# Patient Record
Sex: Female | Born: 1965 | Hispanic: Yes | Marital: Married | Smoking: Never smoker
Health system: Southern US, Community
[De-identification: ages and names within clinical notes are randomized; demographics above are authoritative.]

## PROBLEM LIST (undated history)

## (undated) DIAGNOSIS — E785 Hyperlipidemia, unspecified: Secondary | ICD-10-CM

## (undated) DIAGNOSIS — E119 Type 2 diabetes mellitus without complications: Secondary | ICD-10-CM

## (undated) DIAGNOSIS — M1990 Unspecified osteoarthritis, unspecified site: Secondary | ICD-10-CM

## (undated) DIAGNOSIS — Z789 Other specified health status: Secondary | ICD-10-CM

## (undated) DIAGNOSIS — K219 Gastro-esophageal reflux disease without esophagitis: Secondary | ICD-10-CM

## (undated) HISTORY — DX: Gastro-esophageal reflux disease without esophagitis: K21.9

## (undated) HISTORY — DX: Unspecified osteoarthritis, unspecified site: M19.90

## (undated) HISTORY — DX: Hyperlipidemia, unspecified: E78.5

## (undated) HISTORY — DX: Other specified health status: Z78.9

## (undated) HISTORY — PX: NO PAST SURGERIES: SHX2092

---

## 2003-08-20 ENCOUNTER — Ambulatory Visit (HOSPITAL_COMMUNITY): Admission: RE | Admit: 2003-08-20 | Discharge: 2003-08-20 | Payer: Self-pay | Admitting: *Deleted

## 2004-01-16 ENCOUNTER — Inpatient Hospital Stay (HOSPITAL_COMMUNITY): Admission: AD | Admit: 2004-01-16 | Discharge: 2004-01-18 | Payer: Self-pay | Admitting: Obstetrics & Gynecology

## 2005-02-02 ENCOUNTER — Emergency Department (HOSPITAL_COMMUNITY): Admission: EM | Admit: 2005-02-02 | Discharge: 2005-02-02 | Payer: Self-pay | Admitting: Emergency Medicine

## 2006-02-21 ENCOUNTER — Ambulatory Visit (HOSPITAL_COMMUNITY): Admission: RE | Admit: 2006-02-21 | Discharge: 2006-02-21 | Payer: Self-pay | Admitting: *Deleted

## 2006-03-18 ENCOUNTER — Ambulatory Visit (HOSPITAL_COMMUNITY): Admission: RE | Admit: 2006-03-18 | Discharge: 2006-03-18 | Payer: Self-pay | Admitting: Obstetrics & Gynecology

## 2006-03-20 ENCOUNTER — Emergency Department (HOSPITAL_COMMUNITY): Admission: EM | Admit: 2006-03-20 | Discharge: 2006-03-20 | Payer: Self-pay | Admitting: Emergency Medicine

## 2006-07-28 ENCOUNTER — Ambulatory Visit: Payer: Self-pay | Admitting: Obstetrics and Gynecology

## 2006-07-28 ENCOUNTER — Inpatient Hospital Stay (HOSPITAL_COMMUNITY): Admission: AD | Admit: 2006-07-28 | Discharge: 2006-07-28 | Payer: Self-pay | Admitting: Obstetrics and Gynecology

## 2006-08-04 ENCOUNTER — Inpatient Hospital Stay (HOSPITAL_COMMUNITY): Admission: AD | Admit: 2006-08-04 | Discharge: 2006-08-04 | Payer: Self-pay | Admitting: Gynecology

## 2006-08-09 ENCOUNTER — Inpatient Hospital Stay (HOSPITAL_COMMUNITY): Admission: AD | Admit: 2006-08-09 | Discharge: 2006-08-11 | Payer: Self-pay | Admitting: Obstetrics and Gynecology

## 2006-08-09 ENCOUNTER — Ambulatory Visit: Payer: Self-pay | Admitting: Obstetrics and Gynecology

## 2008-02-15 ENCOUNTER — Ambulatory Visit (HOSPITAL_COMMUNITY): Admission: RE | Admit: 2008-02-15 | Discharge: 2008-02-15 | Payer: Self-pay | Admitting: Family Medicine

## 2009-02-28 ENCOUNTER — Ambulatory Visit (HOSPITAL_COMMUNITY): Admission: RE | Admit: 2009-02-28 | Discharge: 2009-02-28 | Payer: Self-pay | Admitting: Obstetrics & Gynecology

## 2010-04-01 ENCOUNTER — Ambulatory Visit (HOSPITAL_COMMUNITY): Admission: RE | Admit: 2010-04-01 | Discharge: 2010-04-01 | Payer: Self-pay | Admitting: Obstetrics & Gynecology

## 2011-04-12 ENCOUNTER — Other Ambulatory Visit: Payer: Self-pay | Admitting: Family Medicine

## 2011-04-12 ENCOUNTER — Other Ambulatory Visit: Payer: Self-pay | Admitting: Obstetrics & Gynecology

## 2011-04-12 DIAGNOSIS — Z1231 Encounter for screening mammogram for malignant neoplasm of breast: Secondary | ICD-10-CM

## 2011-04-23 ENCOUNTER — Ambulatory Visit (HOSPITAL_COMMUNITY): Payer: Self-pay

## 2011-04-28 ENCOUNTER — Ambulatory Visit (HOSPITAL_COMMUNITY)
Admission: RE | Admit: 2011-04-28 | Discharge: 2011-04-28 | Disposition: A | Discharge status: Home / Self Care | Payer: Self-pay | Source: Ambulatory Visit | Attending: Family Medicine | Admitting: Family Medicine

## 2011-04-28 DIAGNOSIS — Z1231 Encounter for screening mammogram for malignant neoplasm of breast: Secondary | ICD-10-CM

## 2012-06-14 ENCOUNTER — Other Ambulatory Visit (HOSPITAL_COMMUNITY): Payer: Self-pay | Admitting: Physician Assistant

## 2012-06-14 DIAGNOSIS — Z1231 Encounter for screening mammogram for malignant neoplasm of breast: Secondary | ICD-10-CM

## 2012-06-27 ENCOUNTER — Ambulatory Visit (HOSPITAL_COMMUNITY)
Admission: RE | Admit: 2012-06-27 | Discharge: 2012-06-27 | Disposition: A | Discharge status: Home / Self Care | Payer: Self-pay | Source: Ambulatory Visit | Attending: Physician Assistant | Admitting: Physician Assistant

## 2012-06-27 DIAGNOSIS — Z1231 Encounter for screening mammogram for malignant neoplasm of breast: Secondary | ICD-10-CM

## 2013-06-20 ENCOUNTER — Other Ambulatory Visit (HOSPITAL_COMMUNITY): Payer: Self-pay | Admitting: Nurse Practitioner

## 2013-06-20 DIAGNOSIS — Z1231 Encounter for screening mammogram for malignant neoplasm of breast: Secondary | ICD-10-CM

## 2013-07-05 ENCOUNTER — Ambulatory Visit (HOSPITAL_COMMUNITY)
Admission: RE | Admit: 2013-07-05 | Discharge: 2013-07-05 | Disposition: A | Discharge status: Home / Self Care | Payer: Self-pay | Source: Ambulatory Visit | Attending: Nurse Practitioner | Admitting: Nurse Practitioner

## 2013-07-05 DIAGNOSIS — Z1231 Encounter for screening mammogram for malignant neoplasm of breast: Secondary | ICD-10-CM

## 2013-11-24 ENCOUNTER — Encounter: Payer: Self-pay | Admitting: Internal Medicine

## 2013-11-24 ENCOUNTER — Ambulatory Visit: Payer: No Typology Code available for payment source | Attending: Internal Medicine | Admitting: Internal Medicine

## 2013-11-24 VITALS — BP 102/65 | HR 67 | Temp 98.1°F | Ht 61.0 in | Wt 181.0 lb

## 2013-11-24 DIAGNOSIS — Z0000 Encounter for general adult medical examination without abnormal findings: Secondary | ICD-10-CM

## 2013-11-24 DIAGNOSIS — M7918 Myalgia, other site: Secondary | ICD-10-CM

## 2013-11-24 DIAGNOSIS — K029 Dental caries, unspecified: Secondary | ICD-10-CM | POA: Insufficient documentation

## 2013-11-24 DIAGNOSIS — K047 Periapical abscess without sinus: Principal | ICD-10-CM

## 2013-11-24 DIAGNOSIS — M62830 Muscle spasm of back: Secondary | ICD-10-CM | POA: Insufficient documentation

## 2013-11-24 DIAGNOSIS — H539 Unspecified visual disturbance: Secondary | ICD-10-CM | POA: Insufficient documentation

## 2013-11-24 NOTE — Progress Notes (Unsigned)
Patient ID: Elaine Burch, female   DOB: 05-10-1966, 48 y.o.   MRN: [id number]   HPI: Elaine Burch is a 48 y.o. female presenting on 11/24/2013 presenting to establish care. Please see ROS.     No past medical history on file.  No past surgical history on file.  No current outpatient prescriptions on file.   No current facility-administered medications for this visit.    No Known Allergies  No family history on file.  History   Social History  . Marital Status: Married    Spouse Name: N/A    Number of Children: N/A  . Years of Education: N/A   Occupational History  . Not on file.   Social History Main Topics  . Smoking status: Never Smoker   . Smokeless tobacco: Not on file  . Alcohol Use: Not on file  . Drug Use: Not on file  . Sexual Activity: Not on file   Other Topics Concern  . Works as a Chartered certified accountant   Social History Narrative  . No narrative on file    Review of Systems  Review of Systems  Constitutional: Negative for fever, chills, diaphoresis, activity change, appetite change and fatigue.  HENT: Negative for ear pain, nosebleeds, congestion, facial swelling, rhinorrhea, neck pain, neck stiffness and ear discharge.  Eyes: Negative for pain, discharge, redness, itching and visual disturbance.  Respiratory: Negative for cough, choking, chest tightness, shortness of breath, wheezing and stridor.  Cardiovascular: Negative for chest pain, palpitations and leg swelling.  Gastrointestinal: Negative for abdominal distention, vomiting, diarrhea  + occasional consitpation Genitourinary: Negative for dysuria, urgency, frequency, hematuria, flank pain, decreased urine volume, difficulty urinating and dyspareunia.  Musculoskeletal:+ for back and shoulder pain (b/l) and right buttock pain, joint swelling, arthralgias or gait problem.  Neurological: Negative for dizziness, tremors, seizures, syncope, facial asymmetry, speech difficulty, weakness,  light-headedness, numbness and headaches.  Hematological: Negative for adenopathy. Does not bruise/bleed easily.  Psychiatric/Behavioral: Negative for hallucinations, behavioral problems, confusion, dysphoric mood   Objective:  BP 102/65  Pulse 67  Temp(Src) 98.1 F (36.7 C) (Oral)  Ht 5\' 1"  (1.549 m)  Wt 181 lb (82.101 kg)  BMI 34.22 kg/m2  LMP 11/05/2013 Filed Weights   11/24/13 1008  Weight: 181 lb (82.101 kg)     Physical Exam  Constitutional: obese- No distress. HENT: Normocephalic. External right and left ear normal. Oropharynx is clear and moist.  Eyes: Conjunctivae and EOM are normal. PERRLA, no scleral icterus.  Neck: Normal ROM. Neck supple. No JVD. No tracheal deviation. No thyromegaly.  CVS: RRR, S1/S2 +, no murmurs, no gallops, no carotid bruit.  Pulmonary: Effort and breath sounds normal, no stridor, rhonchi, wheezes, rales.  Abdominal: Soft. BS +,  no distension, tenderness, rebound or guarding.  Musculoskeletal: Normal range of motion. No edema and no tenderness.  Neuro: Alert. Normal reflexes, muscle tone coordination. No cranial nerve deficit. Skin: Skin is warm and dry. No rash noted. Not diaphoretic. No erythema. No pallor.  Psychiatric: Normal mood and affect. Behavior, judgment, thought content normal.   No results found for this basename: WBC,  HGB,  HCT,  MCV,  PLT   No results found for this basename: CREATININE,  BUN,  NA,  K,  CL,  CO2    No results found for this basename: HGBA1C   Lipid Panel  No results found for this basename: chol,  trig,  hdl,  cholhdl,  vldl,  ldlcalc        Patient Active Problem  List   Diagnosis Date Noted  . Muscle spasm of back 11/24/2013  . Dental caries 11/24/2013  . Visual changes 11/24/2013     Preventative Medicine:  No health maintenance topics applied.  No health maintenance topics applied. Mammogram: completed in past yr Colonoscopy : Pap Smear : completed in past yr Flu vaccine: will  administer  LAB WORK: ordered Metabolic panel: CBC:  Vitamin D : Lipid Panel: TSH: PSA:    Assessment and plan:  dental carries - dental referral  Muscle spasm of back/ right gluteal/ and pain in shoulders - PT, Alleve, Ortho referral for possible injection for right gluteal region  Visual changes - opthal referral  Routine history and physical examination of adult    Return in about 3 months (around 02/21/2014).   Elaine Odea, MD

## 2013-11-24 NOTE — Patient Instructions (Signed)
Alleve- for muscle pains  Calambres y espasmos musculares  (Muscle Cramps and Spasms)  Se denominan calambres y espasmos musculares cuando los msculos se comprimen. Generalmente mejoran en unos minutos. Los calambres musculares son dolorosos. Son ms fuertes y duran ms que los espasmos musculares. Los espasmos pueden o no ser dolorosos. Pueden durar algunos segundos o mucho ms. CUIDADOS EN EL HOGAR   Beba gran cantidad de lquido para mantener el pis (orina) de tono claro o amarillo plido.  [place], estire y relaje el msculo.  Aplique una toalla hmeda, una almohadilla trmica o moje con agua tibia los msculos acalambrados.  Coloque una bolsa de hielo sobre la zona si le duele o est inflamada.  Ponga el hielo en una bolsa plstica.  Colquese una toalla entre la piel y la bolsa de hielo.  Deje el hielo en el lugar durante 15 a 20 minutos, 3 a 4 veces por da.  Slo tome los medicamentos que le indique el mdico. SOLICITE AYUDA DE INMEDIATO SI:  Los calambres o espasmos empeoran, ocurren con ms frecuencia o no mejoran con [employer].  ASEGRESE DE QUE:   Comprende estas instrucciones.  Controlar su enfermedad.  Solicitar ayuda de inmediato si no mejora o si empeora. Document Released: 12/17/2008 Document Revised: 01/15/2013 [facility] Patient Information 2014 [place], [place].

## 2013-11-26 ENCOUNTER — Ambulatory Visit: Payer: No Typology Code available for payment source | Attending: Internal Medicine

## 2013-11-26 DIAGNOSIS — H539 Unspecified visual disturbance: Secondary | ICD-10-CM

## 2013-11-26 DIAGNOSIS — M62830 Muscle spasm of back: Secondary | ICD-10-CM

## 2013-11-26 DIAGNOSIS — K047 Periapical abscess without sinus: Principal | ICD-10-CM

## 2013-11-26 DIAGNOSIS — Z0000 Encounter for general adult medical examination without abnormal findings: Secondary | ICD-10-CM

## 2013-11-26 DIAGNOSIS — M7918 Myalgia, other site: Secondary | ICD-10-CM

## 2013-11-26 DIAGNOSIS — K029 Dental caries, unspecified: Secondary | ICD-10-CM

## 2013-11-26 LAB — TSH: TSH: 3.552 u[IU]/mL (ref 0.350–4.500)

## 2013-11-26 LAB — COMPLETE METABOLIC PANEL WITH GFR
ALT: 12 U/L (ref 0–35)
AST: 17 U/L (ref 0–37)
Albumin: 4.2 g/dL (ref 3.5–5.2)
Alkaline Phosphatase: 62 U/L (ref 39–117)
BUN: 7 mg/dL (ref 6–23)
CO2: 24 mEq/L (ref 19–32)
Calcium: 8.7 mg/dL (ref 8.4–10.5)
Chloride: 107 mEq/L (ref 96–112)
Creat: 0.43 mg/dL — ABNORMAL LOW (ref 0.50–1.10)
GFR, Est African American: >89 mL/min
GFR, Est Non African American: >89 mL/min
Glucose, Bld: 95 mg/dL (ref 70–99)
Potassium: 4.4 mEq/L (ref 3.5–5.3)
Sodium: 139 mEq/L (ref 135–145)
Total Bilirubin: 0.3 mg/dL (ref 0.2–1.2)
Total Protein: 7.3 g/dL (ref 6.0–8.3)

## 2013-11-26 LAB — LIPID PANEL
Cholesterol: 159 mg/dL (ref 0–200)
HDL: 52 mg/dL (ref >39)
LDL Cholesterol: 75 mg/dL (ref 0–99)
Total CHOL/HDL Ratio: 3.1 Ratio
Triglycerides: 159 mg/dL — ABNORMAL HIGH (ref <150)
VLDL: 32 mg/dL (ref 0–40)

## 2013-11-27 LAB — VITAMIN D 25 HYDROXY (VIT D DEFICIENCY, FRACTURES): Vit D, 25-Hydroxy: 10 ng/mL — ABNORMAL LOW (ref 30–89)

## 2013-12-06 ENCOUNTER — Ambulatory Visit: Payer: No Typology Code available for payment source | Attending: Internal Medicine

## 2013-12-06 DIAGNOSIS — M5380 Other specified dorsopathies, site unspecified: Secondary | ICD-10-CM | POA: Insufficient documentation

## 2013-12-06 DIAGNOSIS — IMO0001 Reserved for inherently not codable concepts without codable children: Secondary | ICD-10-CM | POA: Insufficient documentation

## 2013-12-06 DIAGNOSIS — M2550 Pain in unspecified joint: Secondary | ICD-10-CM | POA: Insufficient documentation

## 2013-12-07 ENCOUNTER — Telehealth: Payer: Self-pay | Admitting: Emergency Medicine

## 2013-12-07 MED ORDER — VITAMIN D (ERGOCALCIFEROL) 1.25 MG (50000 UNIT) PO CAPS: 50000.0000 [IU] | ORAL_CAPSULE | ORAL | Status: DC

## 2013-12-07 NOTE — Progress Notes (Signed)
Quick Note:  Please let patient know that they are deficient in Vit D and prescribe 50,000 U Vit D weekly for 4 wks x 4 refills.  If they are unable to afford it, they can take Vit D2 OTC 2000 U daily. ______

## 2013-12-07 NOTE — Telephone Encounter (Signed)
Lab results with medication instructions given to pt via [place] interpretor Pt verbalized understanding. Pt to pick script at our pharmacy

## 2013-12-12 ENCOUNTER — Ambulatory Visit: Payer: No Typology Code available for payment source | Admitting: Rehabilitation

## 2013-12-13 ENCOUNTER — Ambulatory Visit: Payer: No Typology Code available for payment source | Admitting: Physical Therapy

## 2013-12-17 ENCOUNTER — Ambulatory Visit: Payer: No Typology Code available for payment source | Admitting: Physical Therapy

## 2013-12-19 ENCOUNTER — Ambulatory Visit: Payer: No Typology Code available for payment source | Admitting: Physical Therapy

## 2013-12-26 ENCOUNTER — Ambulatory Visit: Payer: No Typology Code available for payment source | Admitting: Physical Therapy

## 2013-12-27 ENCOUNTER — Encounter: Payer: No Typology Code available for payment source | Admitting: Physical Therapy

## 2013-12-27 ENCOUNTER — Ambulatory Visit: Payer: No Typology Code available for payment source | Admitting: Physical Therapy

## 2014-01-01 ENCOUNTER — Ambulatory Visit: Payer: No Typology Code available for payment source | Admitting: Physical Therapy

## 2014-01-03 ENCOUNTER — Ambulatory Visit: Payer: No Typology Code available for payment source | Attending: Internal Medicine | Admitting: Physical Therapy

## 2014-01-03 DIAGNOSIS — M2550 Pain in unspecified joint: Secondary | ICD-10-CM | POA: Insufficient documentation

## 2014-01-03 DIAGNOSIS — M5380 Other specified dorsopathies, site unspecified: Secondary | ICD-10-CM | POA: Insufficient documentation

## 2014-01-03 DIAGNOSIS — IMO0001 Reserved for inherently not codable concepts without codable children: Secondary | ICD-10-CM | POA: Insufficient documentation

## 2014-01-07 ENCOUNTER — Encounter: Payer: No Typology Code available for payment source | Admitting: Physical Therapy

## 2014-01-08 ENCOUNTER — Ambulatory Visit: Payer: No Typology Code available for payment source | Admitting: Rehabilitation

## 2014-01-10 ENCOUNTER — Encounter: Payer: No Typology Code available for payment source | Admitting: Rehabilitation

## 2014-01-15 ENCOUNTER — Ambulatory Visit: Payer: No Typology Code available for payment source | Admitting: Physical Therapy

## 2014-01-15 ENCOUNTER — Encounter: Payer: No Typology Code available for payment source | Admitting: Rehabilitation

## 2014-01-17 ENCOUNTER — Encounter: Payer: No Typology Code available for payment source | Admitting: Rehabilitation

## 2014-01-17 ENCOUNTER — Encounter: Payer: No Typology Code available for payment source | Admitting: Physical Therapy

## 2014-01-17 ENCOUNTER — Ambulatory Visit: Payer: No Typology Code available for payment source | Admitting: Rehabilitation

## 2014-05-09 ENCOUNTER — Ambulatory Visit: Payer: Self-pay | Attending: Internal Medicine

## 2014-06-24 ENCOUNTER — Other Ambulatory Visit (HOSPITAL_COMMUNITY): Payer: Self-pay | Admitting: Nurse Practitioner

## 2014-06-24 DIAGNOSIS — Z1231 Encounter for screening mammogram for malignant neoplasm of breast: Secondary | ICD-10-CM

## 2014-07-11 ENCOUNTER — Ambulatory Visit (HOSPITAL_COMMUNITY)
Admission: RE | Admit: 2014-07-11 | Discharge: 2014-07-11 | Disposition: A | Discharge status: Home / Self Care | Payer: Self-pay | Source: Ambulatory Visit | Attending: Nurse Practitioner | Admitting: Nurse Practitioner

## 2014-07-11 DIAGNOSIS — Z1231 Encounter for screening mammogram for malignant neoplasm of breast: Secondary | ICD-10-CM

## 2014-11-13 ENCOUNTER — Ambulatory Visit: Payer: Self-pay | Attending: Internal Medicine | Admitting: Internal Medicine

## 2014-11-13 ENCOUNTER — Encounter: Payer: Self-pay | Admitting: Internal Medicine

## 2014-11-13 VITALS — BP 99/65 | HR 63 | Temp 98.1°F | Resp 16 | Ht 61.0 in | Wt 182.0 lb

## 2014-11-13 DIAGNOSIS — Z0000 Encounter for general adult medical examination without abnormal findings: Secondary | ICD-10-CM

## 2014-11-13 DIAGNOSIS — Z01419 Encounter for gynecological examination (general) (routine) without abnormal findings: Secondary | ICD-10-CM | POA: Insufficient documentation

## 2014-11-13 DIAGNOSIS — M25552 Pain in left hip: Secondary | ICD-10-CM | POA: Insufficient documentation

## 2014-11-13 DIAGNOSIS — M549 Dorsalgia, unspecified: Secondary | ICD-10-CM | POA: Insufficient documentation

## 2014-11-13 NOTE — Progress Notes (Signed)
Patient ID: Elaine Burch, female   DOB: 13-Sep-1966, 49 y.o.   MRN: [id number]  CC: annual physical  HPI: Elaine Burch is a 49 y.o. female here today for a follow up visit.  Patient has no past medical history.  Last mammogram was 07/2014.  No concerns with breast. No vaginal discharge, itch, odor, lesions. Has a bump on inside of vagina that is non painful, pruritic, or draining.   Pain on left side of hip, more painful with laying on side at night. Has had physical therapy for one year. Has tried ibuprofen for the pain. Upper back pain (in b/w) shoulders. Pain present for several months. No injury noted.   Patient has No headache, No chest pain, No abdominal pain - No Nausea, No new weakness tingling or numbness, No Cough - SOB.  No Known Allergies History reviewed. No pertinent past medical history. Current Outpatient Prescriptions on File Prior to Visit  Medication Sig Dispense Refill  . Vitamin D, Ergocalciferol, (DRISDOL) 50000 UNITS CAPS capsule Take 1 capsule (50,000 Units total) by mouth every 7 (seven) days. (Patient not taking: Reported on 11/13/2014) 4 capsule 2   No current facility-administered medications on file prior to visit.   Family History  Problem Relation Age of Onset  . Diabetes Mother   . Cancer Father    History   Social History  . Marital Status: Married    Spouse Name: N/A  . Number of Children: N/A  . Years of Education: N/A   Occupational History  . Not on file.   Social History Main Topics  . Smoking status: Never Smoker   . Smokeless tobacco: Not on file  . Alcohol Use: Not on file  . Drug Use: Not on file  . Sexual Activity: Not on file   Other Topics Concern  . Not on file   Social History Narrative    Review of Systems: All systems negative except for notes in HPI   Objective:   Filed Vitals:   11/13/14 1534  BP: 99/65  Pulse: 63  Temp: 98.1 F (36.7 C)  Resp: 16    Physical Exam: Constitutional:  Patient appears well-developed and well-nourished. No distress. HENT: Normocephalic, atraumatic, External right and left ear normal. Oropharynx is clear and moist.  Eyes: Conjunctivae and EOM are normal. PERRLA, no scleral icterus. Neck: Normal ROM. Neck supple. No JVD. No tracheal deviation. No thyromegaly. CVS: RRR, S1/S2 +, no murmurs, no gallops, no carotid bruit.  Pulmonary: Effort and breath sounds normal, no stridor, rhonchi, wheezes, rales.  Abdominal: Soft. BS +,  no distension, tenderness, rebound or guarding.  Musculoskeletal: Normal range of motion. No edema and no tenderness.  Lymphadenopathy: No lymphadenopathy noted, cervical, inguinal or axillary Neuro: Alert. Normal reflexes, muscle tone coordination. No cranial nerve deficit. Skin: Skin is warm and dry. No rash noted. Not diaphoretic. No erythema. No pallor. Psychiatric: Normal mood and affect. Behavior, judgment, thought content normal. Physical Exam  Pulmonary/Chest: Right breast exhibits no mass. Left breast exhibits no mass.  Genitourinary: Vagina normal and uterus normal. No breast tenderness or discharge. Cervix exhibits no motion tenderness, no discharge and no friability. Right adnexum displays no tenderness. Left adnexum displays no tenderness.  Lymphadenopathy:       Right: No inguinal adenopathy present.       Left: No inguinal adenopathy present.     No results found for: WBC, HGB, HCT, MCV, PLT Lab Results  Component Value Date   CREATININE 0.43* 11/26/2013   BUN  7 11/26/2013   NA 139 11/26/2013   K 4.4 11/26/2013   CL 107 11/26/2013   CO2 24 11/26/2013    No results found for: HGBA1C Lipid Panel     Component Value Date/Time   CHOL 159 11/26/2013 0903   TRIG 159* 11/26/2013 0903   HDL 52 11/26/2013 0903   CHOLHDL 3.1 11/26/2013 0903   VLDL 32 11/26/2013 0903   LDLCALC 75 11/26/2013 0903       Assessment and plan:   Jaydan was seen today for establish care.  Diagnoses and all orders for  this visit:  Annual physical exam Orders: -     Cytology - PAP Heard -     Cervicovaginal ancillary only -     Lipid panel; Future -     COMPLETE METABOLIC PANEL WITH GFR; Future -     CBC; Future -     Hemoglobin A1C; Future -     HIV antibody (with reflex); Future -     RPR; Future   Due to language barrier, an interpreter was present during the history-taking and subsequent discussion (and for part of the physical exam) with this patient.  Return if symptoms worsen or fail to improve.        Chari Manning, NP-C [facility] [phone] 11/13/2014, 4:04 PM

## 2014-11-13 NOTE — Patient Instructions (Signed)
Clarinda (Health Maintenance) Adoptar un estilo de vida saludable y recibir atencin preventiva pueden ser de suma utilidad para promover la salud y Musician. Hable con el mdico para saber cul es el esquema de exmenes peridicos adecuado para usted. Esta es una buena oportunidad para Teacher, adult education peridicamente al mdico sobre cmo prevenir enfermedades y [place] sano. Entre cada control mdico, hay muchas cosas que puede hacer por s solo. Los expertos han investigado mucho acerca de los cambios en el estilo de vida y las medidas preventivas que muy probablemente preserven su salud. Consulte al mdico para obtener ms informacin. EL PESO Y LA DIETA  Consuma una dieta saludable.  Incluya abundante cantidad de verduras, frutas, productos lcteos descremados y protenas magras.  No coma muchos alimentos con alto contenido de grasas slidas, azcares agregados o sal.  Realice actividad fsica con regularidad. Esta es una de las cosas ms importantes que puede hacer por su salud.  La [employer] de las personas adultas deben hacer actividad fsica durante por lo menos 140mnutos semanales. El ejercicio debe aumentar la frecuencia cardaca y hNature conservation officersudar (ejercicio de intensidad moderada).  Adems, casi todos los adultos deben hacer ejercicios de fortalecimiento al mToysRusveces por semana como complemento del ejercicio de iDante Mantenga un peso saludable.  El ndice de masa corporal ([facility] es una medida que puede usarse para identificar posibles problemas relacionados con el peso. Este ofrece un clculo estimativo de la gAir traffic controlleren funcin del peso y lAgricultural consultant El mdico puede determinar su IEncino Outpatient Surgery Center LLCy ayudarlo a aScience writery mTheatre managerun peso saludable.  Para las mujeres mayores de 20aos:  Un ISurgicare Surgical Associates Of Mahwah LLCmenor de 18,5 se considera bajo peso.  Un IBaptist Health Medical Center - North Little Rockentre 18,5 y 24,9 es normal.  Un ITerrell State Hospitalentre 25 y 29,9 es sobrepeso.  Un IMC de 30 o ms se considera  obesidad. Controlar los niveles de colesterol y lpidos en la sangre  Debe comenzar a hacerse anlisis de sangre para controlar los nAscutneyde lpidos y colesterol a partir de lSedalia y repetir estos estudios cada 5aos.  Tal vez deba someterse a controles de los niveles de colesterol con ms frecuencia si:  Tiene los niveles de lpidos o colesterol elevados.  Es mayor de 50aos.  Tiene un riesgo alto de tener enfermedades cardacas. DETECCIN DE CNCER  Cncer de pulmn  Se recomienda realizar exmenes de deteccin de cncer de pulmn a las personas adultas que tienen entre 581y 80aos, y corren riesgo de tBest boycncer de pulmn debido a sus antecedentes de tabaquismo.  Se recomienda realizar una tomografa computarizada anual de baja dosis de los pulmones a las personas que:  Siguen fumando.  Hayan dejado de fumar en los ltimos 15aos.  Hayan fumado un paquete diario durante 30aos. El ndice ao-paquete equivale a fumar, en promedio, un paquete de cigarrillos diario durante 1ao.  Debe seguir realizndose estudios de deteccin anual hasta que hayan pasado 15aos desde que dej de fumar.  Estos estudios deben suspenderse si tiene un problema de salud que le impedira recibir tratamiento para eScience writerde pulmn. Cncer de mama  Ponga en prctica la "autoconciencia de las mamas". Esto significa reconocer la apariencia normal de las mamas y cSt. [place]airs[place]  Adems implica realizarse autoexmenes peridicos de lJohnson & Johnson Informe al mdico si hay algn cambio, sin importar cun pequeo sea.  Si tiene entre 20 y 30aos, un mdico debe hacerle un examen clnico de las mamas cada 1 a 316aoscomo parte del  examen habitual de salud.  Si es mayor de 40aos, debe Electrical engineer un examen clnico de las Microsoft. Tambin debe considerar la posibilidad de realizarse una radiografa de las mamas ([place]) todos los [place].  Si tiene antecedentes familiares de cncer de  mama, hable con el mdico para saber si debe someterse a un estudio gentico.  Si tiene un riesgo alto de Animal nutritionist de mama, hable con el mdico para saber si debe hacerse una resonancia magntica y [employer].  Se recomienda una evaluacin del gen del cncer de mama (BRCA) a las mujeres que tengan familiares con tumores malignos relacionados con el BRCA. Los tumores malignos relacionados con elBRCA incluyen:  [employer].  Los [employer].  Los tumores malignos del peritoneo.  Los resultados de la evaluacin determinarn la necesidad de asesoramiento gentico y de [place] de BRCA1 y BRCA2. Cncer de cuello uterino Ya no se recomiendan los exmenes plvicos de rutina para la deteccin del cncer de cuello uterino en las mujeres que no estn embarazadas que son consideradas sujetos de bajo riesgo de Best boy cncer de los rganos de la pelvis (ovarios, tero y vagina) y que no tienen sntomas. Tal vez sea necesario realizar un examen plvico si tiene sntomas, incluidos aquellos que estn asociados con infecciones en la pelvis. Pregntele al mdico si un examen plvico de deteccin es adecuado para usted.   El Papanicolau es la prueba de deteccin del cncer de cuello uterino para las mujeres que podran Engineer, production.  Si le han realizado una histerectoma por un problema que no era cncer u otra enfermedad que podra causar cncer, ya no necesitar realizarse pruebas de Papanicolaou.  Si es mayor de 39aos y los resultados de las pruebas de Papanicolaou han sido normales durante los ltimos 10aos, ya no es necesario que se realice estos estudios.  Si ha recibido un tratamiento para el cncer de cuello uterino o para una enfermedad que podra causar cncer, necesitar realizarse una prueba de Papanicolaou y controles durante al menos 26 aos de concluido el [place].  Si ya no se realiza pruebas de Papanicolaou, debe evaluar sus factores de riesgo si estos  se modifican (por ejemplo, tiene un nuevo compaero sexual). Esta situacin puede influir en la necesidad de que se someta nuevamente a estudios de deteccin.  Algunas mujeres sufren problemas mdicos que aumentan la probabilidad de tener cncer de cuello uterino. En este caso, el mdico podr indicarle que se someta a exmenes de deteccin y pruebas de Papanicolaou con ms frecuencia.  La prueba del virus del Engineer, technical sales (VPH) es un estudio adicional que puede usarse para la deteccin del cncer de cuello uterino. Esta prueba busca la presencia del virus que puede causar cambios celulares en el cuello del tero. Las clulas que se recolectan durante la prueba de Papanicolaou pueden usarse para el VPH.  La prueba del VPH puede usarse para examinar a las Cendant Corporation de 46TKP. Someterse a International aid/development worker del VPH puede prolongar el [employer] las pruebas de Papanicolaou normales de tres a Product manager.  Adems, se debe realizar la prueba del VPH para evaluar a las mujeres de cualquier edad cuyos resultados del Papanicolau no sean claros.  Despus de los 30aos, las mujeres deben realizarse pruebas del VPH con la misma frecuencia que las pruebas de Papanicolau. Cncer colorrectal  Es posible detectar este tipo de cncer y, a menudo, es posible prevenirlo.  Generalmente, los estudios de deteccin de  rutina del cncer colorrectal empiezan a hacerse a partir de los 50aos y continan hasta los 75aos.  El mdico puede recomendar que se los haga antes, si tiene factores de riesgo de cncer de colon.  Adems, el mdico puede recomendar que use un kit de prueba casera para hallar sangre oculta en la materia fecal.  Es posible que se use una pequea cmara en el extremo de un tubo para examinar directamente el colon (sigmoidoscopa o colonoscopa), con el fin de detectar las formas ms incipientes de cncer colorrectal.  Generalmente, los estudios de deteccin de rutina se realizan  a partir de los 50aos.  El examen directo del colon debe repetirse cada 5 a 10aos hasta cumplir 75aos. Sin embargo, tal vez deba someterse a la prueba de deteccin con ms frecuencia si se encuentran formas incipientes de plipos precancerosos o pequeos tumores. Cncer de piel  Revsese la piel peridicamente desde los dedos de los pies hasta la cabeza.  Informe al mdico si aparecen nuevos lunares o si nota cambios en los que ya tiene, especialmente en la forma o el color.  Tambin notifquele si tiene un lunar cuyo tamao es ms grande que el de la goma de un lpiz.  Use siempre pantalla solar. Aplique pantalla solar tantas veces como pueda a lo largo del da.  Protjase usando mangas y pantalones largos, un sombrero de ala ancha y gafas para el sol todo el ao, siempre que est al aire libre. ENFERMEDADES CARDACAS, DIABETES E HIPERTENSIN ARTERIAL   Debe controlarse la presin arterial al menos cada 1 o 2aos. La hipertensin arterial causa enfermedades cardacas y aumenta el riesgo de ictus.  Si tiene entre 55 y 79aos, consulte al mdico si debe tomar aspirina para prevenir ictus.  Hgase anlisis peridicos para la diabetes, que incluyen la toma de una muestra de sangre para controlar el nivel de azcar en la sangre mientras est en ayunas.  Si su peso es normal y tiene un riesgo bajo de tener diabetes, hgase este anlisis una vez cada tres aos, despus de los 45aos.  Si tiene sobrepeso y un riesgo alto de sufrir diabetes, considere la posibilidad de hacerse los anlisis a una edad ms temprana o con ms frecuencia. PREVENCIN DE INFECCIONES  Hepatitis B  Si tiene un riesgo ms alto de tener hepatitisB, debe hacerse anlisis de deteccin de este virus. Se considera que tiene un alto riesgo de hepatitis B si:  Naci en un pas donde la hepatitisB es frecuente. Pregntele al mdico qu pases son considerados de alto riesgo.  Sus padres nacieron en un pas de alto  riesgo, y usted no recibi la vacuna contra la hepatitis B.  Tiene VIH o sida.  Usa agujas para inyectarse drogas.  Convive con una persona que tiene hepatitisB.  Tuvo relaciones sexuales con una persona que tiene hepatitisB.  Recibe tratamiento de hemodilisis.  Toma ciertos medicamentos para cuadros clnicos tales como cncer, trasplante de  

## 2014-11-13 NOTE — Progress Notes (Signed)
Pt is here today for a pap smear and a physical. Pt reports having chronic upper back pain.

## 2014-11-15 LAB — CERVICOVAGINAL ANCILLARY ONLY
Chlamydia: NEGATIVE
Neisseria Gonorrhea: NEGATIVE
WET PREP (BD AFFIRM): NEGATIVE
Wet Prep (BD Affirm): NEGATIVE
Wet Prep (BD Affirm): POSITIVE — AB

## 2014-11-15 LAB — CYTOLOGY - PAP

## 2014-11-19 ENCOUNTER — Telehealth: Payer: Self-pay | Admitting: *Deleted

## 2014-11-19 ENCOUNTER — Ambulatory Visit: Payer: Self-pay | Attending: Internal Medicine

## 2014-11-19 DIAGNOSIS — Z0000 Encounter for general adult medical examination without abnormal findings: Secondary | ICD-10-CM

## 2014-11-19 DIAGNOSIS — Z23 Encounter for immunization: Secondary | ICD-10-CM

## 2014-11-19 LAB — CBC
HCT: 31.3 % — ABNORMAL LOW (ref 36.0–46.0)
Hemoglobin: 9.2 g/dL — ABNORMAL LOW (ref 12.0–15.0)
MCH: 20 pg — AB (ref 26.0–34.0)
MCHC: 29.4 g/dL — ABNORMAL LOW (ref 30.0–36.0)
MCV: 67.9 fL — AB (ref 78.0–100.0)
Platelets: 299 10*3/uL (ref 150–400)
RBC: 4.61 MIL/uL (ref 3.87–5.11)
RDW: 19.1 % — ABNORMAL HIGH (ref 11.5–15.5)
WBC: 4.7 10*3/uL (ref 4.0–10.5)

## 2014-11-19 MED ORDER — FLUCONAZOLE 150 MG PO TABS: 150.0000 mg | ORAL_TABLET | Freq: Every day | ORAL | Status: DC

## 2014-11-19 NOTE — Telephone Encounter (Signed)
-----   Message from Lance Bosch, NP sent at 11/15/2014 10:56 PM EST ----- Positive for yeast. Please send diflucan 150 mg to take PO once. Send 1 tablet with 1 refill. May repeat in one week if no improvement. Negative for STD's

## 2014-11-19 NOTE — Telephone Encounter (Signed)
-----   Message from Lance Bosch, NP sent at 11/18/2014  6:35 PM EST ----- Patient pap is negative for malignancies. Will repeat in 3 years.

## 2014-11-19 NOTE — Telephone Encounter (Signed)
Rx send to [facility] Left voice message to return call. (message in [place])

## 2014-11-28 ENCOUNTER — Telehealth: Payer: Self-pay | Admitting: *Deleted

## 2014-11-28 MED ORDER — FERROUS SULFATE 325 (65 FE) MG PO TABS: 325.0000 mg | ORAL_TABLET | Freq: Every day | ORAL | Status: DC

## 2014-11-28 NOTE — Telephone Encounter (Signed)
Rx send to [place] Left voice message to return call

## 2014-11-28 NOTE — Telephone Encounter (Signed)
-----   Message from Lance Bosch, NP sent at 11/25/2014 10:46 AM EST ----- Iron deficiency anemia. Please advise patient to take ferrous sulfate 325 mg PO once daily to help with anemia. May send prescription with 2 refills.

## 2014-11-29 ENCOUNTER — Ambulatory Visit: Payer: Self-pay | Attending: Internal Medicine

## 2014-11-29 ENCOUNTER — Telehealth: Payer: Self-pay | Admitting: General Practice

## 2014-11-29 NOTE — Telephone Encounter (Signed)
Patient request lab results from last visit. Please contact patient to assist.

## 2014-12-02 ENCOUNTER — Telehealth: Payer: Self-pay | Admitting: Internal Medicine

## 2014-12-02 ENCOUNTER — Other Ambulatory Visit: Payer: Self-pay | Admitting: *Deleted

## 2014-12-02 MED ORDER — FERROUS SULFATE 325 (65 FE) MG PO TABS: 325.0000 mg | ORAL_TABLET | Freq: Every day | ORAL | Status: DC

## 2014-12-02 NOTE — Telephone Encounter (Signed)
Rx reorder pharmacy change to [facility]

## 2014-12-02 NOTE — Telephone Encounter (Signed)
Pt aware of lab results        Notes Recorded by Lance Bosch, NP on 11/25/2014 at 10:46 AM Iron deficiency anemia. Please advise patient to take ferrous sulfate 325 mg PO once daily to help with anemia. May send prescription with 2 refills.

## 2014-12-02 NOTE — Telephone Encounter (Signed)
Patient called stating that her Iron medication was not sent to the pharmacy patient states that she called and the pharmacy stated that they did not have any medications ready for her.

## 2015-06-25 ENCOUNTER — Ambulatory Visit: Payer: Self-pay | Attending: Internal Medicine

## 2015-08-06 ENCOUNTER — Other Ambulatory Visit: Payer: Self-pay

## 2015-08-06 DIAGNOSIS — Z1231 Encounter for screening mammogram for malignant neoplasm of breast: Secondary | ICD-10-CM

## 2015-08-22 ENCOUNTER — Ambulatory Visit
Admission: RE | Admit: 2015-08-22 | Discharge: 2015-08-22 | Disposition: A | Discharge status: Home / Self Care | Payer: No Typology Code available for payment source | Source: Ambulatory Visit

## 2015-08-22 DIAGNOSIS — Z1231 Encounter for screening mammogram for malignant neoplasm of breast: Secondary | ICD-10-CM

## 2016-08-03 ENCOUNTER — Other Ambulatory Visit: Payer: Self-pay | Admitting: Internal Medicine

## 2016-08-17 ENCOUNTER — Other Ambulatory Visit (HOSPITAL_COMMUNITY): Payer: Self-pay | Admitting: *Deleted

## 2016-08-17 DIAGNOSIS — N644 Mastodynia: Secondary | ICD-10-CM

## 2016-09-09 ENCOUNTER — Ambulatory Visit
Admission: RE | Admit: 2016-09-09 | Discharge: 2016-09-09 | Disposition: A | Discharge status: Home / Self Care | Payer: No Typology Code available for payment source | Source: Ambulatory Visit | Attending: Obstetrics and Gynecology | Admitting: Obstetrics and Gynecology

## 2016-09-09 ENCOUNTER — Ambulatory Visit (HOSPITAL_COMMUNITY)
Admission: RE | Admit: 2016-09-09 | Discharge: 2016-09-09 | Disposition: A | Discharge status: Home / Self Care | Payer: Self-pay | Source: Ambulatory Visit | Attending: Obstetrics and Gynecology | Admitting: Obstetrics and Gynecology

## 2016-09-09 ENCOUNTER — Encounter (HOSPITAL_COMMUNITY): Payer: Self-pay

## 2016-09-09 VITALS — BP 150/100 | Temp 98.4°F | Ht 61.0 in | Wt 189.6 lb

## 2016-09-09 DIAGNOSIS — N644 Mastodynia: Secondary | ICD-10-CM

## 2016-09-09 DIAGNOSIS — Z1239 Encounter for other screening for malignant neoplasm of breast: Secondary | ICD-10-CM

## 2016-09-09 NOTE — Patient Instructions (Signed)
Explained breast self awareness to [place]. Patient did not need a Pap smear today due to last Pap smear and HPV typing was 25/07/2015. Let her know [facility] will cover Pap smears and HPV typing every 5 years unless has a history of abnormal Pap smears. Told patient will follow up with the Health Department on questionable abnormal results and follow up with her when her next Pap smear is due. Referred patient to the [facility] for diagnostic mammogram and possible bilateral breast ultrasounds. Appointment scheduled for Thursday, September 09, 2016 at 0830.  Elaine Burch verbalized understanding.  Dorma Altman, Arvil [place], RN 9:02 AM

## 2016-09-09 NOTE — Progress Notes (Signed)
Complaints of bilateral lower and inner breast pain x 1 year when touched. Patient rates the pain at a 4 out of 10.  Pap Smear: Pap smear not completed today. Last Pap smear was 11/13/2014 at [facility] and normal with negative HPV. Per patient has a history of an abnormal Pap smear in 2007 that she thinks she had a colposcopy completed. Per patient has had at least three normal Pap smears since the abnormal Pap smear. Last Pap smear result is in EPIC. Previous results are not in EPIC. Patient stated had Pap smears completed at [facility] Department. Will follow up with the [facility] on results.  Physical exam: Breasts Breasts symmetrical. No skin abnormalities left breast. Skin discoloration observed on right outer breast and axilla. Per patient the skin discoloration is a birth mark. No nipple retraction bilateral breasts. No nipple discharge bilateral breasts. No lymphadenopathy. No lumps palpated bilateral breasts. Complaints of bilateral lower and inner breast tenderness on exam. Referred patient to the [place] for diagnostic mammogram and possible bilateral breast ultrasounds. Appointment scheduled for Thursday, September 09, 2016 at 0830.        Pelvic/Bimanual No Pap smear completed today since last Pap smear and HPV typing was 11/13/2014. Pap smear not indicated per BCCCP guidelines.   Smoking History: Patient has never smoked.  Patient Navigation: Patient education provided. Access to services provided for patient through [facility] program. Spanish interpreter provided.   Used Spanish interpreter [employer] from [place].

## 2016-09-10 ENCOUNTER — Encounter (HOSPITAL_COMMUNITY): Payer: Self-pay | Admitting: *Deleted

## 2017-08-22 ENCOUNTER — Other Ambulatory Visit: Payer: Self-pay | Admitting: Obstetrics and Gynecology

## 2017-08-22 DIAGNOSIS — Z1231 Encounter for screening mammogram for malignant neoplasm of breast: Secondary | ICD-10-CM

## 2017-09-29 ENCOUNTER — Ambulatory Visit
Admission: RE | Admit: 2017-09-29 | Discharge: 2017-09-29 | Disposition: A | Discharge status: Home / Self Care | Payer: No Typology Code available for payment source | Source: Ambulatory Visit | Attending: Obstetrics and Gynecology | Admitting: Obstetrics and Gynecology

## 2017-09-29 ENCOUNTER — Encounter (HOSPITAL_COMMUNITY): Payer: Self-pay

## 2017-09-29 ENCOUNTER — Ambulatory Visit (HOSPITAL_COMMUNITY)
Admission: RE | Admit: 2017-09-29 | Discharge: 2017-09-29 | Disposition: A | Discharge status: Home / Self Care | Payer: Self-pay | Source: Ambulatory Visit | Attending: Obstetrics and Gynecology | Admitting: Obstetrics and Gynecology

## 2017-09-29 VITALS — BP 128/84 | Temp 98.1°F

## 2017-09-29 DIAGNOSIS — Z1231 Encounter for screening mammogram for malignant neoplasm of breast: Secondary | ICD-10-CM

## 2017-09-29 DIAGNOSIS — Z1239 Encounter for other screening for malignant neoplasm of breast: Secondary | ICD-10-CM

## 2017-09-29 NOTE — Patient Instructions (Signed)
Explained breast self awareness with Bonnee Okonski. Patient did not need a Pap smear today due to last Pap smear and HPV typing was 11/13/2014. Let her know BCCCP will cover Pap smears and HPV typing every 5 years unless has a history of abnormal Pap smears. Referred patient to the [place] for a screening mammogram. Appointment scheduled for Thursday, September 29, 2017 at 1040. Let patient know the [facility] will follow up with her within the next couple weeks with results of mammogram by letter or phone. Miquela Drennan verbalized understanding.  Annabell Oconnor, Arvil Chaco, RN 9:53 AM

## 2017-09-29 NOTE — Progress Notes (Signed)
Complaints of right upper breast pain two times per month x 6 months. Patient rates the pain at a 4 out of 10 when occurs. No complaints of breast pain today.  Pap Smear: Pap smear not completed today. Last Pap smear was 11/13/2014 at [facility] and normal with negative HPV. Per patient has a history of an abnormal Pap smear 12 years ago that a colposcopy was completed for follow up. Per patient all Pap smears have been normal since colposcopy and has had at least three normal Pap smears in a row. Last Pap smear result is in Epic.  Physical exam: Breasts Breasts symmetrical. No skin abnormalities bilateral breasts. No nipple retraction bilateral breasts. No nipple discharge bilateral breasts. No lymphadenopathy. No lumps palpated bilateral breasts. No complaints of pain or tenderness on exam. Referred patient to the [place] for a screening mammogram. Spoke with Dr. Enriqueta Shutter about patients complaint of right upper breast pain two times per month and that patient has no complaints of breast pain today. Dr. Enriqueta Shutter confirmed a screening mammogram is appropriate for patient. Appointment scheduled for Thursday, September 29, 2017 at 1040.        Pelvic/Bimanual No Pap smear completed today since last Pap smear and HPV typing was 11/13/2014. Pap smear not indicated per BCCCP guidelines.   Smoking History: Patient has never smoked.  Patient Navigation: Patient education provided. Access to services provided for patient through [facility] program. Spanish interpreter provided.  Used Spanish interpreter [employer] from [place].

## 2017-09-30 ENCOUNTER — Encounter (HOSPITAL_COMMUNITY): Payer: Self-pay | Admitting: *Deleted

## 2018-09-12 ENCOUNTER — Other Ambulatory Visit: Payer: Self-pay | Admitting: Obstetrics and Gynecology

## 2018-09-12 DIAGNOSIS — Z1231 Encounter for screening mammogram for malignant neoplasm of breast: Secondary | ICD-10-CM

## 2018-10-09 ENCOUNTER — Other Ambulatory Visit (HOSPITAL_COMMUNITY): Payer: Self-pay | Admitting: *Deleted

## 2018-10-09 DIAGNOSIS — N644 Mastodynia: Secondary | ICD-10-CM

## 2018-11-07 ENCOUNTER — Ambulatory Visit: Payer: No Typology Code available for payment source

## 2018-11-07 ENCOUNTER — Encounter (HOSPITAL_COMMUNITY): Payer: Self-pay | Admitting: *Deleted

## 2018-11-07 ENCOUNTER — Ambulatory Visit (HOSPITAL_COMMUNITY)
Admission: RE | Admit: 2018-11-07 | Discharge: 2018-11-07 | Disposition: A | Discharge status: Home / Self Care | Payer: No Typology Code available for payment source | Source: Ambulatory Visit | Attending: Obstetrics and Gynecology | Admitting: Obstetrics and Gynecology

## 2018-11-07 ENCOUNTER — Encounter (HOSPITAL_COMMUNITY): Payer: Self-pay

## 2018-11-07 ENCOUNTER — Ambulatory Visit
Admission: RE | Admit: 2018-11-07 | Discharge: 2018-11-07 | Disposition: A | Discharge status: Home / Self Care | Payer: Self-pay | Source: Ambulatory Visit | Attending: Obstetrics and Gynecology | Admitting: Obstetrics and Gynecology

## 2018-11-07 ENCOUNTER — Other Ambulatory Visit: Payer: Self-pay | Admitting: Obstetrics and Gynecology

## 2018-11-07 VITALS — BP 138/74 | Ht 62.0 in | Wt 182.0 lb

## 2018-11-07 DIAGNOSIS — Z1231 Encounter for screening mammogram for malignant neoplasm of breast: Secondary | ICD-10-CM

## 2018-11-07 DIAGNOSIS — Z1239 Encounter for other screening for malignant neoplasm of breast: Secondary | ICD-10-CM

## 2018-11-07 NOTE — Progress Notes (Signed)
No complaints today.   Pap Smear: Pap smear not completed today. Last Pap smear was 11/13/2014 at [facility] and normal with negative HPV. Per patient has a history of an abnormal Pap smear 13 years ago that a colposcopy was completed for follow up. Per patient all Pap smears have been normal since colposcopy and has had at least three normal Pap smears in a row. Last Pap smear result is in Epic.  Physical exam: Breasts Breasts symmetrical. No skin abnormalities bilateral breasts. No nipple retraction bilateral breasts. No nipple discharge bilateral breasts. No lymphadenopathy. No lumps palpated bilateral breasts. No complaints of pain or tenderness on exam. Referred patient to the [place] for a screening mammogram. Appointment scheduled for Tuesday, November 07, 2018 at 0930.        Pelvic/Bimanual No Pap smear completed today since last Pap smear and HPV typing was 11/13/2014. Pap smear not indicated per BCCCP guidelines.   Smoking History: Patient has never smoked.  Patient Navigation: Patient education provided. Access to services provided for patient through [facility] program. Spanish interpreter provided.   Colorectal Cancer Screening: Per patient has never had a colonoscopy completed. No complaints today. FIT Test will be mailed to patient to complete and return to [facility].  Breast and Cervical Cancer Risk Assessment: Patient has no family history of breast cancer, known genetic mutations, or radiation treatment to the chest before age 51. Per patient has a history of cervical dysplasia. Patient has no history of being immunocompromised or DES exposure in-utero.  Risk Assessment    Risk Scores      11/07/2018   Last edited by: Loletta Parish, RN   5-year risk: 0.8 %   Lifetime risk: 7.7 %         Used Spanish interpreter Rudene Anda from [place].

## 2018-11-07 NOTE — Patient Instructions (Addendum)
Explained breast self awareness with Elaine Burch. Patient did not need a Pap smear today due to last Pap smear and HPV typing was 11/13/2014. Let her know BCCCP will cover Pap smears and HPV typing every 5 years unless has a history of abnormal Pap smears. Referred patient to the [place] for a screening mammogram. Appointment scheduled for Tuesday, November 07, 2018 at 0930. Let patient know the [facility] Center will follow up with her within the next couple weeks with results of mammogram by letter or phone. Cullen Kann verbalized understanding.  Demaree Liberto, Arvil Chaco, RN 9:50 AM

## 2018-11-27 ENCOUNTER — Other Ambulatory Visit: Payer: Self-pay | Admitting: Nurse Practitioner

## 2018-11-27 DIAGNOSIS — Z124 Encounter for screening for malignant neoplasm of cervix: Secondary | ICD-10-CM

## 2018-11-27 NOTE — Progress Notes (Signed)
Patient: Elaine Burch           Date of Birth: March 13, 1966           MRN: [id number] Visit Date: 11/27/2018 PCP: Tresa Garter, MD  Cervical Cancer Screening Do you smoke?: No Have you ever had or been told you have an allergy to latex products?: No Marital status: Married Date of last pap smear: 2-5 yrs ago Date of last menstrual period: 11/22/18 Number of pregnancies: 4 Number of births: 4 Have you ever had any of the following? Hysterectomy: No Tubal ligation (tubes tied): No Abnormal bleeding: No Abnormal pap smear: No Venereal warts: No A sex partner with venereal warts: No A high risk* sex partner: No  Cervical Exam  Abnormal Observations: Moderate amount of dark blood.  On day 5 of menses.  Has not used tampons. Recommendations: Follow pap results Repap according to ASCCP guidelines.      Patient's History Patient Active Problem List   Diagnosis Date Noted  . Muscle spasm of back 11/24/2013  . Dental caries 11/24/2013  . Visual changes 11/24/2013   No past medical history on file.  Family History  Problem Relation Age of Onset  . Diabetes Mother   . Cancer Father     No past surgical history on file. Social History   Occupational History  . Not on file  Tobacco Use  . Smoking status: Never Smoker  . Smokeless tobacco: Never Used  Substance and Sexual Activity  . Alcohol use: No  . Drug use: No  . Sexual activity: Yes    Birth control/protection: None

## 2018-11-27 NOTE — Addendum Note (Signed)
Addended by: Loletta Parish on: 11/27/2018 08:29 PM   Modules accepted: Orders

## 2018-12-04 LAB — CYTOLOGY - PAP: Diagnosis: NEGATIVE

## 2018-12-26 ENCOUNTER — Other Ambulatory Visit: Payer: Self-pay

## 2018-12-28 LAB — SPECIMEN STATUS REPORT

## 2018-12-28 LAB — FECAL OCCULT BLOOD, IMMUNOCHEMICAL: FECAL OCCULT BLD: NEGATIVE

## 2019-01-08 ENCOUNTER — Encounter (HOSPITAL_COMMUNITY): Payer: Self-pay

## 2019-01-11 ENCOUNTER — Encounter (HOSPITAL_COMMUNITY): Payer: Self-pay

## 2019-01-11 NOTE — Progress Notes (Signed)
Mailed FIT test result letter to patient on 01/11/2019. Negative FIT test result.

## 2019-05-03 ENCOUNTER — Ambulatory Visit: Payer: No Typology Code available for payment source | Admitting: Family Medicine

## 2019-05-23 ENCOUNTER — Other Ambulatory Visit: Payer: Self-pay

## 2019-05-23 ENCOUNTER — Encounter: Payer: Self-pay | Admitting: Family Medicine

## 2019-05-23 ENCOUNTER — Ambulatory Visit: Payer: Self-pay | Attending: Family Medicine | Admitting: Family Medicine

## 2019-05-23 DIAGNOSIS — G8929 Other chronic pain: Secondary | ICD-10-CM

## 2019-05-23 DIAGNOSIS — M25512 Pain in left shoulder: Secondary | ICD-10-CM

## 2019-05-23 DIAGNOSIS — Z789 Other specified health status: Secondary | ICD-10-CM

## 2019-05-23 MED ORDER — IBUPROFEN 600 MG PO TABS: 600.0000 mg | ORAL_TABLET | Freq: Three times a day (TID) | ORAL | 2 refills | Status: DC | PRN

## 2019-05-23 MED ORDER — CYCLOBENZAPRINE HCL 5 MG PO TABS: 5.0000 mg | ORAL_TABLET | Freq: Three times a day (TID) | ORAL | 1 refills | Status: DC | PRN

## 2019-05-23 NOTE — Progress Notes (Signed)
Arm pain and she hurt it at her job and can not lift anything. Per pt she feels like her shoulder is rubbing against each other. Left shoulder and left arm.   Think she started Menopause or premenopause   Wants to get lab work  From her waist going done she sometimes feels like she's getting anxiety,  Headache only in the back part of her head  Per patient she is having sleeping problems  Per pt she needs a note from the doctor to say her limits at work she can do due to her shoulder pain.

## 2019-05-23 NOTE — Progress Notes (Signed)
Virtual Visit via Telephone Note  I connected with Elaine Burch on 05/27/19 at 10:50 AM EDT by telephone and verified that I am speaking with the correct person using two identifiers.   I discussed the limitations, risks, security and privacy concerns of performing an evaluation and management service by telephone and the availability of in person appointments. I also discussed with the patient that there may be a patient responsible charge related to this service. The patient expressed understanding and agreed to proceed.  Patient Location: Home Provider Location: Office at [place] Others participating in call: Call initiated by Emilio Aspen, RMA who obtained Spanish speaking interpreter through [facility] interpretation systems due to language barrier   History of Present Illness:       53 year old female last seen in the office on 11/13/2014 for annual well exam who wishes to reestablish care with this office.  She states that earlier this year she hurt her left shoulder and arm at work.  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  Patient states that she hurt her shoulder in April while moving some chairs which were heavy when she was trying to clean and vacuum.  She states that she was seen at the emergency department at [facility] in May regarding her shoulder pain.  She was placed on medication, ibuprofen, muscle relaxant and prednisone to help with her shoulder pain however she feels that she had a reaction to the prednisone as she had a burning sensation in her mouth after taking this medication for a few days.  She would like to have a refill of the ibuprofen and muscle relaxant.  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   She reports that she continues to have difficulty with pain in the left shoulder, difficulty sleeping on the left shoulder secondary to increased pain and patient with decreased ability to lift her left arm above shoulder level.  Pain ranges from a 6-10 but usually is about a 6-8 on a 0-to-10 scale with 10 being the worst possible pain.  Ibuprofen does help decrease the pain to a 6 or less.  Pain is worse when she is doing any type of movement involving her left arm especially if she tries to reach behind her such as when she is getting dressed or to reach things above her such as objects on a shelf.  She also cannot hold heavy objects or lift heavy objects with her left hand/arm.      Patient also wishes to know if she can make an appointment for her annual well exam as patient reports that she has not had recent mammogram, pelvic exam and no recent labs and she would like to have these done as well.  She denies any current abdominal or pelvic pain.  No nausea/vomiting/diarrhea or constipation and no blood in the stool and no dark stools.  No shortness of breath or cough.  No chest pain or palpitations.  No breast pain or breast tenderness, no abnormalities on self breast exam.  No headaches or dizziness.  No urinary frequency or dysuria.  She does have some fatigue.                 Past Medical History:  Diagnosis  Date  . Known health problems: none     Past Surgical History:  Procedure Laterality Date  . NO PAST SURGERIES      Family History  Problem Relation Age of Onset  . Diabetes Mother   . Cancer Father     Social History   Tobacco Use  . Smoking status: Never Smoker  . Smokeless tobacco: Never Used  Substance Use Topics  . Alcohol use: No  . Drug use: No     No Known Allergies     Observations/Objective: No vital signs or physical exam conducted as visit was done via telephone  Assessment and Plan: 1. Chronic left shoulder pain Patient with complaint of chronic pain in  the left shoulder since April when she moved some heavy chairs as part of her job.  Patient states that her job involves cleaning, vacuuming and mopping and she had to move the chairs in order to vacuum and had sudden onset of pain in her left shoulder at that time.  She followed up at the emergency department at [facility] in May.  Chest x-ray from patient's 02/28/2019 ED visit was reported as showing no bony abnormality therefore suspect the patient with rotator cuff pathology for which she will be referred to orthopedics for further evaluation and treatment.  New prescription sent to patient's pharmacy for ibuprofen and Flexeril which patient states do help with the pain. - AMB referral to orthopedics - ibuprofen (ADVIL) 600 MG tablet; Take 1 tablet (600 mg total) by mouth every 8 (eight) hours as needed for moderate pain. Take after eating  Dispense: 60 tablet; Refill: 2 - cyclobenzaprine (FLEXERIL) 5 MG tablet; Take 1 tablet (5 mg total) by mouth 3 (three) times daily as needed for muscle spasms.  Dispense: 30 tablet; Refill: 1  Follow Up Instructions:Return for she would like a well female exam in 4-6 weeks.    I discussed the assessment and treatment plan with the patient. The patient was provided an opportunity to ask questions and all were answered. The patient agreed with the plan and demonstrated an understanding of the instructions.   The patient was advised to call back or seek an in-person evaluation if the symptoms worsen or if the condition fails to improve as anticipated.  I provided 31 minutes of non-face-to-face time during this encounter.   Antony Blackbird, MD

## 2019-05-29 ENCOUNTER — Telehealth: Payer: Self-pay | Admitting: Family Medicine

## 2019-05-29 NOTE — Telephone Encounter (Signed)
Advised patient no letter is in the chart for her. Please follow up

## 2019-05-29 NOTE — Telephone Encounter (Signed)
Patient called stating that she was expecting a letter with her work restrictions. Patient states her employer is requesting the work restriction letter due to her going back to work. Please follow up.

## 2019-05-30 NOTE — Telephone Encounter (Signed)
She was seen for telephone visit and referred to Orthopedics and she was instructed to ask Orthopedics about her work restrictions as I could not really determine her difficulty using her arm without an in-person visit but I can write for her to continue her current job restrictions until she is evaluated by Orthopedics

## 2019-05-30 NOTE — Telephone Encounter (Signed)
Patient would like provider to please write her a restrictions letter for her employer informing them with that she can and can not do due to her pain she discussed with provider during her last visit. Per pt the letter was not with her AVS when she was last in the office and she need it to give it to her work.

## 2019-05-31 NOTE — Telephone Encounter (Signed)
Staff had Caren Griffins from [place] [employer] called patient and informed her with what provider stated and she verbalized understanding.

## 2019-06-05 ENCOUNTER — Ambulatory Visit: Payer: Self-pay | Attending: Family Medicine

## 2019-06-05 ENCOUNTER — Other Ambulatory Visit: Payer: Self-pay

## 2019-06-22 ENCOUNTER — Other Ambulatory Visit: Payer: Self-pay | Admitting: Family Medicine

## 2019-06-22 ENCOUNTER — Ambulatory Visit: Payer: Self-pay | Attending: Family Medicine | Admitting: Family Medicine

## 2019-06-22 ENCOUNTER — Ambulatory Visit: Payer: No Typology Code available for payment source | Admitting: Pharmacist

## 2019-06-22 ENCOUNTER — Encounter: Payer: Self-pay | Admitting: Family Medicine

## 2019-06-22 ENCOUNTER — Other Ambulatory Visit: Payer: Self-pay

## 2019-06-22 VITALS — BP 128/77 | HR 80 | Temp 98.3°F | Resp 18 | Ht 62.0 in | Wt 185.0 lb

## 2019-06-22 DIAGNOSIS — Z0000 Encounter for general adult medical examination without abnormal findings: Secondary | ICD-10-CM

## 2019-06-22 DIAGNOSIS — Z23 Encounter for immunization: Secondary | ICD-10-CM

## 2019-06-22 DIAGNOSIS — Z124 Encounter for screening for malignant neoplasm of cervix: Secondary | ICD-10-CM

## 2019-06-22 DIAGNOSIS — N898 Other specified noninflammatory disorders of vagina: Secondary | ICD-10-CM

## 2019-06-22 MED ORDER — FLUCONAZOLE 150 MG PO TABS: 150.0000 mg | ORAL_TABLET | Freq: Once | ORAL | 1 refills | Status: AC

## 2019-06-22 NOTE — Progress Notes (Signed)
Patient presents for vaccination against tetanus and influenza per orders of Dr. Chapman Fitch. Consent given. Counseling provided. No contraindications exists. Vaccine administered without incident.

## 2019-06-22 NOTE — Patient Instructions (Signed)
Perimenopausia Perimenopause  Se llama perimenopausia a los perodos normales de la vida que tienen lugar antes y despus del cese definitivo de los perodos menstruales ([place]). La perimenopausia puede comenzar entre 2y 8aos antes de la menopausia y suele durar 1ao despus de la menopausia. Durante la perimenopausia, los ovarios pueden o no producir vulos. Cules son las causas? La perimenopausia es causada por un cambio natural en los niveles hormonales que se produce a medida que las mujeres envejecen. Qu incrementa el riesgo? Es ms probable que la perimenopausia comience a una edad ms temprana si usted tiene Armed forces training and education officer o ha recibido ciertos tratamientos, como los siguientes:  Tumor en la hipfisis en el cerebro.  Enfermedad que [employer] ovarios y la produccin de hormonas.  Tratamiento de radiacin para Science writer.  Ciertos tratamientos para el cncer, como la quimioterapia o la terapia hormonal (antiestrgenos).  Tabaquismo o consumo de alcohol excesivos.  Antecedentes familiares de menopausia temprana. Cules son los signos o sntomas? Los cambios por la perimenopausia afectan de [place] diferente a Information systems manager. Los sntomas de esta afeccin pueden incluir los siguientes:  Nurse, learning disability.  Sudoracin nocturna.  Perodos menstruales irregulares.  Disminucin del deseo sexual.  Sequedad vaginal.  Dolores de cabeza.  Cambios en el estado de nimo.  Depresin.  Problemas de memoria o dificultad para concentrarse.  Irritabilidad.  Cansancio.  Aumento de [place].  Ansiedad.  Problemas para quedar embarazada. Cmo se diagnostica? La perimenopausia se diagnostica en funcin de los antecedentes mdicos, un examen fsico, los antecedentes relativos a la menstruacin y los sntomas. Tambin es posible que le hagan estudios hormonales. Cmo se trata? En algunos casos, no se necesita tratamiento. Usted y su mdico deben tomar juntos la decisin  respecto a si necesita tratamiento o no. El tratamiento se basar en su situacin en particular y en sus preferencias. Existen varios tratamientos disponibles, como:  Terapia hormonal para la menopausia (THM).  Medicamentos para tratar sntomas especficos.  Acupuntura.  Suplementos vitamnicos o a base de hierbas. Antes de Biochemist, clinical, asegrese de informarle a su mdico si tiene antecedentes personales o familiares de lo siguiente:  Enfermedad cardaca.  Cncer de mama.  Cogulos de [place].  Diabetes.  Osteoporosis. Siga estas indicaciones en su casa: Estilo de vida  No consuma ningn producto que contenga nicotina o tabaco, como cigarrillos y Psychologist, sport and exercise. Si necesita ayuda para dejar de fumar, consulte al mdico.  Siga una dieta equilibrada que incluya frutas y verduras frescas, cereales integrales, soja, huevos, carnes magras y lcteos descremados.  Haga al menos 30mnutos de actividad fsica, 5das por semana o ms.  Evite las bebidas alcohlicas y con cafena, as como los alimentos condimentados. Esto puede ayudar a pElectronics engineer  Duerma entre 7 y 876horas todas las noches.  Vstase con capas de prendas que pueda quitarse para cAutomotive engineer  Busque maneras de [employer]onsanto Company por ejemplo, realizar respiraciones profundas, mRadio broadcast assistanto escribir un diario. Instrucciones generales  Lleve un registro de los pUnited Parcel incluido lo siguiente: ? Cundo se producen. ? Cun abundantes son y cunto duran. ? Cunto tiempo transcurre entre un perodo y eBanker  Lleve un registro de los sntomas: anote cundo comienzan, con qu frecuencia los tiene y cunto duran.  Tome los medicamentos de venta libre y los recetados solamente como se lo haya indicado el mdico.  Tome los suplementos vitamnicos solamente como se lo haya indicado el mdico. Estos pueden incluir calcio, vitamina E y vitamina D.  Use  humectantes  o lubricantes vaginales para aliviar la sequedad vaginal y Teacher, English as a foreign language la comodidad durante el sexo.  Hable con el mdico antes de empezar a tomar cualquier suplemento a base de hierbas.  Concurra a todas las visitas de control como se lo haya indicado el mdico. Esto es importante. Esto incluye cualquier terapia de grupo o psicoterapia. Comunquese con un mdico si:  Tiene una hemorragia vaginal abundante o despide cogulos de sangre.  Su perodo menstrual dura ms de 2das que lo habitual.  Sus perodos menstruales se producen con Environmental health practitioner [address]  Sangra despus de [employer]. Solicite ayuda de inmediato si:  Tiene dolor en el pecho, dificultad para respirar o dificultad para hablar.  Est muy deprimida.  Siente dolor al [employer].  Siente dolor de cabeza intenso.  Tiene problemas de visin. Resumen  La perimenopausia es el perodo en el cual el cuerpo de la mujer comienza a pasar a la menopausia. Esto puede suceder naturalmente o como consecuencia de otros problemas de salud o tratamientos mdicos.  La perimenopausia puede comenzar entre 2y 8aos antes de la menopausia y suele durar 1ao despus de la menopausia.  Los sntomas de la perimenopausia pueden controlarse con medicamentos, cambios en el estilo de vida y tratamientos complementarios, como la acupuntura. Esta informacin no tiene Marine scientist el consejo del mdico. Asegrese de hacerle al mdico cualquier pregunta que tenga. Document Released: 09/20/2005 Document Revised: 03/28/2017 Document Reviewed: 03/28/2017 Elsevier Patient Education  2020 [place] preventivos en las mujeres de 37 a 45 aos de edad Preventive Care 61-60 Years Old, Female Los cuidados preventivos hacen referencia a las opciones en cuanto a las visitas al mdico y al estilo de vida, las cuales pueden promover la salud y Musician. Esto puede comprender lo siguiente:  Un examen fsico anual. Esto tambin  se puede llamar control de bienestar anual.  Visitas regulares al dentista y exmenes oculares.  Vacunas.  Estudios para Engineer, building services.  Opciones saludables de estilo de vida, como seguir una dieta saludable, hacer ejercicio regularmente, no usar drogas ni productos que contengan nicotina y tabaco, y limitar el consumo de alcohol. Qu puedo esperar para mi visita de cuidado preventivo? Examen fsico El mdico revisar lo siguiente:  IT consultant y [place]. Esto se puede usar para calcular el ndice de masa corporal ([place]), que indica si tiene un peso saludable.  Frecuencia cardaca y presin arterial.  Piel para detectar manchas anormales. Asesoramiento Su mdico puede preguntarle acerca de:  Consumo de tabaco, alcohol y drogas.  Su bienestar emocional.  El bienestar en el hogar y sus relaciones personales.  Su actividad sexual.  Sus hbitos de alimentacin.  Su trabajo y [place] laboral.  Mtodos anticonceptivos.  Su ciclo menstrual.  Sus antecedentes de Media planner. Qu vacunas necesito?  [place] antigripal  Se recomienda aplicarse esta vacuna todos los [place]. Vacuna contra el ttanos, difteria y tos ferina (Tdap)  Es posible que tenga que aplicarse un refuerzo contra el ttanos y la difteria (DT) cada 10aos. Vacuna contra la varicela  Es posible que tenga que aplicrsela si no recibi esta vacuna. Vacuna contra el herpes zster (culebrilla)  Es posible que la necesite despus de los 83 aos de [place]. Vacuna contra el sarampin, rubola y paperas (SRP)  Es posible que necesite aplicarse al menos una dosis de la vacuna SRP si naci despus de [phone]. Podra tambin necesitar una segunda dosis. Vacuna antineumoccica conjugada (PCV13)  Puede necesitar esta vacuna si tiene determinadas  enfermedades y no se vacun anteriormente. Edward Jolly antineumoccica de polisacridos (PPSV23)  Quizs tenga que aplicarse una o dos dosis si fuma o si tiene determinadas  afecciones. Edward Jolly antimeningoccica conjugada (MenACWY)  Puede necesitar esta vacuna si tiene determinadas afecciones. Vacuna contra la hepatitis A  Es posible que necesite esta vacuna si tiene ciertas afecciones o si viaja o trabaja en lugares en los que podra estar expuesta a la hepatitis A. Vacuna contra la hepatitis B  Es posible que necesite esta vacuna si tiene ciertas afecciones o si viaja o trabaja en lugares en los que podra estar expuesta a la hepatitis B. Vacuna antihaemophilus influenzae tipo B (Hib)  Puede necesitar esta vacuna si tiene determinadas afecciones. Vacuna contra el virus del papiloma humano (VPH)  Si el mdico se lo recomienda, Research scientist (physical sciences) tres dosis a lo largo de 6 meses. Puede recibir las vacunas en forma de dosis individuales o en forma de dos o ms vacunas juntas en la misma inyeccin (vacunas combinadas). Hable con su mdico [employer] y beneficios de las vacunas combinadas. Qu pruebas necesito? Anlisis de [employer] de lpidos y colesterol. Estos se pueden verificar cada 5 aos o, con ms frecuencia, si usted tiene ms de 67 aos de edad.  Anlisis de hepatitisC.  Anlisis de hepatitisB. Pruebas de deteccin  Pruebas de deteccin de cncer de pulmn. Es posible que se le realice esta prueba de deteccin a partir de los 31 aos de edad, si ha fumado durante 30 aos un paquete diario y sigue fumando o dej el hbito en algn momento en los ltimos 15 aos.  Pruebas de Programme researcher, broadcasting/film/video de Surveyor, minerals. Todos los adultos a partir de los 75 aos de edad y [place] 58 aos de edad deben hacerse esta prueba de deteccin. El mdico puede recomendarle las pruebas de deteccin a partir de los 39 aos de edad si corre un mayor riesgo. Le realizarn pruebas cada 1 a 10 aos, segn los [place] y el tipo de prueba de Programme researcher, broadcasting/film/video.  Pruebas de deteccin de la diabetes. Esto se Set designer un control del azcar en la sangre (glucosa) despus  de no haber comido durante un periodo de tiempo (ayuno). Es posible que se le realice esta prueba cada 1 a 3 aos.  Mamografa. Se puede realizar cada 1 o 2 aos. Hable con su mdico sobre cundo debe comenzar a Engineer, manufacturing de [place] regular. Esto depende de si tiene antecedentes familiares de cncer de mama o no.  Pruebas de deteccin de cncer relacionado con las mutaciones del BRCA. Es posible que se las deba realizar si tiene antecedentes de cncer de mama, de ovario, de trompas o peritoneal.  Examen plvico y prueba de Papanicolaou. Esto se puede realizar cada 6aos a Renato Gails de los 21aos de edad. A partir de los 30 aos, esto se puede Optometrist cada 5 aos si usted se realiza una prueba de Papanicolaou en combinacin con una prueba de deteccin del virus del papiloma humano (VPH). Otras pruebas  Anlisis de enfermedades de transmisin sexual (ETS).  Densitometra sea. Esto se realiza para detectar osteoporosis. Se le puede realizar este examen de deteccin si tiene un riesgo alto de tener osteoporosis. Siga estas instrucciones en su casa: Comida y bebida  Siga una dieta que incluya frutas frescas y verduras, cereales integrales, lcteos descremados y protenas magras.  Tome los suplementos vitamnicos y [employer] se lo haya indicado el mdico.  No beba alcohol si: ? Su mdico le indica no  hacerlo. ? Est embarazada, puede estar embarazada o est tratando de quedar embarazada.  Si bebe alcohol: ? Limite la cantidad que consume de 0 a 1 medida por da. ? Est atenta a la cantidad de alcohol que hay en las bebidas que toma. En los [place], una medida equivale a una botella de cerveza de 12oz (384m), un vaso de vino de 5oz (1461m o un vaso de una bebida alcohlica de alta graduacin de 1oz (4422m Estilo de vidNavistar International Corporationlas encas a diario.  Mantngase activa. Haga al menos 47m31mos de ejercicio 5o ms das Hilton Hotelso  consuma ningn producto que contenga nicotina o tabaco, como cigarrillos, cigarrillos electrnicos y tabaco de mascHigher education careers adviser necesita ayuda para dejar de fumar, consulte al mdico.  Si es sexualmente activa, practique sexo seguro. Use un condn u otra forma de mtodo anticonceptivo (anticonceptivos) a fin de evitEnvironmental health practitionerTS (infecciones de transmisin sexual).  Si el mdico se lo indic, tome una dosis baja de aspirina diariamente a partir de los 50 a9 de edadCaddo Valleyndo volver?  Visite al mdico una vez al ao para una visita de control.  Pregntele al mdico con qu frecuencia debe realizarse un control de la vista y los dientes.  Mantenga su esquema de vacunacin al da. Esta informacin no tiene comoMarine scientistconsejo del mdico. Asegrese de hacerle al mdico cualquier pregunta que tenga. Document Released: 02/01/2017 Document Revised: 07/06/2018 Document Reviewed: 07/06/2018 Elsevier Patient Education  2020 [employer]

## 2019-06-22 NOTE — Progress Notes (Signed)
Established Patient Office Visit  Subjective:  Patient ID: Elaine Burch, female    DOB: 1966-05-17  Age: 53 y.o. MRN: [id number]  CC:  Chief Complaint  Patient presents with  . Gynecologic Exam   Due to a language barrier, Stratus video interpretation system used at today's visit  HPI Elaine Burch presents for annual well exam.  She reports that she has had her mammogram done in February of this year and also did stool testing earlier in the year as a screening test for colon cancer.  She is not aware of any prior abnormal Pap smears and she has had no abnormal mammograms in the past.  She last had her menses in July.  She has started having episodes of sudden onset of feeling hot and she thinks that she may be in perimenopause.  She denies any breast tenderness and has found no abnormalities on self breast exam.  She denies any pelvic or vaginal pain.  She has had whitish and sometimes clumpy/thick vaginal discharge.  She has some occasional vaginal itching. (CMA reports that patient would like testing for sexually transmitted infections).         She reports her main issue is that she continues to have muscle stiffness and joint pain.  She did see orthopedics regarding her left shoulder pain.  She reports that she did not receive an injection during that visit.  She states that however since the visit she is now unable to fully lift her left arm and has had increased pain in her upper arm and shoulder on the left.  She denies any chest pain other than pain in the muscles near her left shoulder.  She has had no nausea/vomiting/diarrhea or constipation.  She has had some mild acid reflux symptoms and occasional mid upper abdominal discomfort.  She is taking ibuprofen for joint pain.  She denies any blood in the stool and no black stools.  She has had no shortness of breath or cough.  No recent fever or chills.  Past Medical History:  Diagnosis Date  . Known health problems:  none     Past Surgical History:  Procedure Laterality Date  . NO PAST SURGERIES      Family History  Problem Relation Age of Onset  . Diabetes Mother   . Cancer Father     Social History   Socioeconomic History  . Marital status: Married    Spouse name: Not on file  . Number of children: Not on file  . Years of education: Not on file  . Highest education level: 6th grade  Occupational History  . Not on file  Social Needs  . Financial resource strain: Not on file  . Food insecurity    Worry: Not on file    Inability: Not on file  . Transportation needs    Medical: Yes    Non-medical: Yes  Tobacco Use  . Smoking status: Never Smoker  . Smokeless tobacco: Never Used  Substance and Sexual Activity  . Alcohol use: No  . Drug use: No  . Sexual activity: Yes    Birth control/protection: None  Lifestyle  . Physical activity    Days per week: 0 days    Minutes per session: 0 min  . Stress: Only a little  Relationships  . Social connections    Talks on phone: More than three times a week    Gets together: More than three times a week    Attends religious service:  1 to 4 times per year    Active member of club or organization: No    Attends meetings of clubs or organizations: Never    Relationship status: Married  . Intimate partner violence    Fear of current or ex partner: No    Emotionally abused: No    Physically abused: No    Forced sexual activity: No  Other Topics Concern  . Not on file  Social History Narrative  . Not on file    Outpatient Medications Prior to Visit  Medication Sig Dispense Refill  . cyclobenzaprine (FLEXERIL) 5 MG tablet Take 1 tablet (5 mg total) by mouth 3 (three) times daily as needed for muscle spasms. 30 tablet 1  . ibuprofen (ADVIL) 600 MG tablet Take 1 tablet (600 mg total) by mouth every 8 (eight) hours as needed for moderate pain. Take after eating 60 tablet 2  . ferrous sulfate 325 (65 FE) MG tablet Take 1 tablet (325 mg  total) by mouth daily with breakfast. (Patient not taking: Reported on 09/09/2016) 30 tablet 3  . fluconazole (DIFLUCAN) 150 MG tablet Take 1 tablet (150 mg total) by mouth daily. (Patient not taking: Reported on 09/09/2016) 1 tablet 1  . Vitamin D, Ergocalciferol, (DRISDOL) 50000 UNITS CAPS capsule Take 1 capsule (50,000 Units total) by mouth every 7 (seven) days. (Patient not taking: Reported on 11/07/2018) 4 capsule 2   No facility-administered medications prior to visit.     No Known Allergies  ROS Review of Systems  Constitutional: Positive for fatigue (Poor sleep due to joint pain). Negative for chills and fever.  HENT: Negative for sore throat and trouble swallowing.   Eyes: Negative for photophobia and visual disturbance.  Respiratory: Negative for cough and shortness of breath.   Cardiovascular: Negative for chest pain, palpitations and leg swelling.  Gastrointestinal: Negative for abdominal pain (Occasional discomfort in mid upper abdomen), blood in stool, constipation, diarrhea, nausea and vomiting.  Endocrine: Negative for cold intolerance, heat intolerance, polydipsia, polyphagia and polyuria.  Genitourinary: Positive for vaginal discharge. Negative for dysuria and frequency.  Musculoskeletal: Positive for arthralgias, back pain and myalgias. Negative for gait problem and joint swelling.  Skin: Negative for rash and wound.  Neurological: Negative for dizziness and headaches.  Hematological: Negative for adenopathy. Does not bruise/bleed easily.  Psychiatric/Behavioral: Negative for self-injury and suicidal ideas. The patient is not nervous/anxious.       Objective:    Physical Exam  Constitutional: She appears well-developed and well-nourished.  Well-nourished well-developed overweight for height older female in no acute distress.  Patient wearing mask as per [id number] office policy  Neck: Normal range of motion. Neck supple. No JVD present. No thyromegaly present.    Cardiovascular: Normal rate and regular rhythm.  Pulmonary/Chest: Effort normal and breath sounds normal.  No axillary adenopathy, no palpable masses on breast exam, no skin changes and no nipple discharge  Abdominal: Soft. There is no abdominal tenderness. There is no rebound and no guarding.  Genitourinary:    Vaginal discharge present.     Genitourinary Comments: Normal appearance to the cervix, no adnexal tenderness or CVA tenderness on bimanual exam.  Presence of whitish, slightly thick discharge within the vaginal canal near and posterior cervix.  Mild friability of the cervix with collection of sample for Pap smear   Musculoskeletal:        General: Tenderness present. No edema.     Comments: Patient with tenderness to palpation over the lateral left upper arm and  patient with tenderness to palpation along the upper chest musculature near the left mid clavicle and anterior shoulder.  Patient with decreased left shoulder range of motion as she is unable to lift the left arm to shoulder level/horizontal and positive empty can sign at the left shoulder  Lymphadenopathy:    She has no cervical adenopathy.  Neurological: She is alert.  Skin: Skin is warm and dry.  Psychiatric: She has a normal mood and affect. Her behavior is normal.  Nursing note and vitals reviewed.   BP 128/77 (BP Location: Left Arm, Patient Position: Sitting, Cuff Size: Large)   Pulse 80   Temp 98.3 F (36.8 C) (Oral)   Resp 18   Ht 5\' 2"  (1.575 m)   Wt 185 lb (83.9 kg)   SpO2 98%   BMI 33.84 kg/m  Wt Readings from Last 3 Encounters:  06/22/19 185 lb (83.9 kg)  11/07/18 182 lb (82.6 kg)  09/09/16 189 lb 9.6 oz (86 kg)     Health Maintenance Due  Topic Date Due  . HIV Screening  12/23/1982  . TETANUS/TDAP  12/23/1986  . COLONOSCOPY  12/22/2017  . INFLUENZA VACCINE  05/05/2019   She agreed to have influenza immunization at today's visit and she reports that she has done fecal occult blood test earlier  in the year  Lab Results  Component Value Date   TSH 3.552 11/26/2013   Lab Results  Component Value Date   WBC 4.7 11/19/2014   HGB 9.2 (L) 11/19/2014   HCT 31.3 (L) 11/19/2014   MCV 67.9 (L) 11/19/2014   PLT 299 11/19/2014   Lab Results  Component Value Date   NA 139 11/26/2013   K 4.4 11/26/2013   CO2 24 11/26/2013   GLUCOSE 95 11/26/2013   BUN 7 11/26/2013   CREATININE 0.43 (L) 11/26/2013   BILITOT 0.3 11/26/2013   ALKPHOS 62 11/26/2013   AST 17 11/26/2013   ALT 12 11/26/2013   PROT 7.3 11/26/2013   ALBUMIN 4.2 11/26/2013   CALCIUM 8.7 11/26/2013   Lab Results  Component Value Date   CHOL 159 11/26/2013   Lab Results  Component Value Date   HDL 52 11/26/2013   Lab Results  Component Value Date   LDLCALC 75 11/26/2013   Lab Results  Component Value Date   TRIG 159 (H) 11/26/2013   Lab Results  Component Value Date   CHOLHDL 3.1 11/26/2013   No results found for: HGBA1C    Assessment & Plan:  1. Well adult exam; 2. Screening for cervical cancer Patient had Pap done at today's visit as a screening test for cervical cancer.  Patient with normal mammogram on 11/07/2018-results in chart.  Information provided in Spanish on perimenopause as well as on preventative health information for patient's age group.  Patient will have lipid panel and basic metabolic panel at today's visit.  She also received influenza immunization at today's visit.  She also continues to have left shoulder pain but now with limited range of motion and she will be referred to orthopedics in follow-up of suspected rotator cuff tendinopathy/adhesive capsulitis. - Cytology - PAP([place]) - AMB referral to orthopedics - Lipid panel - Basic Metabolic Panel  2. Screening for cervical cancer Patient reports no history of Pap smears.  Pap smear done at today's visit and patient will be notified of the results - Cytology - PAP()  3. Vaginal discharge Patient with presence of  vaginal discharge on exam and describes issues  with vaginal itching.  She will be notified of the results of cervicovaginal ancillary testing and prescription sent to patient's pharmacy for Diflucan as she is suspected to have yeast infection as the source of her discharge and vaginal itching - Cervicovaginal ancillary only - fluconazole (DIFLUCAN) 150 MG tablet; Take 1 tablet (150 mg total) by mouth once for 1 dose.  Dispense: 1 tablet; Refill: 1   An After Visit Summary was printed and given to the patient.  Follow-up: Return for yearly and as needed; Orthopedic referral placed.   Antony Blackbird, MD

## 2019-06-23 ENCOUNTER — Other Ambulatory Visit: Payer: Self-pay | Admitting: Family Medicine

## 2019-06-23 DIAGNOSIS — Z791 Long term (current) use of non-steroidal anti-inflammatories (NSAID): Secondary | ICD-10-CM

## 2019-06-23 LAB — LIPID PANEL
Chol/HDL Ratio: 3.9 ratio (ref 0.0–4.4)
Cholesterol, Total: 218 mg/dL — ABNORMAL HIGH (ref 100–199)
HDL: 56 mg/dL
LDL Chol Calc (NIH): 124 mg/dL — ABNORMAL HIGH (ref 0–99)
Triglycerides: 215 mg/dL — ABNORMAL HIGH (ref 0–149)
VLDL Cholesterol Cal: 38 mg/dL (ref 5–40)

## 2019-06-23 LAB — BASIC METABOLIC PANEL WITH GFR
BUN/Creatinine Ratio: 14 (ref 9–23)
BUN: 7 mg/dL (ref 6–24)
CO2: 24 mmol/L (ref 20–29)
Calcium: 9.6 mg/dL (ref 8.7–10.2)
Chloride: 102 mmol/L (ref 96–106)
Creatinine, Ser: 0.51 mg/dL — ABNORMAL LOW (ref 0.57–1.00)
GFR calc Af Amer: 129 mL/min/1.73
GFR calc non Af Amer: 112 mL/min/1.73
Glucose: 185 mg/dL — ABNORMAL HIGH (ref 65–99)
Potassium: 4.4 mmol/L (ref 3.5–5.2)
Sodium: 140 mmol/L (ref 134–144)

## 2019-06-23 MED ORDER — FAMOTIDINE 20 MG PO TABS: 20.0000 mg | ORAL_TABLET | Freq: Two times a day (BID) | ORAL | 11 refills | Status: DC

## 2019-06-23 NOTE — Progress Notes (Signed)
Patient ID: Elaine Burch, female   DOB: 1966-03-07, 53 y.o.   MRN: [id number]   At recent visit, patient with complaint of occasional mid upper abdominal discomfort and patient does take ibuprofen for joint pain.  Prescription will be sent to patient's pharmacy for Pepcid 20 mg twice daily for stomach protection however if this medication is not available will send message to pharmacist to substitute omeprazole 20 mg.  Patient will be notified.

## 2019-06-24 ENCOUNTER — Other Ambulatory Visit: Payer: Self-pay | Admitting: Family Medicine

## 2019-06-24 DIAGNOSIS — R7301 Impaired fasting glucose: Secondary | ICD-10-CM

## 2019-06-24 NOTE — Progress Notes (Signed)
Patient ID: Elaine Burch, female   DOB: 01-04-1966, 53 y.o.   MRN: [id number]   Elevated blood sugar on recent labs.  Patient had fasting glucose of 183 and will be contacted to make follow-up appointment in the office but if she cannot make office appointment in the next 2 weeks then she is asked to come in for hemoglobin A1c.  Lab order will be placed.

## 2019-06-25 ENCOUNTER — Other Ambulatory Visit: Payer: Self-pay | Admitting: Family Medicine

## 2019-06-25 DIAGNOSIS — G8929 Other chronic pain: Secondary | ICD-10-CM

## 2019-06-25 LAB — CERVICOVAGINAL ANCILLARY ONLY
Bacterial Vaginitis (gardnerella): NEGATIVE
Candida Glabrata: NEGATIVE
Candida Vaginitis: POSITIVE — AB
Molecular Disclaimer: NEGATIVE
Molecular Disclaimer: NEGATIVE
Molecular Disclaimer: NEGATIVE
Molecular Disclaimer: NORMAL
Trichomonas: NEGATIVE

## 2019-06-25 MED ORDER — FERROUS SULFATE 325 (65 FE) MG PO TABS: 325.0000 mg | ORAL_TABLET | Freq: Every day | ORAL | 3 refills | Status: DC

## 2019-06-25 MED ORDER — IBUPROFEN 600 MG PO TABS: 600.0000 mg | ORAL_TABLET | Freq: Three times a day (TID) | ORAL | 2 refills | Status: DC | PRN

## 2019-06-25 MED ORDER — CYCLOBENZAPRINE HCL 5 MG PO TABS: 5.0000 mg | ORAL_TABLET | Freq: Three times a day (TID) | ORAL | 1 refills | Status: DC | PRN

## 2019-06-25 NOTE — Telephone Encounter (Signed)
1) Medication(s) Requested (by name): ibprofen Ferrous sulfate cyclobenzaprine 2) Pharmacy of Choice: [facility] [place] [facility] [address]

## 2019-06-26 LAB — CERVICOVAGINAL ANCILLARY ONLY
Chlamydia: NEGATIVE
Neisseria Gonorrhea: NEGATIVE

## 2019-06-26 LAB — CYTOLOGY - PAP: Diagnosis: NEGATIVE

## 2019-06-27 ENCOUNTER — Other Ambulatory Visit: Payer: Self-pay | Admitting: Family Medicine

## 2019-06-27 DIAGNOSIS — Z791 Long term (current) use of non-steroidal anti-inflammatories (NSAID): Secondary | ICD-10-CM

## 2019-06-27 DIAGNOSIS — G8929 Other chronic pain: Secondary | ICD-10-CM

## 2019-06-27 DIAGNOSIS — M25512 Pain in left shoulder: Secondary | ICD-10-CM

## 2019-06-27 MED ORDER — FERROUS SULFATE 325 (65 FE) MG PO TABS: 325.0000 mg | ORAL_TABLET | Freq: Every day | ORAL | 3 refills | Status: DC

## 2019-06-27 MED ORDER — IBUPROFEN 600 MG PO TABS: 600.0000 mg | ORAL_TABLET | Freq: Three times a day (TID) | ORAL | 2 refills | Status: DC | PRN

## 2019-06-27 MED ORDER — FAMOTIDINE 20 MG PO TABS: 20.0000 mg | ORAL_TABLET | Freq: Two times a day (BID) | ORAL | 11 refills | Status: DC

## 2019-06-27 MED ORDER — CYCLOBENZAPRINE HCL 5 MG PO TABS: 5.0000 mg | ORAL_TABLET | Freq: Three times a day (TID) | ORAL | 3 refills | Status: DC | PRN

## 2019-06-27 NOTE — Progress Notes (Signed)
Patient ID: Elaine Burch, female   DOB: 11-02-1965, 53 y.o.   MRN: [id number]    Message received from [place] that patient would like refills of ibuprofen, flexeril and ferrous sulfate

## 2019-07-02 ENCOUNTER — Encounter: Payer: Self-pay | Admitting: Orthopaedic Surgery

## 2019-07-02 ENCOUNTER — Ambulatory Visit: Payer: Self-pay

## 2019-07-02 ENCOUNTER — Ambulatory Visit (INDEPENDENT_AMBULATORY_CARE_PROVIDER_SITE_OTHER): Payer: Self-pay | Admitting: Orthopaedic Surgery

## 2019-07-02 ENCOUNTER — Ambulatory Visit: Payer: No Typology Code available for payment source

## 2019-07-02 DIAGNOSIS — M25512 Pain in left shoulder: Secondary | ICD-10-CM

## 2019-07-02 DIAGNOSIS — G8929 Other chronic pain: Secondary | ICD-10-CM

## 2019-07-02 DIAGNOSIS — M7542 Impingement syndrome of left shoulder: Secondary | ICD-10-CM

## 2019-07-02 MED ORDER — LIDOCAINE HCL 1 % IJ SOLN
3.0000 mL | INTRAMUSCULAR | Status: AC | PRN
  Administered 2019-07-02: 3 mL

## 2019-07-02 MED ORDER — METHYLPREDNISOLONE ACETATE 40 MG/ML IJ SUSP
40.0000 mg | INTRAMUSCULAR | Status: AC | PRN
  Administered 2019-07-02: 40 mg via INTRA_ARTICULAR

## 2019-07-02 NOTE — Progress Notes (Signed)
Office Visit Note   Patient: Elaine Burch           Date of Birth: Apr 06, 1966           MRN: [id number] Visit Date: 07/02/2019              Requested by: Antony Blackbird, MD [place],  [place] [address] PCP: Antony Blackbird, MD   Assessment & Plan: Visit Diagnoses:  1. Chronic left shoulder pain   2. Impingement syndrome of left shoulder     Plan:   I do feel that she would benefit from a subacromial steroid injection today in the office and I explained this to the interpreter.  I explained the risk and benefits of injections as well she tolerated well.  She does need outpatient physical therapy to help improve her shoulder function and motion of the left side.  I gave her a note for work to restrict her overhead activities and to lift no greater than 10 to 15 pounds.  All question concerns were answered addressed.  We will see her back in about 4 weeks to see how she is doing clinically.  She may end up benefiting from an ultrasound-guided intra-articular steroid injection.  We will see how she is doing in 4 weeks.  Follow-Up Instructions: Return in about 4 weeks (around 07/30/2019).   Orders:  Orders Placed This Encounter  Procedures  . Large Joint Inj  . XR Shoulder Left   No orders of the defined types were placed in this encounter.     Procedures: Large Joint Inj: L subacromial bursa on 07/02/2019 3:38 PM Indications: pain and diagnostic evaluation Details: 22 G 1.5 in needle  Arthrogram: No  Medications: 3 mL lidocaine 1 %; 40 mg methylPREDNISolone acetate 40 MG/ML Outcome: tolerated well, no immediate complications Procedure, treatment alternatives, risks and benefits explained, specific risks discussed. Consent was given by the patient. Immediately prior to procedure a time out was called to verify the correct patient, procedure, equipment, support staff and site/side marked as required. Patient was prepped and draped in the usual sterile fashion.        Clinical Data: No additional findings.   Subjective: Chief Complaint  Patient presents with  . Left Shoulder - Pain  Patient comes in today with an interpreter with a chief complaint of left shoulder pain.  She is right-hand dominant she works in housekeeping and felt some type of pop in her shoulder in March of this year when she was working.  This was her left shoulder.  She slowly stopped using the shoulder and her range of motion and strength have gotten worse and she is developed significant pain with her left shoulder.  Her primary care physician did put her on ibuprofen as well as Flexeril.  Her mobility has been decreasing and she is trying to modify her activities at work to keep from hurting her shoulder.  She denies any neck pain and denies any numbness and tingling in the hand.  Her lack of shoulder motion and pain has definitely affecting her mobility, her quality of life and activities daily living.  She does work in housekeeping and is having a lot of problems lifting heavy objects and doing work that involves overhead work.  HPI  Review of Systems She currently denies any headache, chest pain, shortness of breath, fever, chills, nausea, vomiting  Objective: Vital Signs: There were no vitals taken for this visit.  Physical Exam She is alert and orient  x3 and in no acute distress Ortho Exam Examination of her right shoulder is normal examination of her left shoulder shows limitations with abduction as well as external rotation.  Her internal rotation with adduction is also significant limited.  She is developing a frozen shoulder. Specialty Comments:  No specialty comments available.  Imaging: No results found.   PMFS History: Patient Active Problem List   Diagnosis Date Noted  . Muscle spasm of back 11/24/2013  . Dental caries 11/24/2013  . Visual changes 11/24/2013   Past Medical History:  Diagnosis Date  . Known health problems: none     Family  History  Problem Relation Age of Onset  . Diabetes Mother   . Cancer Father     Past Surgical History:  Procedure Laterality Date  . NO PAST SURGERIES     Social History   Occupational History  . Not on file  Tobacco Use  . Smoking status: Never Smoker  . Smokeless tobacco: Never Used  Substance and Sexual Activity  . Alcohol use: No  . Drug use: No  . Sexual activity: Yes    Birth control/protection: None

## 2019-07-03 ENCOUNTER — Other Ambulatory Visit: Payer: Self-pay

## 2019-07-03 DIAGNOSIS — M25512 Pain in left shoulder: Secondary | ICD-10-CM

## 2019-07-03 DIAGNOSIS — G8929 Other chronic pain: Secondary | ICD-10-CM

## 2019-07-04 ENCOUNTER — Telehealth: Payer: Self-pay | Admitting: Family Medicine

## 2019-07-04 NOTE — Telephone Encounter (Signed)
Patient called to get their lab results. Please follow up.  °

## 2019-07-05 NOTE — Telephone Encounter (Signed)
Informed patient with results and she verbalized understanding.  

## 2019-07-09 ENCOUNTER — Other Ambulatory Visit: Payer: Self-pay

## 2019-07-09 ENCOUNTER — Ambulatory Visit: Payer: Self-pay | Attending: Family Medicine

## 2019-07-09 DIAGNOSIS — R7301 Impaired fasting glucose: Secondary | ICD-10-CM

## 2019-07-10 LAB — HEMOGLOBIN A1C
Est. average glucose Bld gHb Est-mCnc: 220 mg/dL
Hgb A1c MFr Bld: 9.3 % — ABNORMAL HIGH (ref 4.8–5.6)

## 2019-07-11 ENCOUNTER — Ambulatory Visit: Payer: No Typology Code available for payment source | Attending: Family Medicine | Admitting: Family Medicine

## 2019-07-11 ENCOUNTER — Other Ambulatory Visit: Payer: Self-pay

## 2019-07-11 DIAGNOSIS — Z91199 Patient's noncompliance with other medical treatment and regimen due to unspecified reason: Secondary | ICD-10-CM

## 2019-07-11 DIAGNOSIS — Z5329 Procedure and treatment not carried out because of patient's decision for other reasons: Secondary | ICD-10-CM

## 2019-07-14 ENCOUNTER — Other Ambulatory Visit: Payer: Self-pay | Admitting: Family Medicine

## 2019-07-14 DIAGNOSIS — E119 Type 2 diabetes mellitus without complications: Secondary | ICD-10-CM

## 2019-07-14 MED ORDER — METFORMIN HCL 500 MG PO TABS: 500.0000 mg | ORAL_TABLET | Freq: Two times a day (BID) | ORAL | 3 refills | Status: DC

## 2019-07-14 NOTE — Progress Notes (Signed)
Patient ID: Elaine Burch, female   DOB: 04/15/1966, 53 y.o.   MRN: [id number]   Patient with recent hemoglobin A1c of 9.3.  Patient with new diagnosis of diabetes.  Prescription will be sent to patient's pharmacy for metformin 500 mg to take 2 pills twice daily and will be notified to schedule follow-up appointment to discuss diagnosis of diabetes

## 2019-07-16 ENCOUNTER — Other Ambulatory Visit: Payer: Self-pay

## 2019-07-16 ENCOUNTER — Ambulatory Visit: Payer: No Typology Code available for payment source | Attending: Orthopaedic Surgery | Admitting: Physical Therapy

## 2019-07-16 ENCOUNTER — Encounter: Payer: Self-pay | Admitting: Physical Therapy

## 2019-07-16 DIAGNOSIS — M25512 Pain in left shoulder: Secondary | ICD-10-CM | POA: Insufficient documentation

## 2019-07-16 DIAGNOSIS — G8929 Other chronic pain: Secondary | ICD-10-CM | POA: Insufficient documentation

## 2019-07-16 DIAGNOSIS — M25612 Stiffness of left shoulder, not elsewhere classified: Secondary | ICD-10-CM

## 2019-07-16 DIAGNOSIS — M6281 Muscle weakness (generalized): Secondary | ICD-10-CM | POA: Insufficient documentation

## 2019-07-16 NOTE — Therapy (Signed)
[place], [place], [address] Phone: [phone]   Fax:  [phone]  Physical Therapy Evaluation  Patient Details  Name: Elaine Burch MRN: [id number] Date of Birth: 30-Jan-1966 Referring Provider (PT): Mcarthur Rossetti, MD   Encounter Date: 07/16/2019  PT End of Session - 07/16/19 1514    Visit Number  1    Number of Visits  18    Date for PT Re-Evaluation  08/27/19    Authorization Type  CAFA 100% until 12/23/19    PT Start Time  [id number]    PT Stop Time  1454    PT Time Calculation (min)  49 min    Activity Tolerance  Patient tolerated treatment well    Behavior During Therapy  [facility] for tasks assessed/performed       Past Medical History:  Diagnosis Date  . Known health problems: none     Past Surgical History:  Procedure Laterality Date  . NO PAST SURGERIES      There were no vitals filed for this visit.   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  Her mobility has been decreasing and she is trying to modify her activities at work to keep from hurting her shoulder.  She denies any neck pain and denies any numbness and tingling in the hand.  Her lack of shoulder motion and pain has definitely affecting her mobility, her quality of life and activities daily living.    Pertinent History  DM    Diagnostic tests  X-ray    Patient Stated Goals  brush her hair without pain    Currently in Pain?  Yes    Pain Score  7     Pain Location  Shoulder    Pain Orientation  Left    Pain Descriptors / Indicators  Throbbing;Stabbing    Pain Type  Chronic pain    Pain Radiating Towards  denies radiculopathy but did have some referred pain to her neck,  now only in her shoulder    Pain Onset  More than a month ago    Aggravating Factors   lifting her arm, laying on her shoulder (feels like it will pop out of socket)    Pain Relieving Factors  meds (NSAIDS)    Effect of Pain on Daily Activities  limits ADLS and work actvities involving using her Lt shoulder         [facility] PT Assessment - 07/16/19 0001      Assessment   Medical Diagnosis  Lt shoulder pain    Referring Provider (PT)  Mcarthur Rossetti, MD    Onset Date/Surgical Date  --   March 2020   Hand Dominance  Right    Next MD Visit  07/30/19    Prior Therapy  PT for back a long time ago      Restrictions   Other Position/Activity Restrictions  works in [place] for work to restrict her overhead activities and to lift no greater than 10 to 15 pounds.      Balance Screen   Has the patient fallen in the past 6 months  No      [place]  Private residence    Available Help at Discharge  Family      Prior Function   Level of Independence  Independent    Vocation  Part time employment    Vocation Requirements  housekeeping    Leisure  walking      Cognition   Overall Cognitive Status  Within Functional Limits for tasks assessed      Observation/Other Assessments   Focus on Therapeutic Outcomes (FOTO)   49% limited, 34% predicted      Sensation   Light Touch  Appears Intact      Coordination   Gross Motor Movements are Fluid and Coordinated  Yes      Posture/Postural Control   Posture Comments  slumped posture, rounded shoulders      ROM / Strength   AROM / PROM / Strength  AROM;PROM;Strength      AROM   AROM Assessment Site  Shoulder    Right/Left Shoulder  Left    Left Shoulder Flexion  105 Degrees    Left Shoulder ABduction  80 Degrees    Left Shoulder Internal Rotation  --   L4 (Rt: bra line)   Left Shoulder External Rotation  --   occiput     PROM   Overall PROM Comments  Hard end feel with flexion,  abduction    PROM Assessment Site  Shoulder    Right/Left Shoulder  Left    Left Shoulder Flexion  117 Degrees    Left Shoulder ABduction  80 Degrees    Left Shoulder Internal Rotation  --   [facility]   Left Shoulder External Rotation  30 Degrees      Strength   Overall Strength Comments  Lt shoulder flexion and abduction 4/5, ER 4/5, IR 4+/5; Grip Lt 4/5, Rt 5/5       Palpation   Palpation comment  TTP in Lt shoulder laterally, decreased GH mobility consistent with frozen shoulder      Special Tests   Other special tests  + impingment signs      Transfers   Transfers  --   min A for supine to sit               Objective measurements completed on examination: See above findings.      [facility] Adult PT Treatment/Exercise - 07/16/19 0001      Modalities   Modalities  --   cold pack to Lt shoulder 10 min with education            PT Education - 07/16/19 1512    Education Details  HEP, POC, exam findings,    Person(s) Educated  Patient    Methods  Explanation;Demonstration;Verbal cues;Handout    Comprehension  Verbalized understanding;Need further instruction          PT Long Term Goals - 07/16/19 1531      PT LONG TERM GOAL #1   Title  Pt will be I and compliant with HEP. (Target goal for all goals 6 weeks 08/27/19)    Status  New      PT LONG TERM GOAL #2   Title  Pt will improve Lt shoulder AROM to [facility] to improve mobility and function    Status  New      PT LONG TERM GOAL #3   Title  Pt will improve Lt shoulder strength to at least 5-/5 MMT to improve function    Status  New      PT LONG  TERM GOAL #4   Title  Pt will decrease pain overall to less than 3/10 with usual activity and sleeping    Status  New      PT LONG TERM GOAL #5   Title  Pt will improve FOTO to less than 34% to show improved function.    Status  New             Plan - 07/16/19 1515    Clinical Impression Statement  Pt presents with signs and symptoms of Lt frozen  shoulder. She has limited shoulder mobility and ROM, decreased strength, increased pain and decreased functional use of her Lt shoulder. She will benefit from skilled PT to address her deficits.    Personal Factors and Comorbidities  Comorbidity 1    Comorbidities  DM    Examination-Activity Limitations  Reach Overhead;Carry;Lift    Examination-Participation Restrictions  Cleaning;Meal Prep;Driving;Laundry    Stability/Clinical Decision Making  Evolving/Moderate complexity    Clinical Decision Making  Moderate    Rehab Potential  Good    PT Frequency  Other (comment)   2-3   PT Duration  6 weeks    PT Treatment/Interventions  ADLs/Self Care Home Management;Cryotherapy;Electrical Stimulation;Iontophoresis 4mg /ml Dexamethasone;Moist Heat;Ultrasound;Therapeutic activities;Therapeutic exercise;Neuromuscular re-education;Manual techniques;Passive range of motion;Dry needling;Joint Manipulations;Taping    PT Next Visit Plan  review HEP, needs [place] mobs,PROM and AAROM to increase ROM    PT Home Exercise Plan  pendulums, wand flexion, ER AAROM, wall walks, table slide abd.       Patient will benefit from skilled therapeutic intervention in order to improve the following deficits and impairments:  Decreased activity tolerance, Decreased mobility, Decreased strength, Decreased range of motion, Postural dysfunction, Pain  Visit Diagnosis: Chronic left shoulder pain  Stiffness of left shoulder, not elsewhere classified  Muscle weakness (generalized)     Problem List Patient Active Problem List   Diagnosis Date Noted  . Muscle spasm of back 11/24/2013  . Dental caries 11/24/2013  . Visual changes 11/24/2013    Debbe Odea ,PT,DPT 07/16/2019, 4:11 PM  [facility] [address] [place], [place], [address] Phone: [phone]   Fax:  [phone]  Name: Elaine Burch MRN: [id number] Date of Birth: December 25, 1965

## 2019-07-16 NOTE — Patient Instructions (Signed)
Access Code: [facility]  URL: [email or web address]  Date: 07/16/2019  Prepared by: Elsie Ra   Exercises  Circular Shoulder Pendulum with Table Support - 10 reps - 2 sets - 3x daily - 7x weekly  Supine Shoulder Flexion Extension AAROM with [place] - 10 reps - 1 sets - 3x daily - 7x weekly  Supine Shoulder External Rotation in 45 Degrees Abduction AAROM with [place] - 10 reps - 1 sets - 3x daily - 7x weekly  Standing Shoulder Internal Rotation Stretch with Towel - 10 reps - 1 sets - 3x daily - 7x weekly  Standing Shoulder Scaption Wall Walk - 10 reps - 1 sets - 5 hold - 3x daily - 7x weekly  Seated Shoulder Abduction Towel Slide at Table Top - 10 reps - 1-2 sets - 5 hold - 2x daily - 6x weekly

## 2019-07-17 ENCOUNTER — Ambulatory Visit: Payer: No Typology Code available for payment source | Admitting: Pharmacist

## 2019-07-26 ENCOUNTER — Ambulatory Visit: Payer: Self-pay | Admitting: Physical Therapy

## 2019-07-26 ENCOUNTER — Other Ambulatory Visit: Payer: Self-pay

## 2019-07-26 ENCOUNTER — Encounter: Payer: Self-pay | Admitting: Physical Therapy

## 2019-07-26 DIAGNOSIS — M25512 Pain in left shoulder: Secondary | ICD-10-CM

## 2019-07-26 DIAGNOSIS — M25612 Stiffness of left shoulder, not elsewhere classified: Secondary | ICD-10-CM

## 2019-07-26 DIAGNOSIS — M6281 Muscle weakness (generalized): Secondary | ICD-10-CM

## 2019-07-26 DIAGNOSIS — G8929 Other chronic pain: Secondary | ICD-10-CM

## 2019-07-26 NOTE — Therapy (Signed)
[place], [place], [address] Phone: [phone]   Fax:  [phone]  Physical Therapy Treatment  Patient Details  Name: Elaine Burch MRN: [id number] Date of Birth: 1965/11/04 Referring Provider (PT): Mcarthur Rossetti, MD   Encounter Date: 07/26/2019  PT End of Session - 07/26/19 0847    Visit Number  2    Number of Visits  18    Date for PT Re-Evaluation  08/27/19    Authorization Type  CAFA 100% until 12/23/19    PT Start Time  0844    PT Stop Time  0930    PT Time Calculation (min)  46 min       Past Medical History:  Diagnosis Date  . Known health problems: none     Past Surgical History:  Procedure Laterality Date  . NO PAST SURGERIES      There were no vitals filed for this visit.  Subjective Assessment - 07/26/19 0844    Subjective  Pain does not feel as strong now.    Currently in Pain?  Yes    Pain Score  6     Pain Location  Shoulder    Pain Orientation  Left    Pain Descriptors / Indicators  Throbbing;Stabbing    Pain Type  Chronic pain                       [facility] Adult PT Treatment/Exercise - 07/26/19 0001      Shoulder Exercises: Supine   Other Supine Exercises  cane press ups, pullovers, ER       Shoulder Exercises: Pulleys   Flexion  2 minutes      Shoulder Exercises: ROM/Strengthening   Other ROM/Strengthening Exercises  Seated table slides flexion , abduction , ER       Modalities   Modalities  Electrical Stimulation      Electrical Stimulation   Electrical Stimulation Location  Left shoulder    Electrical Stimulation Action  IFC x 15 minutes    Electrical Stimulation Parameters  39mA    Electrical Stimulation Goals  Pain      Manual Therapy   Manual therapy comments  PROM, flexion, abduction , ER , IR                   PT Long Term Goals - 07/16/19 1531      PT LONG TERM GOAL #1   Title  Pt will be I and  compliant with HEP. (Target goal for all goals 6 weeks 08/27/19)    Status  New      PT LONG TERM GOAL #2   Title  Pt will improve Lt shoulder AROM to Elaine Burch to improve mobility and function    Status  New      PT LONG TERM GOAL #3   Title  Pt will improve Lt shoulder strength to at least 5-/5 MMT to improve function    Status  New      PT LONG TERM GOAL #4   Title  Pt will decrease pain overall to less than 3/10 with usual activity and sleeping    Status  New      PT LONG TERM GOAL #5   Title  Pt will improve FOTO to less than 34% to show improved function.    Status  New            Plan - 07/26/19 [id number]  Clinical Impression Statement  Pt reports a mild improvement in pain. She has increased pain with all ROM exercises and PROM. IFC and HMP used at end of session to calm down her pain. She reported feeling better at end of session.    PT Next Visit Plan  review HEP, needs [place] mobs,PROM and AAROM to increase ROM    PT Home Exercise Plan  pendulums, wand flexion, ER AAROM, wall walks, table slide abd.       Patient will benefit from skilled therapeutic intervention in order to improve the following deficits and impairments:  Decreased activity tolerance, Decreased mobility, Decreased strength, Decreased range of motion, Postural dysfunction, Pain  Visit Diagnosis: Stiffness of left shoulder, not elsewhere classified  Chronic left shoulder pain  Muscle weakness (generalized)     Problem List Patient Active Problem List   Diagnosis Date Noted  . Muscle spasm of back 11/24/2013  . Dental caries 11/24/2013  . Visual changes 11/24/2013    Dorene Ar, PTA 07/26/2019, 10:07 AM  [place], [place], [address] Phone: [phone]   Fax:  [phone]  Name: Elaine Burch MRN: [id number] Date of Birth: 12/07/1965

## 2019-07-30 ENCOUNTER — Ambulatory Visit: Payer: No Typology Code available for payment source | Admitting: Orthopaedic Surgery

## 2019-07-31 ENCOUNTER — Other Ambulatory Visit: Payer: Self-pay

## 2019-07-31 ENCOUNTER — Ambulatory Visit: Payer: Self-pay | Admitting: Physical Therapy

## 2019-07-31 DIAGNOSIS — M6281 Muscle weakness (generalized): Secondary | ICD-10-CM

## 2019-07-31 DIAGNOSIS — M25612 Stiffness of left shoulder, not elsewhere classified: Secondary | ICD-10-CM

## 2019-07-31 DIAGNOSIS — G8929 Other chronic pain: Secondary | ICD-10-CM

## 2019-07-31 DIAGNOSIS — M25512 Pain in left shoulder: Secondary | ICD-10-CM

## 2019-07-31 NOTE — Therapy (Signed)
[place], [place], [address] Phone: [phone]   Fax:  [phone]  Physical Therapy Treatment  Patient Details  Name: Elaine Burch MRN: [id number] Date of Birth: 03-13-1966 Referring Provider (PT): Mcarthur Rossetti, MD   Encounter Date: 07/31/2019  PT End of Session - 07/31/19 0759    Visit Number  3    Number of Visits  18    Date for PT Re-Evaluation  08/27/19    Authorization Type  CAFA 100% until 12/23/19    PT Start Time  0800    PT Stop Time  0846    PT Time Calculation (min)  46 min    Activity Tolerance  Patient tolerated treatment well    Behavior During Therapy  [facility] for tasks assessed/performed       Past Medical History:  Diagnosis Date  . Known health problems: none     Past Surgical History:  Procedure Laterality Date  . NO PAST SURGERIES      There were no vitals filed for this visit.  Subjective Assessment - 07/31/19 0806    Subjective  I do my exercises but it seems to hurt more. I am not able to sleep on left shoulder    Pertinent History  DM    Diagnostic tests  X-ray    Patient Stated Goals  brush her hair without pain    Currently in Pain?  Yes    Pain Location  Shoulder    Pain Orientation  Left    Pain Descriptors / Indicators  Throbbing;Stabbing    Pain Type  Chronic pain    Pain Onset  More than a month ago    Pain Frequency  Intermittent    Aggravating Factors   lifting her arm, cant lie on left side         [facility] PT Assessment - 07/31/19 0001      Assessment   Medical Diagnosis  Lt shoulder pain    Referring Provider (PT)  Mcarthur Rossetti, MD      AROM   AROM Assessment Site  Shoulder    Right/Left Shoulder  Left    Left Shoulder Flexion  110 Degrees    Left Shoulder ABduction  89 Degrees    Left Shoulder Internal Rotation  --   L-1   Left Shoulder External Rotation  --   C-5     PROM   Left Shoulder Flexion  122  Degrees   after TPDN   Left Shoulder ABduction  95 Degrees   after TPDN   Left Shoulder External Rotation  35 Degrees                   [facility] Adult PT Treatment/Exercise - 07/31/19 0001      Shoulder Exercises: Supine   Other Supine Exercises  cane press up , pullovers, ER  2 x 10      Shoulder Exercises: Standing   Other Standing Exercises  wall clock  4 minutes on left with towel to 12, 11 , 10 and 9      Shoulder Exercises: Pulleys   Flexion  2 minutes      Shoulder Exercises: ROM/Strengthening   Other ROM/Strengthening Exercises  Seated table slides flexion , abduction , ER       Moist Heat Therapy   Number Minutes Moist Heat  15 Minutes    Moist Heat Location  Shoulder   left  Manual Therapy   Manual Therapy  Joint mobilization;Passive ROM;Myofascial release    Manual therapy comments  skilled palpation with TPDN    Joint Mobilization  inf/post glide,  Joint distraction with IR/ER to tolerance imporved after TPDN    Myofascial Release  left pectoral muscles and left subscapular lateral left portions    Passive ROM  Left FLex, abd, IR and ER       Trigger Point Dry Needling - 07/31/19 0001    Consent Given?  Yes    Education Handout Provided  Yes    In "Spanish   Muscles Treated Upper Quadrant  Subscapularis;Pectoralis major;Pectoralis minor    left only   Pectoralis Major Response  Twitch response elicited;Palpable increased muscle length    Pectoralis Minor Response  Twitch response elicited;Palpable increased muscle length    Subscapularis Response  Twitch response elicited;Palpable increased muscle length           PT Education - 07/31/19 0952    Education Details  Pt educated on TPDN through interpreter and importance of movement thoughout the day to pain tolerance    Person(s) Educated  Patient;Other (comment)   interpreter   Methods  Explanation;Demonstration;Tactile cues;Verbal cues;Handout    Comprehension  Verbalized  understanding;Returned demonstration          PT Long Term Goals - 07/16/19 1531      PT LONG TERM GOAL #1   Title  Pt will be I and compliant with HEP. (Target goal for all goals 6 weeks 08/27/19)    Status  New      PT LONG TERM GOAL #2   Title  Pt will improve Lt shoulder AROM to [facility] to improve mobility and function    Status  New      PT LONG TERM GOAL #3   Title  Pt will improve Lt shoulder strength to at least 5-/5 MMT to improve function    Status  New      PT LONG TERM GOAL #4   Title  Pt will decrease pain overall to less than 3/10 with usual activity and sleeping    Status  New      PT LONG TERM GOAL #5   Title  Pt will improve FOTO to less than 34% to show improved function.    Status  New            Plan - 07/31/19 0950    Clinical Impression Statement  Pt reports that exericises increase her pain, but was explained through interpreter that movement will help her get through this phase of her frozen shoulder.  Pt was educated on TPDN and consented to [facility] for this session. and closely monitored with PT /video interpreter.  Pt after TPDN improved AROM to left shld flex 110, Abd 89,  PROM flex 122/ abd 95 and ER 35  Pt also was educated on additional exericses of wall clock to pt tolerance,and reinforced HEP  Pt is still unable to lie on left shoulder at night but understood importance of continuing movement.    Personal Factors and Comorbidities  Comorbidity 1    Comorbidities  DM    Examination-Activity Limitations  Reach Overhead;Carry;Lift    Examination-Participation Restrictions  Cleaning;Meal Prep;Driving;Laundry    Stability/Clinical Decision Making  Evolving/Moderate complexity    Clinical Decision Making  Moderate    Rehab Potential  Good    PT Frequency  Other (comment)    PT Duration  6 weeks    PT  Treatment/Interventions  ADLs/Self Care Home Management;Cryotherapy;Electrical Stimulation;Iontophoresis 4mg /ml Dexamethasone;Moist  Heat;Ultrasound;Therapeutic activities;Therapeutic exercise;Neuromuscular re-education;Manual techniques;Passive range of motion;Dry needling;Joint Manipulations;Taping    PT Next Visit Plan  review HEP, needs [place] mobs,PROM and AAROM to increase ROM    PT Home Exercise Plan  pendulums, wand flexion, ER AAROM, wall walks, table slide abd.wall clock       Patient will benefit from skilled therapeutic intervention in order to improve the following deficits and impairments:  Decreased activity tolerance, Decreased mobility, Decreased strength, Decreased range of motion, Postural dysfunction, Pain  Visit Diagnosis: Stiffness of left shoulder, not elsewhere classified  Chronic left shoulder pain  Muscle weakness (generalized)     Problem List Patient Active Problem List   Diagnosis Date Noted  . Muscle spasm of back 11/24/2013  . Dental caries 11/24/2013  . Visual changes 11/24/2013   Voncille Lo, PT Certified Exercise Expert for the Aging Adult  07/31/19 9:59 AM Phone: [phone] Fax: [place] [facility] [address] [place], [place], [address] Phone: [phone]   Fax:  [phone]  Name: Elaine Burch MRN: [id number] Date of Birth: 1966-05-02

## 2019-07-31 NOTE — Patient Instructions (Signed)
      Voncille Lo, PT Certified Exercise Expert for the Aging Adult  07/31/19 8:35 AM Phone: [phone] Fax: [phone]

## 2019-08-03 ENCOUNTER — Encounter: Payer: Self-pay | Admitting: Physical Therapy

## 2019-08-03 ENCOUNTER — Other Ambulatory Visit: Payer: Self-pay

## 2019-08-03 ENCOUNTER — Ambulatory Visit: Payer: Self-pay | Admitting: Physical Therapy

## 2019-08-03 DIAGNOSIS — M25612 Stiffness of left shoulder, not elsewhere classified: Secondary | ICD-10-CM

## 2019-08-03 DIAGNOSIS — M6281 Muscle weakness (generalized): Secondary | ICD-10-CM

## 2019-08-03 DIAGNOSIS — G8929 Other chronic pain: Secondary | ICD-10-CM

## 2019-08-03 DIAGNOSIS — M25512 Pain in left shoulder: Secondary | ICD-10-CM

## 2019-08-03 NOTE — Therapy (Signed)
[place], [place], [address] Phone: [phone]   Fax:  [phone]  Physical Therapy Treatment  Patient Details  Name: Elaine Burch MRN: [id number] Date of Birth: 1966/09/21 Referring Provider (PT): Mcarthur Rossetti, MD   Encounter Date: 08/03/2019  PT End of Session - 08/03/19 0807    Visit Number  4    Number of Visits  18    Date for PT Re-Evaluation  08/27/19    Authorization Type  CAFA 100% until 12/23/19    PT Start Time  0803    PT Stop Time  0853    PT Time Calculation (min)  50 min       Past Medical History:  Diagnosis Date  . Known health problems: none     Past Surgical History:  Procedure Laterality Date  . NO PAST SURGERIES      There were no vitals filed for this visit.  Subjective Assessment - 08/03/19 0807    Subjective  The dry needling helped. I feel more flexible.    Patient is accompained by:  Interpreter   Hector (video interpreter)   Currently in Pain?  Yes    Pain Score  6     Pain Location  Shoulder    Pain Orientation  Left    Pain Descriptors / Indicators  Throbbing;Stabbing    Pain Type  Chronic pain         [facility] PT Assessment - 08/03/19 0001      PROM   Left Shoulder Flexion  130 Degrees   pain   Left Shoulder ABduction  90 Degrees   pain   Left Shoulder External Rotation  25 Degrees                   [facility] Adult PT Treatment/Exercise - 08/03/19 0001      Shoulder Exercises: Supine   Protraction  15 reps    Protraction Limitations  dowel    Other Supine Exercises  cane press up , pullovers, ER  2 x 10      Shoulder Exercises: Standing   Row  20 reps    Theraband Level (Shoulder Row)  Level 2 (Red)      Shoulder Exercises: Pulleys   Flexion  2 minutes      Shoulder Exercises: ROM/Strengthening   Other ROM/Strengthening Exercises  Seated table slides flexion , abduction , ER     Other ROM/Strengthening  Exercises  wall ladder x 10 flexion      Moist Heat Therapy   Number Minutes Moist Heat  15 Minutes    Moist Heat Location  Shoulder      Electrical Stimulation   Electrical Stimulation Location  Left shoulder    Electrical Stimulation Action  IC x 15 min    Electrical Stimulation Parameters  76ma    Electrical Stimulation Goals  Pain      Manual Therapy   Joint Mobilization  inf/post glide,  Joint distraction with IR/ER to tolerance    Passive ROM  Left FLex, abd, IR and ER                  PT Long Term Goals - 07/16/19 1531      PT LONG TERM GOAL #1   Title  Pt will be I and compliant with HEP. (Target goal for all goals 6 weeks 08/27/19)    Status  New      PT LONG TERM  GOAL #2   Title  Pt will improve Lt shoulder AROM to [facility] to improve mobility and function    Status  New      PT LONG TERM GOAL #3   Title  Pt will improve Lt shoulder strength to at least 5-/5 MMT to improve function    Status  New      PT LONG TERM GOAL #4   Title  Pt will decrease pain overall to less than 3/10 with usual activity and sleeping    Status  New      PT LONG TERM GOAL #5   Title  Pt will improve FOTO to less than 34% to show improved function.    Status  New            Plan - 08/03/19 0915    Clinical Impression Statement  Pt reports improved flexibility after TPDN however still with 6/10 pain. PROM flexion 130 today, ER limited to 25. Pain with end range stretching and PROM. IFC and HMP used at end of session to decrease pain.    PT Next Visit Plan  review HEP, needs [place] mobs,PROM and AAROM to increase ROM    PT Home Exercise Plan  pendulums, wand flexion, ER AAROM, wall walks, table slide abd.wall clock       Patient will benefit from skilled therapeutic intervention in order to improve the following deficits and impairments:  Decreased activity tolerance, Decreased mobility, Decreased strength, Decreased range of motion, Postural dysfunction, Pain  Visit  Diagnosis: Stiffness of left shoulder, not elsewhere classified  Chronic left shoulder pain  Muscle weakness (generalized)     Problem List Patient Active Problem List   Diagnosis Date Noted  . Muscle spasm of back 11/24/2013  . Dental caries 11/24/2013  . Visual changes 11/24/2013    Dorene Ar, PTA 08/03/2019, 9:17 AM  [facility] [address] [place], [place], [address] Phone: [phone]   Fax:  [phone]  Name: Addysen Barmore MRN: [id number] Date of Birth: 1966-09-25

## 2019-08-06 ENCOUNTER — Encounter: Payer: Self-pay | Admitting: Physical Therapy

## 2019-08-06 ENCOUNTER — Ambulatory Visit: Payer: Self-pay | Attending: Orthopaedic Surgery | Admitting: Physical Therapy

## 2019-08-06 ENCOUNTER — Encounter: Payer: Self-pay | Admitting: Orthopaedic Surgery

## 2019-08-06 ENCOUNTER — Ambulatory Visit (INDEPENDENT_AMBULATORY_CARE_PROVIDER_SITE_OTHER): Payer: Self-pay | Admitting: Orthopaedic Surgery

## 2019-08-06 ENCOUNTER — Other Ambulatory Visit: Payer: Self-pay

## 2019-08-06 DIAGNOSIS — M25512 Pain in left shoulder: Secondary | ICD-10-CM | POA: Insufficient documentation

## 2019-08-06 DIAGNOSIS — M25612 Stiffness of left shoulder, not elsewhere classified: Secondary | ICD-10-CM | POA: Insufficient documentation

## 2019-08-06 DIAGNOSIS — G8929 Other chronic pain: Secondary | ICD-10-CM | POA: Insufficient documentation

## 2019-08-06 DIAGNOSIS — M7542 Impingement syndrome of left shoulder: Secondary | ICD-10-CM

## 2019-08-06 DIAGNOSIS — M6281 Muscle weakness (generalized): Secondary | ICD-10-CM | POA: Insufficient documentation

## 2019-08-06 NOTE — Therapy (Signed)
[place], [place], [address] Phone: [phone]   Fax:  [phone]  Physical Therapy Treatment  Patient Details  Name: Elaine Burch MRN: [id number] Date of Birth: [birth year] Referring Provider (PT): Mcarthur Rossetti, MD   Encounter Date: 08/06/2019  PT End of Session - 08/06/19 1237    Visit Number  5    Number of Visits  18    Date for PT Re-Evaluation  08/27/19    Authorization Type  CAFA 100% until 12/23/19    PT Start Time  1232    PT Stop Time  1325    PT Time Calculation (min)  53 min       Past Medical History:  Diagnosis Date  . Known health problems: none     Past Surgical History:  Procedure Laterality Date  . NO PAST SURGERIES      There were no vitals filed for this visit.  Subjective Assessment - 08/06/19 1237    Subjective  A little less pain.    Patient is accompained by:  Interpreter   Video interpreter Cristie Hem   Currently in Pain?  Yes    Pain Score  5     Pain Location  Shoulder    Pain Orientation  Left    Pain Descriptors / Indicators  Throbbing;Stabbing    Pain Type  Chronic pain    Aggravating Factors   lifting arm ,laying on left side    Pain Relieving Factors  meds         [facility] PT Assessment - 08/06/19 0001      PROM   Left Shoulder Flexion  135 Degrees    Left Shoulder ABduction  100 Degrees    Left Shoulder External Rotation  35 Degrees                   [facility] Adult PT Treatment/Exercise - 08/06/19 0001      Shoulder Exercises: Supine   Protraction  15 reps    Protraction Limitations  dowel    Horizontal ABduction  15 reps    Theraband Level (Shoulder Horizontal ABduction)  Level 1 (Yellow)    External Rotation  Both;15 reps    Theraband Level (Shoulder External Rotation)  Level 1 (Yellow)    Other Supine Exercises  left sustained hold  at 90 degrees flexion , with right yellow band extension pulls x15     Other Supine  Exercises  cane press up , pullovers, ER  2 x 10      Shoulder Exercises: Standing   Row  20 reps    Theraband Level (Shoulder Row)  Level 2 (Red)    Other Standing Exercises  lateral spider walks on wall yellow band then diagonal on wall     Other Standing Exercises  wall clock  4 minutes on left with towel to 12, 11 , 10 and 9      Shoulder Exercises: Pulleys   Flexion  2 minutes      Shoulder Exercises: ROM/Strengthening   Other ROM/Strengthening Exercises  Seated table slides flexion , abduction , ER     Other ROM/Strengthening Exercises  wall ladder x 10 flexion      Shoulder Exercises: Stretch   External Rotation Stretch  3 reps   single arm in doorway      Moist Heat Therapy   Number Minutes Moist Heat  15 Minutes    Moist Heat Location  Shoulder  Acupuncturist Location  Left shoulder    Electrical Stimulation Action  IFC x 15 min    Electrical Stimulation Parameters  12 mA    Electrical Stimulation Goals  Pain      Manual Therapy   Joint Mobilization  inf/post glide,  Joint distraction with IR/ER to tolerance    Passive ROM  Left FLex, abd, IR and ER                  PT Long Term Goals - 07/16/19 1531      PT LONG TERM GOAL #1   Title  Pt will be I and compliant with HEP. (Target goal for all goals 6 weeks 08/27/19)    Status  New      PT LONG TERM GOAL #2   Title  Pt will improve Lt shoulder AROM to [facility] to improve mobility and function    Status  New      PT LONG TERM GOAL #3   Title  Pt will improve Lt shoulder strength to at least 5-/5 MMT to improve function    Status  New      PT LONG TERM GOAL #4   Title  Pt will decrease pain overall to less than 3/10 with usual activity and sleeping    Status  New      PT LONG TERM GOAL #5   Title  Pt will improve FOTO to less than 34% to show improved function.    Status  New            Plan - 08/06/19 1339    Clinical Impression Statement  Less pain and  improved PROM noted today. Improved tolerance to PROM into end range. Continued IFC to calm down pain after PROM.    PT Next Visit Plan  review HEP, needs [place] mobs,PROM and AAROM to increase ROM    PT Home Exercise Plan  pendulums, wand flexion, ER AAROM, wall walks, table slide abd.wall clock       Patient will benefit from skilled therapeutic intervention in order to improve the following deficits and impairments:  Decreased activity tolerance, Decreased mobility, Decreased strength, Decreased range of motion, Postural dysfunction, Pain  Visit Diagnosis: Stiffness of left shoulder, not elsewhere classified  Chronic left shoulder pain  Muscle weakness (generalized)     Problem List Patient Active Problem List   Diagnosis Date Noted  . Muscle spasm of back 11/24/2013  . Dental caries 11/24/2013  . Visual changes 11/24/2013    Dorene Ar, PTA 08/06/2019, 2:02 PM  [facility] [address] [place], [place], [address] Phone: [phone]   Fax:  [phone]  Name: Elaine Burch MRN: [id number] Date of Birth: April 28, 1966

## 2019-08-06 NOTE — Progress Notes (Signed)
The patient is a very pleasant 53 year old female that I am seeing for the second time.  She is with an interpreter today.  We have been following her for her left shoulder.  She injured the shoulder with repetitive activities that were work-related using heavy vacuum cleaner.  I did provide a steroid injection in the subacromial space at her last visit and we sent her to physical therapy as well.  She feels like her range of motion is improving and her pain is decreasing.  On examination of her left shoulder she still has significant decrease in motion of that shoulder with limitations in abduction as well as external rotation.  Also her internal rotation with adduction is limited.  She still exhibits pain in the shoulder with attempts at motion.  With abduction I am concerned that she is using more of her deltoid and the rotator cuff.  I would like her seen and evaluated by Dr. Junius Roads this week or next week with him assessing her left shoulder under ultrasound to determine whether or not she may have a rotator cuff tear.  Also would like him to provide an intra-articular steroid injection in the left shoulder glenohumeral joint.  He can then get her back to me in 2 to 4 weeks.  All question concerns were answered addressed.  She will also continue her physical therapy.

## 2019-08-08 ENCOUNTER — Encounter: Payer: Self-pay | Admitting: Physical Therapy

## 2019-08-08 ENCOUNTER — Other Ambulatory Visit: Payer: Self-pay

## 2019-08-08 ENCOUNTER — Ambulatory Visit: Payer: Self-pay | Admitting: Physical Therapy

## 2019-08-08 DIAGNOSIS — M25612 Stiffness of left shoulder, not elsewhere classified: Secondary | ICD-10-CM

## 2019-08-08 DIAGNOSIS — M25512 Pain in left shoulder: Secondary | ICD-10-CM

## 2019-08-08 DIAGNOSIS — G8929 Other chronic pain: Secondary | ICD-10-CM

## 2019-08-08 DIAGNOSIS — M6281 Muscle weakness (generalized): Secondary | ICD-10-CM

## 2019-08-08 NOTE — Therapy (Signed)
[place], [place], [address] Phone: [phone]   Fax:  [phone]  Physical Therapy Treatment  Patient Details  Name: Elaine Burch MRN: [id number] Date of Birth: 04/14/1966 Referring Provider (PT): Mcarthur Rossetti, MD   Encounter Date: 08/08/2019  PT End of Session - 08/08/19 0855    Visit Number  6    Number of Visits  18    Date for PT Re-Evaluation  08/27/19    Authorization Type  CAFA 100% until 12/23/19    PT Start Time  0805    PT Stop Time  0843    PT Time Calculation (min)  38 min    Activity Tolerance  Patient tolerated treatment well    Behavior During Therapy  [facility] for tasks assessed/performed       Past Medical History:  Diagnosis Date  . Known health problems: none     Past Surgical History:  Procedure Laterality Date  . NO PAST SURGERIES      There were no vitals filed for this visit.  Subjective Assessment - 08/08/19 0854    Subjective  Pt states she feels mobility is getting better. Had MD appt, but is going back again this week to have possible [place].    Currently in Pain?  Yes    Pain Score  5     Pain Location  Shoulder    Pain Orientation  Left    Pain Descriptors / Indicators  Throbbing    Pain Type  Chronic pain    Pain Onset  More than a month ago    Pain Frequency  Intermittent                       [facility] Adult PT Treatment/Exercise - 08/08/19 0818      Shoulder Exercises: Supine   Protraction  --    Protraction Limitations  --    Horizontal ABduction  15 reps    Theraband Level (Shoulder Horizontal ABduction)  Level 1 (Yellow)    External Rotation  --    Theraband Level (Shoulder External Rotation)  --    Other Supine Exercises  AROM flexion x10, 2lb flexion x10;     Other Supine Exercises  --      Shoulder Exercises: Standing   Row  20 reps    Theraband Level (Shoulder Row)  Level 3 (Green)    Other Standing Exercises   AROM: flexion to 110 deg x10; Abd to 90 deg x10;     Other Standing Exercises  wall clock  4 minutes on left with towel to 12, 11 , 10 and 9      Shoulder Exercises: Pulleys   Flexion  2 minutes    Scaption  1 minute      Shoulder Exercises: ROM/Strengthening   Other ROM/Strengthening Exercises  Seated table slides flexion , abduction , ER     Other ROM/Strengthening Exercises  wall ladder x 10 flexion      Shoulder Exercises: Stretch   Internal Rotation Stretch Limitations  Stick behind back x10, 5 sec holds.     External Rotation Stretch  --      Moist Heat Therapy   Moist Heat Location  Shoulder      Electrical Stimulation   Electrical Stimulation Location  --    Electrical Stimulation Goals  Pain      Manual Therapy   Joint Mobilization  Inf and post GHJ mobs  Passive ROM  Left FLex, abd, horizontal add, IR and ER                  PT Long Term Goals - 07/16/19 1531      PT LONG TERM GOAL #1   Title  Pt will be I and compliant with HEP. (Target goal for all goals 6 weeks 08/27/19)    Status  New      PT LONG TERM GOAL #2   Title  Pt will improve Lt shoulder AROM to [facility] to improve mobility and function    Status  New      PT LONG TERM GOAL #3   Title  Pt will improve Lt shoulder strength to at least 5-/5 MMT to improve function    Status  New      PT LONG TERM GOAL #4   Title  Pt will decrease pain overall to less than 3/10 with usual activity and sleeping    Status  New      PT LONG TERM GOAL #5   Title  Pt will improve FOTO to less than 34% to show improved function.    Status  New            Plan - 08/08/19 0857    Clinical Impression Statement  Pt showing improvments with PROM and AROM. Minimal pain with activities today. Still has joint stiffness at end of available range for all motions. Will benefit from continued work on ROM and function.    PT Next Visit Plan  review HEP, needs [place] mobs,PROM and AAROM to increase ROM    PT Home  Exercise Plan  pendulums, wand flexion, ER AAROM, wall walks, table slide abd.wall clock       Patient will benefit from skilled therapeutic intervention in order to improve the following deficits and impairments:  Decreased activity tolerance, Decreased mobility, Decreased strength, Decreased range of motion, Postural dysfunction, Pain  Visit Diagnosis: Stiffness of left shoulder, not elsewhere classified  Chronic left shoulder pain  Muscle weakness (generalized)     Problem List Patient Active Problem List   Diagnosis Date Noted  . Muscle spasm of back 11/24/2013  . Dental caries 11/24/2013  . Visual changes 11/24/2013    Lyndee Hensen, PT, DPT 9:01 AM  08/08/19    [facility] [address] [place], [place], [address] Phone: [phone]   Fax:  [phone]  Name: Aniayah Chiodini MRN: [id number] Date of Birth: October 12, 1965

## 2019-08-09 ENCOUNTER — Ambulatory Visit: Payer: Self-pay | Attending: Family Medicine | Admitting: Family Medicine

## 2019-08-09 ENCOUNTER — Ambulatory Visit: Payer: Self-pay

## 2019-08-09 ENCOUNTER — Encounter: Payer: Self-pay | Admitting: Family Medicine

## 2019-08-09 DIAGNOSIS — K5900 Constipation, unspecified: Secondary | ICD-10-CM

## 2019-08-09 DIAGNOSIS — M5441 Lumbago with sciatica, right side: Secondary | ICD-10-CM

## 2019-08-09 DIAGNOSIS — E119 Type 2 diabetes mellitus without complications: Secondary | ICD-10-CM

## 2019-08-09 DIAGNOSIS — M5442 Lumbago with sciatica, left side: Secondary | ICD-10-CM

## 2019-08-09 DIAGNOSIS — R519 Headache, unspecified: Secondary | ICD-10-CM

## 2019-08-09 LAB — POCT URINALYSIS DIP (CLINITEK)
Bilirubin, UA: NEGATIVE
Blood, UA: NEGATIVE
Glucose, UA: NEGATIVE mg/dL
Ketones, POC UA: NEGATIVE mg/dL
Leukocytes, UA: NEGATIVE
Nitrite, UA: NEGATIVE
POC PROTEIN,UA: NEGATIVE
Spec Grav, UA: 1.02
Urobilinogen, UA: 0.2 U/dL
pH, UA: 6

## 2019-08-09 MED ORDER — POLYETHYLENE GLYCOL 3350 17 GM/SCOOP PO POWD: ORAL | 11 refills | Status: DC

## 2019-08-09 MED ORDER — TRUE METRIX BLOOD GLUCOSE TEST VI STRP: ORAL_STRIP | 12 refills | Status: DC

## 2019-08-09 MED ORDER — TRUEPLUS LANCETS 28G MISC: 11 refills | Status: DC

## 2019-08-09 MED ORDER — TRUE METRIX METER W/DEVICE KIT: PACK | 0 refills | Status: DC

## 2019-08-09 MED FILL — !TRUE METRIX BLOOD GLUCOSE: 365 days supply | Qty: 1 | Fill #0

## 2019-08-09 MED FILL — TRUEplus LANCETS 28G MISC: 30 days supply | Qty: 100 | Fill #0

## 2019-08-09 MED FILL — POLYETHYLENE GLYCOL 3350 PO: 17 | 28 days supply | Qty: 476 | Fill #0

## 2019-08-09 MED FILL — TRUE METRIX TEST STRIP: 30 days supply | Qty: 100 | Fill #0

## 2019-08-09 NOTE — Patient Instructions (Signed)
Control de la glucemia, en adultos Blood Glucose Monitoring, Adult Controlar el nivel de azcar en la sangre (glucosa) es una parte importante del tratamiento de su diabetes (diabetes mellitus). El control de la glucemia implica realizar controles regulares segn le indiquen, y Catering manager un registro de los resultados (registro diario) a lo largo del [place]. Controlar la glucemia en forma peridica y llevar un registro diario puede:  Ayudarlos a usted y al mdico a Programmer, applications de control de la diabetes segn sea necesario, lo que incluye medicamentos o insulina.  Ayudarlo a usted a comprender de [employer], la actividad fsica, las enfermedades y los medicamentos inciden en la glucemia.  Permitirle a usted saber cul es su nivel de glucemia en cualquier momento. Averiguar rpidamente si su nivel de glucemia es bajo (hipoglucemia) o alto (hiperglucemia). El [employer] objetivos personalizados de su tratamiento. Los objetivos estarn basados en su edad, en otras enfermedades que tenga y en cmo responde al tratamiento de la diabetes. Generalmente, el objetivo del tratamiento es [employer] siguientes niveles de glucemia:  Antes de las comidas (preprandial): de 80 a 130mg /dl (4,4 a 7,85mmol/l).  Despus de las comidas (posprandial): por debajo de 180mg /dl (84mmol/l).  Nivel de A1c: menos del 7%. Materiales necesarios:  Medidor de glucemia.  Tiras reactivas para el medidor. Cada medidor tiene sus propias tiras reactivas. Debe usar las tiras reactivas que trajo su medidor.  Una aguja para pincharse el dedo (lanceta). No use una lanceta ms de una vez.  Un dispositivo que sujeta la lanceta (dispositivo de puncin).  Un diario o cuaderno de anotaciones para Pensions consultant. Cmo controlar su glucemia  1. Lvese las manos con agua y [place]. 2. Pnchese el costado del dedo (no en la punta) con la lanceta. Use un dedo diferente cada vez. 3. Frote suavemente  el dedo hasta que aparezca una pequea gota de [place]. 4. Siga las instrucciones que vienen con el medidor para Garment/textile technologist tira Comptroller, Midwife la sangre sobre la tira y usar el medidor de glucemia. 5. Registre su resultado y las observaciones que desee. Algunos medidores le permiten tomar sangre para la prueba de otras zonas del cuerpo que no son el dedo (zonas alternativas). Las zonas alternativas ms comunes son las siguientes:  Los Field seismologist.  Los muslos.  La palma de la mano. Si cree que puede tener hipoglucemia, o si tiene antecedentes de no saber cundo le est bajando el nivel de glucemia (hipoglucemia asintomtica), no use zonas alternativas. En su lugar, use los dedos. Es posible que las zonas alternativas no sean tan precisas como los dedos porque el flujo de sangre es ms lento en esas zonas. Esto significa que el resultado que obtiene de estas zonas puede estar retrasado y ser un poco diferente del resultado que obtendra de su dedo. Siga estas indicaciones en su casa: Registro diario de glucemia   Cada vez que controle su nivel de glucemia, anote el resultado. Tambin anote aquellos factores que puedan estar afectando su nivel de glucemia, como la dieta y la actividad fsica Designer, multimedia. Esta informacin puede ayudarlos a usted y a su mdico a: ? Charity fundraiser patrones en su glucemia durante el transcurso del [place]. ? Ajustar su plan de control de la diabetes segn sea necesario.  Averige si su Research officer, trade union sus registros en una computadora. [employer] de los medidores de glucosa guardan un registro de las lecturas realizadas con el medidor. Si usted tiene diabetes tipo  1:  Controle su nivel de glucemia 2 o ms veces al da.  Tambin controle su nivel de glucemia: ? Antes de cada inyeccin de insulina. ? Antes y despus de hacer ejercicio. ? Antes de comer. ? 2 horas despus de una comida. ? Ocasionalmente, entre las 2:00a.m. y las 3:00a.m., como  se lo hayan indicado. ? Antes de Optometrist tareas peligrosas, English as a second language teacher o usar maquinaria pesada. ? A la hora de acostarse.  Es posible que Geophysicist/field seismologist con ms frecuencia sus niveles de glucemia, hasta 6 a 10veces por da, si: ? Usted [place] una bomba de insulina. ? Usted necesita mltiples inyecciones diarias (MDI, por sus siglas en ingls). ? Tiene diabetes que no est bien controlada. ? Usted est enfermo. ? Usted tiene antecedentes de hipoglucemia grave. ? Usted tiene hipoglucemia asintomtica. Si usted tiene diabetes tipo 2:  Si recibe insulina u otros medicamentos para la diabetes, controle su nivel de glucemia 2 o ms veces al Training and development officer.  Si recibe tratamiento intensivo con insulina, debe medirse el nivel de glucemia 4o ms veces al [employer]. Ocasionalmente, es posible que deba controlarlo entre las 2:00a.m. y las 3:00a.m., segn se lo indiquen.  Tambin controle su nivel de glucemia: ? Antes y despus de hacer ejercicio. ? Antes de Optometrist tareas peligrosas, English as a second language teacher o usar maquinaria pesada.  Es posible que deba controlar con ms frecuencia su nivel de glucemia si: ? Necesita ajustar la dosis de sus medicamentos. ? Su diabetes no est bien controlada. ? Est enfermo. Consejos generales  Siempre tenga sus insumos a mano.  Todos los medidores de glucemia incluyen un nmero de telfono "directo", disponible las 24 horas, al que podr llamar si tiene preguntas o [place]. Tambin puede consultar a su mdico.  Despus de usar algunas cajas de tiras reactivas, ajuste (calibre) el medidor de glucemia segn las instrucciones del medidor. Comunquese con un mdico si:  El nivel de glucemia es mayor o igual que 240mg /dl (13,53mmol/dl) durante 2das seguidos.  Ha estado enfermo o ha tenido fiebre durante ms de 2das y no mejora.  Tiene alguno de los siguientes problemas durante ms de 6horas: ? No puede comer ni beber. ? Tiene nuseas o vmitos. ? Tiene  diarrea. Solicite ayuda de inmediato si:  Su nivel de glucemia est por debajo de 54mg /dl (106mmol/l).  Se siente confundido o tiene dificultad para pensar con claridad.  Tiene dificultad para respirar.  Tiene un nivel moderado o alto de cetonas en la [place]. Resumen  Controlar el nivel de azcar en la sangre (glucosa) es una parte importante del tratamiento de su diabetes (diabetes mellitus).  El control de la glucemia implica realizar controles regulares segn le indiquen, y Catering manager un registro de los resultados (registro diario) a lo largo del [place].  El [employer] objetivos personalizados de su tratamiento. Los objetivos estarn basados en su edad, en otras enfermedades que tenga y en cmo responde al tratamiento de la diabetes.  Cada vez que controle su nivel de glucemia, anote el resultado. Tambin anote aquellos factores que puedan estar afectando su nivel de glucemia, como la dieta y la actividad fsica Designer, multimedia. Esta informacin no tiene Marine scientist el consejo del mdico. Asegrese de hacerle al mdico cualquier pregunta que tenga. Document Released: 09/20/2005 Document Revised: 12/30/2017 Document Reviewed: 03/01/2016 Elsevier Patient Education  [place] de carbohidratos para la diabetes mellitus en los adultos Carbohydrate Counting for Diabetes Mellitus, Adult  El recuento de carbohidratos es un  mtodo para llevar un registro de la cantidad de carbohidratos que se ingieren. La ingesta natural de carbohidratos aumenta la cantidad de azcar (glucosa) en la sangre. El recuento de la cantidad de carbohidratos que se ingieren sirve para que el nivel de glucosa en sangre permanezca dentro de los lmites [place], lo que ayuda a Theatre manager la diabetes (diabetes mellitus) bajo control. Es importante saber la cantidad de carbohidratos que se pueden ingerir en cada comida sin correr Engineer, manufacturing. Esto es Psychologist, forensic. Un  especialista en alimentacin y nutricin (nutricionista certificado) puede ayudarlo a crear un plan de alimentacin y a calcular la cantidad de carbohidratos que debe ingerir en cada comida y colacin. Los siguientes alimentos incluyen carbohidratos:  Granos, como panes y cereales.  Frijoles secos y productos con soja.  Verduras con almidn, como papas, guisantes y maz.  Lambert Mody y jugos de frutas.  Leche y Estate agent.  Dulces y colaciones, como pasteles, galletas, caramelos, papas fritas de bolsa y refrescos. Cmo se calculan los carbohidratos? Hay dos maneras de calcular los carbohidratos de los alimentos. Puede usar cualquiera de los dos mtodos o [place] combinacin de [place]. Leer la etiqueta de "informacin nutricional" de los alimentos envasados La lista de "informacin nutricional" est incluida en las etiquetas de casi todas las bebidas y los alimentos envasados de los [place]. Incluye lo siguiente:  El tamao de la porcin.  Informacin sobre los nutrientes de cada porcin, incluidos los gramos (g) de carbohidratos por porcin. Para usar la "informacin nutricional":  Decida cuntas porciones va a comer.  Multiplique la cantidad de porciones por el nmero de carbohidratos por porcin.  El resultado es la cantidad total de carbohidratos que comer. Conocer los tamaos de las porciones estndar de otros alimentos Cuando coma alimentos que contengan carbohidratos y que no estn envasados o no incluyan la "informacin nutricional" en la etiqueta, debe medir las porciones para poder calcular la cantidad de carbohidratos:  Mida los alimentos que comer con una balanza de alimentos o una taza medidora, si es necesario.  Decida cuntas porciones de International aid/development worker.  Multiplique el nmero de porciones por15. La mayora de los alimentos con alto contenido de carbohidratos contienen unos 15g de carbohidratos por porcin. ? Por ejemplo, si come 8onzas (170g) de fresas,  habr comido 2porciones y 30g de carbohidratos (2porciones x 15g=30g).  En el caso de las comidas que contienen mezclas de ms de un alimento, como las sopas y los guisos, debe calcular los carbohidratos de cada alimento que se incluye. La siguiente lista contiene los tamaos de porciones estndar de los alimentos ricos en carbohidratos ms comunes. Cada una de estas porciones tiene aproximadamente 15g de carbohidratos:  pan de hamburguesa o muffin ingls.  onza (49ml) de jarabe.   onza (14g) de mermelada.  1rebanada de pan.  1tortilla de seis pulgadas.  3onzas (85g) de arroz o pasta cocidos.  4onzas (113g) de frijoles secos cocidos.  4onzas (113g) de verduras con almidn, como guisantes, maz o papas.  4onzas (113g) de cereal caliente.  4 onzas (113g) de pur de papas o de una papa grande al horno.  4onzas (113g) de frutas en lata o congeladas.  4onzas (188ml) de jugo de frutas.  4a 6galletas.  6croquetas de pollo.  6onzas (170g) de cereales secos sin azcar.  6onzas (170g) de yogur descremado sin ningn agregado o de yogur endulzado con edulcorante artificial.  8onzas (249ml) de [place].  8 onzas (170g) de frutas frescas o una fruta pequea.  24  onzas (680g) de palomitas de maz. Ejemplo de recuento de carbohidratos Ejemplo de comida  3 onzas (85g) de pechugas de pollo.  6onzas (170g) de arroz integral.  4onzas (113g) de maz.  8onzas (250ml) de leche.  8onzas (170g) de fresas con crema batida sin azcar. Clculo de carbohidratos 1. Identifique los alimentos que contienen carbohidratos: ? Arroz. ? Maz. ? Leche. ? Hughie Closs. 2. Calcule cuntas porciones come de cada alimento: ? 2 porciones de arroz. ? 1 porcin de maz. ? 1 porcin de leche. ? 1 porcin de fresas. 3. Multiplique cada nmero de porciones por 15g: ? 2 porciones de arroz x 15 g = 30 g. ? 1 porcin de maz x 15 g = 15 g. ? 1 porcin de  leche x 15 g = 15 g. ? 1 porcin de fresas x 15 g = 15 g. 4. Sume todas las cantidades para conocer el total de gramos de carbohidratos consumidos: ? 30g + 15g + 15g + 15g = 75g de carbohidratos en total. Resumen  El recuento de carbohidratos es un mtodo para llevar un registro de la cantidad de carbohidratos que se ingieren.  La ingesta natural de carbohidratos aumenta la cantidad de azcar (glucosa) en la sangre.  El recuento de la cantidad de carbohidratos que se ingieren sirve para Advertising account executive de glucosa en sangre dentro de los lmites [place], lo que ayuda a Theatre manager la diabetes bajo control.  Un especialista en alimentacin y nutricin (nutricionista certificado) puede ayudarlo a crear un plan de alimentacin y a calcular la cantidad de carbohidratos que debe ingerir en cada comida y colacin. Esta informacin no tiene Marine scientist el consejo del mdico. Asegrese de hacerle al mdico cualquier pregunta que tenga. Document Released: 12/13/2011 Document Revised: 07/12/2017 Document Reviewed: 03/03/2016 Elsevier Patient Education  2020 [employer].

## 2019-08-09 NOTE — Progress Notes (Signed)
Patient verified DOB Patient has not taken medication. Patient has not eaten today. Patient complains of back pain radiating down her legs. Patient is aware of medication being at the pharmacy to help with this concern. Patient has not taken any of the medication to help with this concern. Patient complains of frontal HA intermittently.

## 2019-08-09 NOTE — Progress Notes (Signed)
Virtual Visit via Telephone Note  I connected with Elaine Burch on 08/09/19 at  8:30 AM EST by telephone and verified that I am speaking with the correct person using two identifiers.   I discussed the limitations, risks, security and privacy concerns of performing an evaluation and management service by telephone and the availability of in person appointments. I also discussed with the patient that there may be a patient responsible charge related to this service. The patient expressed understanding and agreed to proceed.  Patient Location: Parked Data processing manager: [facility] Office Others participating in call: Call initiated by [place], [place] who then transferred the call to me after obtaining a Spanish-speaking interpreter through [facility] interpretation services   History of Present Illness:        53 year old female who is seen in follow-up of newly diagnosed type 2 diabetes.  Patient also has several other complaints including recurrent frontal headache with ranges from about a 4-6 on a 0-to-10 scale and occurs about 3-4 times per week and has been ongoing.  Headaches are dull.  She does not have any nausea or sensitivity to noise during headaches.  She did have one episode in which she felt that the lights were too bright during a headache.  Patient also with complaint of back pain for about 2 months which radiates down both legs.  She recalls no injury to her back.  Back pain ranges between a 6-8 and is sharp in nature.  She states that she has been attending physical therapy for her shoulders but back pain is of new onset.         She reports that she has started the use of metformin that was prescribed for treatment of type 2 diabetes.  She does not have a glucometer to check her blood sugars.  She is no longer having any issues with increased thirst or frequent urination.  She denies any issues with abdominal pain, no nausea/vomiting or diarrhea since starting metformin.  She  actually has noticed some recent onset of constipation as she now has a bowel movement about every other day.  She does sometimes need to take medication in order to have a bowel movement.  She denies any blood in the stool and no black stools.           On review of systems, she denies any dizziness, no episodes of focal numbness or weakness.  She has had no chest pain or palpitations, no shortness of breath or cough and no change in appetite.  No unexplained weight loss or weight gain.  No recent fever or chills.  No sore throat or difficulty swallowing.  No peripheral edema.  Past Medical History:  Diagnosis Date  . Known health problems: none     Past Surgical History:  Procedure Laterality Date  . NO PAST SURGERIES      Family History  Problem Relation Age of Onset  . Diabetes Mother   . Cancer Father     Social History   Tobacco Use  . Smoking status: Never Smoker  . Smokeless tobacco: Never Used  Substance Use Topics  . Alcohol use: No  . Drug use: No     No Known Allergies     Observations/Objective: No vital signs or physical exam conducted as visit was done via telephone  Assessment and Plan: 1. Type 2 diabetes mellitus without complication, without long-term current use of insulin ([place]) Patient is encouraged to continue the use of metformin.  Prescription is being sent to lab for patient to obtain glucometer, test strips and lancets to begin testing her blood sugars at home and referral is being made for patient to meet with clinical pharmacist to make sure that patient is familiar with how to monitor her blood sugars, diet and blood sugar goals.  Patient will also have blood work including BMP, TSH and urinalysis.  Patient is currently in the parking lot of this office therefore she was asked to come in to pick up prescriptions and diabetic testing supplies and have blood work done while she is here. - Blood Glucose Monitoring Suppl (TRUE METRIX METER) w/Device KIT; Use  to check blood sugars 2-3 times per day  Dispense: 1 kit; Refill: 0 - glucose blood (TRUE METRIX BLOOD GLUCOSE TEST) test strip; Use as instructed to check blood sugar 2-3 times  Dispense: 100 each; Refill: 12 - TRUEplus Lancets 28G MISC; Use when checking blood sugars  Dispense: 100 each; Refill: 11 - POCT URINALYSIS DIP (CLINITEK) - Basic Metabolic Panel - TSH - Amb Referral to Clinical Pharmacist  2. Nonintractable episodic headache, unspecified headache type Patient may have migraine variant headache.  She may take over-the-counter Excedrin as needed to see if this helps with her headaches but if headaches are not improving then she may require further evaluation.  3. Acute bilateral low back pain with bilateral sciatica Patient with complaint of recent onset of low back pain with radiation without injury.  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  Because she is newly diagnosed with diabetes, prednisone will not be prescribed.  She cannot take nonsteroidal anti-inflammatories as needed for her back pain and she will also be referred to orthopedics for further evaluation and treatment. - Ambulatory referral to Orthopedic Surgery  4. Constipation, unspecified constipation type Patient with complaint of constipation.  Will check TSH to make sure that thyroid disorder is not contributing.  Prescription provided for generic MiraLAX to help with constipation.  She may need referral to GI for further evaluation if symptoms continue - TSH - polyethylene glycol powder (GLYCOLAX/MIRALAX) 17 GM/SCOOP powder; Use 17 gm scoop once per day mixed with fluid as needed to help with constipation  Dispense: 578 g; Refill: 11  Follow Up Instructions:Return in about 2 months (around 10/16/2019) for DM- Luke in 1-2 weeks .    I discussed the assessment and treatment plan with the patient. The patient was provided an opportunity to ask  questions and all were answered. The patient agreed with the plan and demonstrated an understanding of the instructions.   The patient was advised to call back or seek an in-person evaluation if the symptoms worsen or if the condition fails to improve as anticipated.  I provided 18 minutes of non-face-to-face time during this encounter.   Antony Blackbird, MD

## 2019-08-10 ENCOUNTER — Ambulatory Visit (INDEPENDENT_AMBULATORY_CARE_PROVIDER_SITE_OTHER): Payer: Self-pay | Admitting: Family Medicine

## 2019-08-10 ENCOUNTER — Other Ambulatory Visit: Payer: Self-pay

## 2019-08-10 ENCOUNTER — Encounter: Payer: Self-pay | Admitting: Family Medicine

## 2019-08-10 ENCOUNTER — Ambulatory Visit: Payer: Self-pay

## 2019-08-10 DIAGNOSIS — G8929 Other chronic pain: Secondary | ICD-10-CM

## 2019-08-10 DIAGNOSIS — M25512 Pain in left shoulder: Secondary | ICD-10-CM

## 2019-08-10 LAB — TSH: TSH: 1.88 u[IU]/mL (ref 0.450–4.500)

## 2019-08-10 LAB — BASIC METABOLIC PANEL WITH GFR
BUN/Creatinine Ratio: 15 (ref 9–23)
BUN: 7 mg/dL (ref 6–24)
CO2: 26 mmol/L (ref 20–29)
Calcium: 10.1 mg/dL (ref 8.7–10.2)
Chloride: 101 mmol/L (ref 96–106)
Creatinine, Ser: 0.47 mg/dL — ABNORMAL LOW (ref 0.57–1.00)
GFR calc Af Amer: 132 mL/min/1.73
GFR calc non Af Amer: 115 mL/min/1.73
Glucose: 113 mg/dL — ABNORMAL HIGH (ref 65–99)
Potassium: 4.3 mmol/L (ref 3.5–5.2)
Sodium: 141 mmol/L (ref 134–144)

## 2019-08-10 NOTE — Progress Notes (Signed)
Subjective: She is here for ultrasound evaluation of her left shoulder with possible injection if indicated.  Diagnostic ultrasound left shoulder: Unfortunately exam was limited due to body habitus.  Long head biceps tendon appears to be located in its groove.  Subscapularis tendon was poorly visualized but I did not see any gross tears.  Supraspinatus likewise was hard to visualize but did not appear torn.  Infraspinatus had no obvious tears.  Impression: Persistent left shoulder pain  Plan: Discussed with patient, could either try a glenohumeral injection or order MRI scan.  She would prefer to proceed with MRI scan and follow-up with Dr. Ninfa Linden afterward.  I will arrange this for her.

## 2019-08-13 ENCOUNTER — Telehealth: Payer: Self-pay | Admitting: *Deleted

## 2019-08-13 ENCOUNTER — Encounter: Payer: Self-pay | Admitting: Physical Therapy

## 2019-08-13 ENCOUNTER — Other Ambulatory Visit: Payer: Self-pay

## 2019-08-13 ENCOUNTER — Ambulatory Visit: Payer: Self-pay | Admitting: Physical Therapy

## 2019-08-13 DIAGNOSIS — M6281 Muscle weakness (generalized): Secondary | ICD-10-CM

## 2019-08-13 DIAGNOSIS — M25612 Stiffness of left shoulder, not elsewhere classified: Secondary | ICD-10-CM

## 2019-08-13 DIAGNOSIS — G8929 Other chronic pain: Secondary | ICD-10-CM

## 2019-08-13 NOTE — Therapy (Signed)
[place], [place], [address] Phone: [phone]   Fax:  [phone]  Physical Therapy Treatment  Patient Details  Name: Elaine Burch MRN: [id number] Date of Birth: [birth year] Referring Provider (PT): Mcarthur Rossetti, MD   Encounter Date: 08/13/2019  PT End of Session - 08/13/19 1243    Visit Number  7    Number of Visits  18    Date for PT Re-Evaluation  08/27/19    Authorization Type  CAFA 100% until 12/23/19    PT Start Time  [id number]    PT Stop Time  1238    PT Time Calculation (min)  53 min       Past Medical History:  Diagnosis Date  . Known health problems: none     Past Surgical History:  Procedure Laterality Date  . NO PAST SURGERIES      There were no vitals filed for this visit.  Subjective Assessment - 08/13/19 1149    Subjective  The shoulder is a 5/10 but I think it is more flexible. I do not have an appointment yet for the MRI. They are checking my insurance coverage.    Patient is accompained by:  Interpreter   August Albino, video interpreter   Currently in Pain?  Yes    Pain Score  5     Pain Location  Shoulder    Pain Orientation  Left    Pain Descriptors / Indicators  Aching    Pain Type  Chronic pain    Aggravating Factors   reaching, laying on left side    Pain Relieving Factors  rest         [facility] PT Assessment - 08/13/19 0001      PROM   Left Shoulder Flexion  135 Degrees    Left Shoulder ABduction  100 Degrees   pain   Left Shoulder External Rotation  35 Degrees   pain                  [facility] Adult PT Treatment/Exercise - 08/13/19 0001      Shoulder Exercises: Supine   Other Supine Exercises  cane press up , pullovers, ER  2 x 10      Shoulder Exercises: Standing   Internal Rotation  20 reps    Theraband Level (Shoulder Internal Rotation)  Level 1 (Yellow)    Extension  20 reps    Theraband Level (Shoulder Extension)  Level 3  (Green)    Row  20 reps    Theraband Level (Shoulder Row)  Level 3 (Green)    Other Standing Exercises  flexion wall slides , cabinet reaching lower shelf, unable to reach middle     Other Standing Exercises  wall circles with towel over head x 2 minutes       Shoulder Exercises: Pulleys   Flexion  2 minutes    Scaption  1 minute      Shoulder Exercises: ROM/Strengthening   Other ROM/Strengthening Exercises  wall ladder x 10 flexion      Shoulder Exercises: Stretch   Other Shoulder Stretches  standing cane , flex, scaption, IR, extension      Moist Heat Therapy   Number Minutes Moist Heat  15 Minutes    Moist Heat Location  Shoulder      Electrical Stimulation   Electrical Stimulation Location  Left shoulder    Electrical Stimulation Action  IFC x 15 min    Electrical  Stimulation Parameters  37mA    Electrical Stimulation Goals  Pain      Manual Therapy   Joint Mobilization  Inf and post GHJ mobs     Passive ROM  Left FLex, abd, horizontal add, IR and ER                  PT Long Term Goals - 07/16/19 1531      PT LONG TERM GOAL #1   Title  Pt will be I and compliant with HEP. (Target goal for all goals 6 weeks 08/27/19)    Status  New      PT LONG TERM GOAL #2   Title  Pt will improve Lt shoulder AROM to [facility] to improve mobility and function    Status  New      PT LONG TERM GOAL #3   Title  Pt will improve Lt shoulder strength to at least 5-/5 MMT to improve function    Status  New      PT LONG TERM GOAL #4   Title  Pt will decrease pain overall to less than 3/10 with usual activity and sleeping    Status  New      PT LONG TERM GOAL #5   Title  Pt will improve FOTO to less than 34% to show improved function.    Status  New            Plan - 08/13/19 1239    Clinical Impression Statement  Pt reports improved flexibility. Her MD ordered an MRI and she is awaiting unsurance approval to get scheduled for it. Continued working on ROM and gentle  strengthening.  She is most limited and painful into abduction.    Examination-Activity Limitations  Reach Overhead;Carry;Lift    Examination-Participation Restrictions  Cleaning;Meal Prep;Driving;Laundry    PT Next Visit Plan  review HEP, needs [place] mobs,PROM and AAROM to increase ROM    PT Home Exercise Plan  pendulums, wand flexion, ER AAROM, wall walks, table slide abd.wall clock       Patient will benefit from skilled therapeutic intervention in order to improve the following deficits and impairments:  Decreased activity tolerance, Decreased mobility, Decreased strength, Decreased range of motion, Postural dysfunction, Pain  Visit Diagnosis: Stiffness of left shoulder, not elsewhere classified  Chronic left shoulder pain  Muscle weakness (generalized)     Problem List Patient Active Problem List   Diagnosis Date Noted  . Muscle spasm of back 11/24/2013  . Dental caries 11/24/2013  . Visual changes 11/24/2013    Dorene Ar, PTA 08/13/2019, 12:44 PM  [facility] [address] [place], [place], [address] Phone: [phone]   Fax:  [phone]  Name: Elaine Burch MRN: [id number] Date of Birth: 1965-12-07

## 2019-08-15 ENCOUNTER — Ambulatory Visit: Payer: Self-pay | Admitting: Physical Therapy

## 2019-08-17 NOTE — Addendum Note (Signed)
Addended by: Daylene Posey T on: 08/17/2019 11:24 AM   Modules accepted: Orders

## 2019-08-20 ENCOUNTER — Encounter: Payer: Self-pay | Admitting: Physical Therapy

## 2019-08-20 ENCOUNTER — Other Ambulatory Visit: Payer: Self-pay

## 2019-08-20 ENCOUNTER — Ambulatory Visit: Payer: Self-pay | Admitting: Physical Therapy

## 2019-08-20 DIAGNOSIS — M6281 Muscle weakness (generalized): Secondary | ICD-10-CM

## 2019-08-20 DIAGNOSIS — M25612 Stiffness of left shoulder, not elsewhere classified: Secondary | ICD-10-CM

## 2019-08-20 DIAGNOSIS — G8929 Other chronic pain: Secondary | ICD-10-CM

## 2019-08-20 DIAGNOSIS — M25512 Pain in left shoulder: Secondary | ICD-10-CM

## 2019-08-20 NOTE — Therapy (Signed)
[place], [place], [address] Phone: [phone]   Fax:  [phone]  Physical Therapy Treatment  Patient Details  Name: Elaine Burch MRN: [id number] Date of Birth: [birth year] Referring Provider (PT): Mcarthur Rossetti, MD   Encounter Date: 08/20/2019  PT End of Session - 08/20/19 1358    Visit Number  8    Number of Visits  18    Date for PT Re-Evaluation  08/27/19    Authorization Type  CAFA 100% until 12/23/19    PT Start Time  1326    PT Stop Time  1428    PT Time Calculation (min)  62 min    Activity Tolerance  Patient limited by pain    Behavior During Therapy  [facility] for tasks assessed/performed       Past Medical History:  Diagnosis Date  . Known health problems: none     Past Surgical History:  Procedure Laterality Date  . NO PAST SURGERIES      There were no vitals filed for this visit.  Subjective Assessment - 08/20/19 1355    Subjective  Left shoulder pain still around 5/10. MRI is still awaiting insurance approval/not scheduled as of today's visit.    Patient is accompained by:  Interpreter   Irma # [phone]   Currently in Pain?  Yes    Pain Score  5     Pain Location  Shoulder    Pain Orientation  Left    Pain Descriptors / Indicators  Aching    Pain Type  Chronic pain    Pain Onset  More than a month ago    Pain Frequency  Constant    Aggravating Factors   reaching, left sidelying    Pain Relieving Factors  rest    Effect of Pain on Daily Activities  limits ability for reaching for ADLs and work duties, limits positional tolerance         [facility] PT Assessment - 08/20/19 0001      Observation/Other Assessments   Focus on Therapeutic Outcomes (FOTO)   40% limited                   [facility] Adult PT Treatment/Exercise - 08/20/19 0001      Shoulder Exercises: Supine   Other Supine Exercises  supine wand ER 2x10, supine wand press 2x10       Shoulder Exercises: Standing   Internal Rotation  20 reps    Theraband Level (Shoulder Internal Rotation)  Level 1 (Yellow)    Extension  20 reps    Theraband Level (Shoulder Extension)  Level 3 (Green)    Row  20 reps    Theraband Level (Shoulder Row)  Level 3 (Green)    Other Standing Exercises  flexion wall slide x 10 reps left hand on towel      Shoulder Exercises: Pulleys   Flexion  2 minutes    Scaption  1 minute      Moist Heat Therapy   Number Minutes Moist Heat  15 Minutes    Moist Heat Location  Shoulder      Electrical Stimulation   Electrical Stimulation Location  Left shoulder    Electrical Stimulation Action  IFC x 15 min    Electrical Stimulation Parameters  80-150 HZ    Electrical Stimulation Goals  Pain      Manual Therapy   Manual Therapy  Soft tissue mobilization    Joint Mobilization  Inf and post GHJ mobs grade I-III    Soft tissue mobilization  subscapularis and posterior shoulder    Passive ROM  Left FLex, abd, horizontal add, IR and ER             PT Education - 08/20/19 1357    Education Details  POC    Person(s) Educated  Patient    Methods  Explanation    Comprehension  Verbalized understanding          PT Long Term Goals - 08/20/19 1406      PT LONG TERM GOAL #1   Title  Pt will be I and compliant with HEP. (Target goal for all goals 6 weeks 08/27/19)    Baseline  met for HEP from eval, updates ongoing    Status  On-going      PT LONG TERM GOAL #2   Title  Pt will improve Lt shoulder AROM to [facility] to improve mobility and function    Baseline  ongoing-still limited particularly with ER    Status  On-going      PT LONG TERM GOAL #3   Title  Pt will improve Lt shoulder strength to at least 5-/5 MMT to improve function    Baseline  MMTs not retested today    Status  On-going      PT LONG TERM GOAL #4   Title  Pt will decrease pain overall to less than 3/10 with usual activity and sleeping    Baseline  5/10    Status  On-going       PT LONG TERM GOAL #5   Title  Pt will improve FOTO to less than 34% to show improved function.    Baseline  40% limited    Status  On-going            Plan - 08/20/19 1359    Clinical Impression Statement  Functionally pt. has made some improvements since eval as evidenced by 9% gain in FOTO score from baseline but still limited with left shoulder pain and capsular restriction of ROM which is most notable in ER. Still awaiting MRI as previously to rule out other shoulder pathology. Pt. would benefit form continued therapy for further progress to improve functional use of shoulder.    Personal Factors and Comorbidities  Comorbidity 1    Comorbidities  DM    Examination-Activity Limitations  Reach Overhead;Carry;Lift    Examination-Participation Restrictions  Cleaning;Meal Prep;Driving;Laundry    Clinical Decision Making  Moderate    Rehab Potential  Good    PT Frequency  Other (comment)    PT Duration  6 weeks    PT Treatment/Interventions  ADLs/Self Care Home Management;Cryotherapy;Electrical Stimulation;Iontophoresis 23m/ml Dexamethasone;Moist Heat;Ultrasound;Therapeutic activities;Therapeutic exercise;Neuromuscular re-education;Manual techniques;Passive range of motion;Dry needling;Joint Manipulations;Taping    PT Next Visit Plan  continue manual with PROM, joint mobs, STM prn, potential dry needling, continue work on shoulder stretching, light stabilization/strengthening as tolerated, modalities prn    PT Home Exercise Plan  pendulums, wand flexion, ER AAROM, wall walks, table slide abd.wall clock    Consulted and Agree with Plan of Care  Patient       Patient will benefit from skilled therapeutic intervention in order to improve the following deficits and impairments:  Decreased activity tolerance, Decreased mobility, Decreased strength, Decreased range of motion, Postural dysfunction, Pain  Visit Diagnosis: Stiffness of left shoulder, not elsewhere classified  Chronic  left shoulder pain  Muscle weakness (generalized)     Problem List  Patient Active Problem List   Diagnosis Date Noted  . Muscle spasm of back 11/24/2013  . Dental caries 11/24/2013  . Visual changes 11/24/2013    Beaulah Dinning, PT, DPT 08/20/19 2:20 PM  [facility] [address] [place], [place], [address] Phone: [phone]   Fax:  [phone]  Name: Elaine Burch MRN: [id number] Date of Birth: 1965/11/11

## 2019-08-24 ENCOUNTER — Encounter: Payer: Self-pay | Admitting: Physical Therapy

## 2019-08-24 ENCOUNTER — Ambulatory Visit: Payer: Self-pay | Admitting: Pharmacist

## 2019-08-24 ENCOUNTER — Ambulatory Visit: Payer: Self-pay | Admitting: Physical Therapy

## 2019-08-24 ENCOUNTER — Other Ambulatory Visit: Payer: Self-pay

## 2019-08-24 DIAGNOSIS — M25512 Pain in left shoulder: Secondary | ICD-10-CM

## 2019-08-24 DIAGNOSIS — G8929 Other chronic pain: Secondary | ICD-10-CM

## 2019-08-24 DIAGNOSIS — M25612 Stiffness of left shoulder, not elsewhere classified: Secondary | ICD-10-CM

## 2019-08-24 DIAGNOSIS — M6281 Muscle weakness (generalized): Secondary | ICD-10-CM

## 2019-08-24 NOTE — Therapy (Signed)
[place], [place], [address] Phone: [phone]   Fax:  [phone]  Physical Therapy Treatment/Recertification  Patient Details  Name: Elaine Burch MRN: [id number] Date of Birth: 1966/05/22 Referring Provider (PT): Mcarthur Rossetti, MD   Encounter Date: 08/24/2019  PT End of Session - 08/24/19 0851    Visit Number  9    Number of Visits  17    Date for PT Re-Evaluation  08/27/19    PT Start Time  0846    Activity Tolerance  Patient limited by pain    Behavior During Therapy  [facility] for tasks assessed/performed       Past Medical History:  Diagnosis Date  . Known health problems: none     Past Surgical History:  Procedure Laterality Date  . NO PAST SURGERIES      There were no vitals filed for this visit.  Subjective Assessment - 08/24/19 0855    Subjective  Left shld pain still around 7/10. today.  I was left with pain since last visit.    Patient is accompained by:  Elaine Burch, [phone]   Pertinent History  DM    Diagnostic tests  X-ray    Patient Stated Goals  brush her hair without pain    Currently in Pain?  Yes    Pain Score  7     Pain Location  Shoulder    Pain Orientation  Left    Pain Descriptors / Indicators  Aching    Pain Type  Chronic pain    Pain Radiating Towards  y pain on anterior LT shld down to LT elbow    Pain Onset  More than a month ago    Pain Frequency  Constant         [facility] PT Assessment - 08/24/19 0001      Observation/Other Assessments   Focus on Therapeutic Outcomes (FOTO)   40% limited   taken on 11-16/20     PROM   Left Shoulder Flexion  134 Degrees   ERP   Left Shoulder ABduction  95 Degrees   ERP   Left Shoulder Internal Rotation  50 Degrees    Left Shoulder External Rotation  30 Degrees      Strength   Overall Strength  Deficits    Right Shoulder Flexion  4+/5    Right Shoulder ABduction  4+/5    Right Shoulder  Internal Rotation  4+/5    Right Shoulder External Rotation  4+/5    Left Shoulder Flexion  3-/5    Left Shoulder ABduction  3-/5    Left Shoulder Internal Rotation  3-/5    Left Shoulder External Rotation  3-/5                   [facility] Adult PT Treatment/Exercise - 08/24/19 0001      Shoulder Exercises: Standing   External Rotation  Both;Strengthening;10 reps    Theraband Level (Shoulder External Rotation)  Level 2 (Red)    External Rotation Limitations  x2    Extension  20 reps    Theraband Level (Shoulder Extension)  Level 2 (Red)    Row  20 reps    Theraband Level (Shoulder Row)  Level 2 (Red)      Shoulder Exercises: Stretch   Other Shoulder Stretches  posterior capsule shoulder stretch 3 x 30      Iontophoresis   Type of Iontophoresis  Dexamethasone  Location  ant, LT shld    Dose  1cc     Time  Patch for 4-6 hours      Manual Therapy   Manual Therapy  Soft tissue mobilization    Joint Mobilization  Inf and post GHJ mobs grade I-III    Soft tissue mobilization  subscapularis and posterior shoulder    Passive ROM  Left FLex, abd, horizontal add, IR and ER             PT Education - 08/24/19 0919    Education Details  Added to HEP with subscapular stabilizers and Post capsule stretch. education on iontophoresis    Person(s) Educated  Patient;Other (comment)   interpreter   Methods  Explanation;Demonstration;Tactile cues;Verbal cues;Handout;Other (comment)   spanish   Comprehension  Verbalized understanding;Returned demonstration          PT Long Term Goals - 08/24/19 0929      PT LONG TERM GOAL #1   Title  Pt will be I and compliant with HEP. (Target goal for all goals additional  6 weeks 10/04/19)    Baseline  working on advancing HEP as able    Time  6    Period  Weeks    Status  On-going    Target Date  10/03/20      PT LONG TERM GOAL #2   Title  Pt will improve Lt shoulder AROM to [facility] to improve mobility and function    Baseline   limited ER  LT flex 134, abd 95, IR 50, ER 30    Time  6    Period  Weeks    Status  On-going    Target Date  10/04/19      PT LONG TERM GOAL #3   Title  Pt will improve Lt shoulder strength to at least 5-/5 MMT to improve function    Baseline  LT shld 3-/5 limitted by pain    Time  6    Period  Weeks    Status  On-going    Target Date  10/04/19      PT LONG TERM GOAL #4   Title  Pt will decrease pain overall to less than 3/10 with usual activity and sleeping    Baseline  7/10 today   working on pain management/ increasing AROM    Time  6    Period  Weeks    Status  On-going    Target Date  10/04/19      PT LONG TERM GOAL #5   Title  Pt will improve FOTO to less than 34% to show improved function.    Baseline  40% limited    Time  6    Period  Weeks    Status  On-going    Target Date  10/04/19            Plan - 08/24/19 1237    Clinical Impression Statement  Pt has made improvements and FOTO improved by 9% limitation but pt enters with 7/10 pain and is not able to do more advanced HEPdue to pain.  limited capsular tightness and pain restricts AROM  ( LT shld flex 134, abd, 95 , IR 50 and ER 30,  Pt with pain and given iontophoresis patch today to decrease inflammation and through interpreter explained benefits and precautians . Pt also given updated HEP in Spanish and through interpreter was aable to demonstrate.  Pt will benefit from continued skilled PT to reduce pain and  return to more functional use of LT UE  Requesting additiona 1 x a week for 6 weeks    Personal Factors and Comorbidities  Comorbidity 1    Comorbidities  DM    Examination-Activity Limitations  Reach Overhead;Carry;Lift    Examination-Participation Restrictions  Cleaning;Meal Prep;Driving;Laundry    Stability/Clinical Decision Making  Evolving/Moderate complexity    Clinical Decision Making  Moderate    Rehab Potential  Good    PT Frequency  1x / week    PT Duration  6 weeks   10-04-19   PT  Treatment/Interventions  ADLs/Self Care Home Management;Cryotherapy;Electrical Stimulation;Iontophoresis 4mg /ml Dexamethasone;Moist Heat;Ultrasound;Therapeutic activities;Therapeutic exercise;Neuromuscular re-education;Manual techniques;Passive range of motion;Dry needling;Joint Manipulations;Taping    PT Next Visit Plan  continue manual with PROM, joint mobs, STM prn, potential dry needling, continue work on shoulder stretching, light stabilization/strengthening as tolerated, modalities prn    PT Home Exercise Plan  pendulums, wand flexion, ER AAROM, wall walks, table slide abd.wall clockstanding scapular stablizers , posterior capsular stretch    Consulted and Agree with Plan of Care  Patient       Patient will benefit from skilled therapeutic intervention in order to improve the following deficits and impairments:  Decreased activity tolerance, Decreased mobility, Decreased strength, Decreased range of motion, Postural dysfunction, Pain  Visit Diagnosis: Stiffness of left shoulder, not elsewhere classified  Chronic left shoulder pain  Muscle weakness (generalized)     Problem List Patient Active Problem List   Diagnosis Date Noted  . Muscle spasm of back 11/24/2013  . Dental caries 11/24/2013  . Visual changes 11/24/2013   Voncille Lo, PT Certified Exercise Expert for the Aging Adult  08/24/19 12:43 PM Phone: [phone] Fax: [facility] [address] [place], [place], [address] Phone: [phone]   Fax:  [phone]  Name: Elaine Burch MRN: [id number] Date of Birth: [birth year]

## 2019-08-24 NOTE — Patient Instructions (Addendum)

## 2019-08-27 ENCOUNTER — Encounter: Payer: Self-pay | Admitting: Pharmacist

## 2019-08-27 ENCOUNTER — Other Ambulatory Visit: Payer: Self-pay

## 2019-08-27 ENCOUNTER — Ambulatory Visit: Payer: Self-pay | Attending: Family Medicine | Admitting: Pharmacist

## 2019-08-27 DIAGNOSIS — E119 Type 2 diabetes mellitus without complications: Secondary | ICD-10-CM

## 2019-08-27 LAB — GLUCOSE, POCT (MANUAL RESULT ENTRY): POC Glucose: 109 mg/dL — AB (ref 70–99)

## 2019-08-27 NOTE — Progress Notes (Signed)
    S:    PCP: Dr. Chapman Fitch   No chief complaint on file.  Patient arrives in good spirits.  Presents for diabetes evaluation, education, and management Patient was referred and last seen by Primary Care Provider on 08/09/19.   Patient reports Diabetes was diagnosed this year.   Family/Social History:  - DM (mother) - Tobacco: never smoker - Alcohol: denies   Insurance coverage/medication affordability: self pay  Patient reports adherence with medications.  Current diabetes medications include: metformin 1000 mg BID  Current hypertension medications include: none Current hyperlipidemia medications include: none   Patient denies hypoglycemic events.  Patient reported dietary habits:  - Reports cutting back in the amounts of beans, bread, tortillas  - Has eliminated sweets and sweetened beverages. - Uses only a teaspoon of sugar with 1 cup of coffee/day  Patient-reported exercise habits:  - Reports walking 2-3 times/wk   Patient denies nocturia. Patient denies neuropathy. Patient reports baseline blurred vision but no visual changes. Patient reports self foot exams.    O:  POCT glucose: 109   Lab Results  Component Value Date   HGBA1C 9.3 (H) 07/09/2019   There were no vitals filed for this visit.  Lipid Panel     Component Value Date/Time   CHOL 218 (H) 06/22/2019 1207   TRIG 215 (H) 06/22/2019 1207   HDL 56 06/22/2019 1207   CHOLHDL 3.9 06/22/2019 1207   CHOLHDL 3.1 11/26/2013 0903   VLDL 32 11/26/2013 0903   LDLCALC 124 (H) 06/22/2019 1207   Home fasting blood sugars: 96 - 113   2 hour post-meal/random blood sugars: 95 - 114.  Clinical Atherosclerotic Cardiovascular Disease (ASCVD): No  The 10-year ASCVD risk score Mikey Bussing DC Jr., et al., 2013) is: 2.9%   Values used to calculate the score:     Age: 53 years     Sex: Female     Is Non-Hispanic African American: No     Diabetic: Yes     Tobacco smoker: No     Systolic Blood Pressure: 0000000 mmHg     Is BP  treated: No     HDL Cholesterol: 56 mg/dL     Total Cholesterol: 218 mg/dL   A/P: Diabetes newly dx currently controlled based on home sugars. Patient is able to verbalize appropriate hypoglycemia management plan. Patient is adherent with medication. She is doing well with her diet and is exercising when able. I commended her on her improvement; no regimen changes recommended.  -Continued current regimen.  -Extensively discussed pathophysiology of diabetes, recommended lifestyle interventions, dietary effects on blood sugar control -Counseled on s/sx of and management of hypoglycemia -Next A1C anticipated 10/2019.   ASCVD risk - primary prevention in patient with diabetes. Last LDL is not controlled. ASCVD risk score is not >20%  - moderate intensity statin indicated. I recommend starting atorvastatin 20 mg. Will make PCP aware.   HM: UTD on influenza and tetanus vaccines. I recommend Pneumovax-23 as well. Will make PCP aware.   Written patient instructions provided.  Total time in face to face counseling 15 minutes.   Follow up PCP Clinic Visit in Jan.   Benard Halsted, PharmD, [place] [phone]

## 2019-08-27 NOTE — Patient Instructions (Signed)
Thank you for coming to see me today. Please do the following:  1. Continue your current regimen.  2. Continue checking blood sugars at home. 3. Continue making the lifestyle changes we've discussed together during our visit. Diet and exercise play a significant role in improving your blood sugars.  4. Follow-up with your PCP   Hypoglycemia or low blood sugar:   Low blood sugar can happen quickly and may become an emergency if not treated right away.   While this shouldn't happen often, it can be brought upon if you skip a meal or do not eat enough. Also, if your insulin or other diabetes medications are dosed too high, this can cause your blood sugar to go to low.   Warning signs of low blood sugar include: 1. Feeling shaky or dizzy 2. Feeling weak or tired  3. Excessive hunger 4. Feeling anxious or upset  5. Sweating even when you aren't exercising  What to do if I experience low blood sugar? 1. Check your blood sugar with your meter. If lower than 70, proceed to step 2.  2. Treat with 3-4 glucose tablets or 3 packets of regular sugar. If these aren't around, you can try hard candy. Yet another option would be to drink 4 ounces of fruit juice or 6 ounces of REGULAR soda.  3. Re-check your sugar in 15 minutes. If it is still below 70, do what you did in step 2 again. If has come back up, go ahead and eat a snack or small meal at this time.

## 2019-08-28 ENCOUNTER — Ambulatory Visit: Payer: Self-pay | Admitting: Physical Therapy

## 2019-08-28 DIAGNOSIS — M6281 Muscle weakness (generalized): Secondary | ICD-10-CM

## 2019-08-28 DIAGNOSIS — M25612 Stiffness of left shoulder, not elsewhere classified: Secondary | ICD-10-CM

## 2019-08-28 DIAGNOSIS — M25512 Pain in left shoulder: Secondary | ICD-10-CM

## 2019-08-28 DIAGNOSIS — G8929 Other chronic pain: Secondary | ICD-10-CM

## 2019-08-28 NOTE — Therapy (Signed)
[place], [place], [address] Phone: [phone]   Fax:  [phone]  Physical Therapy Treatment  Patient Details  Name: Elaine Burch MRN: [id number] Date of Birth: [birth year] Referring Provider (PT): Mcarthur Rossetti, MD   Encounter Date: 08/28/2019  PT End of Session - 08/28/19 0753    Visit Number  10    Number of Visits  17    Date for PT Re-Evaluation  08/27/19    Authorization Type  CAFA 100% until 12/23/19    PT Start Time  [id number]    PT Stop Time  0855    PT Time Calculation (min)  60 min    Activity Tolerance  Patient tolerated treatment well    Behavior During Therapy  [facility] for tasks assessed/performed       Past Medical History:  Diagnosis Date  . Known health problems: none     Past Surgical History:  Procedure Laterality Date  . NO PAST SURGERIES      There were no vitals filed for this visit.  Subjective Assessment - 08/28/19 0758    Subjective  an hour after I took patch off , I started having pain.  I did do my exericises but it hurts Pointing to anterior shoulder and then around her LT shld.  I am having problems with my mop at home/broom. unable to sleep on LT side at night    Patient is accompained by:  Interpreter   Verdis Frederickson [phone]   Pertinent History  DM    Diagnostic tests  X-ray    Patient Stated Goals  brush her hair without pain    Currently in Pain?  Yes    Pain Score  7     Pain Location  Shoulder    Pain Orientation  Left    Pain Type  Chronic pain    Pain Onset  More than a month ago    Pain Frequency  Constant    Aggravating Factors   reaching, mopping         [facility] PT Assessment - 08/28/19 0001      PROM   Left Shoulder Flexion  138 Degrees   after DN   Left Shoulder ABduction  105 Degrees   after DN   Left Shoulder Internal Rotation  68 Degrees    Left Shoulder External Rotation  50 Degrees                   [facility] Adult PT  Treatment/Exercise - 08/28/19 0001      Shoulder Exercises: Standing   External Rotation  Both;Strengthening;10 reps    Theraband Level (Shoulder External Rotation)  Level 2 (Red);Level 3 (Green)    External Rotation Limitations  x1 VC and TC required    Internal Rotation  20 reps    Theraband Level (Shoulder Internal Rotation)  Level 3 (Green)    Internal Rotation Limitations  VC and TC required    Extension  20 reps    Theraband Level (Shoulder Extension)  Level 3 (Green)    Extension Limitations  VC and TC required    Row  20 reps    Theraband Level (Shoulder Row)  Level 3 (Green)    Row Limitations  VC and TC required    Other Standing Exercises  wall clock with LT shoulder 2 x 10       Trigger Point Dry Needling - 08/28/19 0001    Consent Given?  Yes  Education Handout Provided  Yes    Muscles Treated Head and Neck  Upper trapezius   LT only   Muscles Treated Upper Quadrant  Subscapularis;Pectoralis minor;Pectoralis major;Biceps   LT only   Other Dry Needling  60 mm    Upper Trapezius Response  Twitch reponse elicited;Palpable increased muscle length    Pectoralis Major Response  Twitch response elicited;Palpable increased muscle length    Pectoralis Minor Response  Twitch response elicited;Palpable increased muscle length    Subscapularis Response  Twitch response elicited;Palpable increased muscle length   Marked response and increased AROM to 145 degrees   Biceps Response  Twitch response elicited           PT Education - 08/28/19 0807    Education Details  added wall clock and information about TPDN aftercare/precautians    Person(s) Educated  Patient;Other (comment)   interpreter   Methods  Explanation;Demonstration;Tactile cues;Verbal cues;Handout    Comprehension  Verbalized understanding;Returned demonstration          PT Long Term Goals - 08/28/19 0849      PT LONG TERM GOAL #1   Title  Pt will be I and compliant with HEP. (Target goal for all  goals additional  6 weeks 10/04/19)    Baseline  working on advancing HEP as able  pain limiting but better after DN    Time  6    Period  Weeks    Status  On-going      PT LONG TERM GOAL #2   Title  Pt will improve Lt shoulder AROM to [facility] to improve mobility and function    Baseline  limited ER  LT flex 138, abd 105, IR 68, ER 50    Time  6    Period  Weeks    Status  On-going      PT LONG TERM GOAL #3   Title  Pt will improve Lt shoulder strength to at least 5-/5 MMT to improve function    Baseline  LT shld 3-/5 limitted by pain    Time  6    Status  On-going      PT LONG TERM GOAL #4   Title  Pt will decrease pain overall to less than 3/10 with usual activity and sleeping    Baseline  down to 6/10 today    Time  6    Period  Weeks    Status  On-going      PT LONG TERM GOAL #5   Title  Pt will improve FOTO to less than 34% to show improved function.    Baseline  40% limited 9th visit    Time  6    Period  Weeks    Status  On-going            Plan - 08/28/19 [id number]    Clinical Impression Statement  Pt required VC and TC for all exericises to perform correctly.  Pt consented for second TPDN in one month.  Pt was closely monitored and thoroughly informed with interpreter.  Pt decreased pain form 7/10 to 6/10.  Pt gained AROM after DN.  LT shld flex to 138, abd 105, IT 68 and ER 50.  Pt will continue strengthening at home,  Unsure of compliance at home with exericise but will reinforce for future visits. now with increased AROM    Personal Factors and Comorbidities  Comorbidity 1    Comorbidities  DM    Examination-Activity Limitations  Reach Overhead;Carry;Lift  Examination-Participation Restrictions  Cleaning;Meal Prep;Driving;Laundry    Stability/Clinical Decision Making  Evolving/Moderate complexity    Clinical Decision Making  Moderate    PT Frequency  1x / week    PT Duration  6 weeks    PT Treatment/Interventions  ADLs/Self Care Home  Management;Cryotherapy;Electrical Stimulation;Iontophoresis 4mg /ml Dexamethasone;Moist Heat;Ultrasound;Therapeutic activities;Therapeutic exercise;Neuromuscular re-education;Manual techniques;Passive range of motion;Dry needling;Joint Manipulations;Taping    PT Next Visit Plan  continue manual with PROM, joint mobs, STM prn, potential dry needling, continue work on shoulder stretching, light stabilization/strengthening as tolerated, modalities prn    PT Home Exercise Plan  pendulums, wand flexion, ER AAROM, wall walks, table slide abd.wall clockstanding scapular stablizers , posterior capsular stretch    Consulted and Agree with Plan of Care  Patient       Patient will benefit from skilled therapeutic intervention in order to improve the following deficits and impairments:  Decreased activity tolerance, Decreased mobility, Decreased strength, Decreased range of motion, Postural dysfunction, Pain  Visit Diagnosis: Stiffness of left shoulder, not elsewhere classified  Chronic left shoulder pain  Muscle weakness (generalized)     Problem List Patient Active Problem List   Diagnosis Date Noted  . Muscle spasm of back 11/24/2013  . Dental caries 11/24/2013  . Visual changes 11/24/2013    Voncille Lo, PT Certified Exercise Expert for the Aging Adult  08/28/19 8:53 AM Phone: [phone] Fax: [facility] [address] [place], [place], [address] Phone: [phone]   Fax:  [phone]  Name: Elaine Burch MRN: [id number] Date of Birth: [birth year]

## 2019-08-28 NOTE — Patient Instructions (Addendum)
     Voncille Lo, PT Certified Exercise Expert for the Aging Adult  08/28/19 8:07 AM Phone: [phone] Fax: [phone]

## 2019-08-29 ENCOUNTER — Other Ambulatory Visit (HOSPITAL_BASED_OUTPATIENT_CLINIC_OR_DEPARTMENT_OTHER): Payer: Self-pay | Admitting: Family Medicine

## 2019-08-29 DIAGNOSIS — E782 Mixed hyperlipidemia: Secondary | ICD-10-CM

## 2019-08-29 DIAGNOSIS — E119 Type 2 diabetes mellitus without complications: Secondary | ICD-10-CM

## 2019-08-29 DIAGNOSIS — Z79899 Other long term (current) drug therapy: Secondary | ICD-10-CM

## 2019-08-29 MED ORDER — ATORVASTATIN CALCIUM 20 MG PO TABS: 20.0000 mg | ORAL_TABLET | Freq: Every day | ORAL | 11 refills | Status: DC

## 2019-08-29 NOTE — Progress Notes (Signed)
Patient ID: Elaine Burch, female   DOB: 01-04-1966, 53 y.o.   MRN: [id number]   Patient recently seen by clinical pharmacist in follow-up of DM and it was recommended that patient take atorvastatin 20 mg daily. This will be sent to pharmacy and patient notified and she will also be asked to follow-up for lab visit for LFT's in about 4 weeks after starting use of atorvastatin.

## 2019-08-29 NOTE — Telephone Encounter (Signed)
error 

## 2019-09-03 ENCOUNTER — Encounter: Payer: Self-pay | Admitting: Orthopaedic Surgery

## 2019-09-03 ENCOUNTER — Telehealth: Payer: Self-pay | Admitting: Family Medicine

## 2019-09-03 ENCOUNTER — Ambulatory Visit (INDEPENDENT_AMBULATORY_CARE_PROVIDER_SITE_OTHER): Payer: Self-pay | Admitting: Orthopaedic Surgery

## 2019-09-03 ENCOUNTER — Ambulatory Visit
Admission: RE | Admit: 2019-09-03 | Discharge: 2019-09-03 | Disposition: A | Discharge status: Home / Self Care | Payer: No Typology Code available for payment source | Source: Ambulatory Visit | Attending: Family Medicine | Admitting: Family Medicine

## 2019-09-03 ENCOUNTER — Other Ambulatory Visit: Payer: Self-pay

## 2019-09-03 DIAGNOSIS — M25512 Pain in left shoulder: Secondary | ICD-10-CM

## 2019-09-03 DIAGNOSIS — G8929 Other chronic pain: Secondary | ICD-10-CM

## 2019-09-03 NOTE — Telephone Encounter (Signed)
This patient is seeing Dr. Ninfa Linden today at 2:30 for this same shoulder. I would guess he would advise the patient of the results (and then possibly send her up here for an injection under [place]).

## 2019-09-03 NOTE — Telephone Encounter (Signed)
MRI does not show a rotator cuff tear.  There is a possible tear of the labrum cartilage in the socket area.  If desired, we could see her back and try a glenohumeral injection with ultrasound.  Alternatively, could return to Dr. Ninfa Linden to discuss possible arthroscopic surgery.

## 2019-09-03 NOTE — Telephone Encounter (Signed)
See below

## 2019-09-03 NOTE — Progress Notes (Signed)
The patient comes in today with an interpreter to go over an MRI of her left shoulder.  She is a 53 year old female that has been developing slowly left shoulder pain and weakness with decreased mobility and motion.  She has been through physical therapy.  I sent her to Dr. Junius Roads recently and he assessed her shoulder under ultrasound and did not find a rotator cuff tear.  He did not inject the shoulder per the patient's request in order to obtain an MRI of the shoulder to confirm that the rotator cuff is not torn.  She is here to review that MRI today.  She still has limited mobility on exam of her left shoulder with decreased abduction as well as external rotation.  Her internal rotation with adduction is also limited.  The MRI is reviewed and it does show mild tendinitis and tendinosis of the rotator cuff but no tear.  There is questionable some degenerative posterior labral tearing but the remainder of the shoulder looks good.  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  I do feel that at this point she would benefit from an ultrasound-guided intra-articular steroid injection in the left shoulder and possibly an injection in the [facility] joint as well since she is not a diabetic.  I do agree with her resuming physical therapy which apparently she may have later this week.  I would like to see her back myself in 4 weeks.  Hopefully we can get her into Dr. Junius Roads in the next 1 to 2 days for steroid injection in the left shoulder.  All questions and and concerns were answered and addressed through the interpreter.  The patient does agree with this treatment plan.

## 2019-09-04 ENCOUNTER — Other Ambulatory Visit: Payer: Self-pay

## 2019-09-04 DIAGNOSIS — M7542 Impingement syndrome of left shoulder: Secondary | ICD-10-CM

## 2019-09-04 DIAGNOSIS — M25512 Pain in left shoulder: Secondary | ICD-10-CM

## 2019-09-04 DIAGNOSIS — G8929 Other chronic pain: Secondary | ICD-10-CM

## 2019-09-05 ENCOUNTER — Ambulatory Visit (INDEPENDENT_AMBULATORY_CARE_PROVIDER_SITE_OTHER): Payer: No Typology Code available for payment source | Admitting: Family Medicine

## 2019-09-05 ENCOUNTER — Ambulatory Visit: Payer: Self-pay

## 2019-09-05 ENCOUNTER — Other Ambulatory Visit: Payer: Self-pay

## 2019-09-05 ENCOUNTER — Encounter: Payer: Self-pay | Admitting: Family Medicine

## 2019-09-05 ENCOUNTER — Telehealth: Payer: Self-pay

## 2019-09-05 DIAGNOSIS — M7542 Impingement syndrome of left shoulder: Secondary | ICD-10-CM

## 2019-09-05 DIAGNOSIS — G8929 Other chronic pain: Secondary | ICD-10-CM

## 2019-09-05 DIAGNOSIS — M25512 Pain in left shoulder: Secondary | ICD-10-CM

## 2019-09-05 MED ORDER — TRAMADOL HCL 50 MG PO TABS: 50.0000 mg | ORAL_TABLET | Freq: Four times a day (QID) | ORAL | 0 refills | Status: DC | PRN

## 2019-09-05 NOTE — Progress Notes (Signed)
Subjective: Patient is here for ultrasound-guided intra-articular left glenohumeral injection.  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  She has AC joint arthropathy as well.  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  She is slightly tender at the [facility] joint.  Procedure: Ultrasound-guided left glenohumeral injection: After sterile prep with Betadine, injected 8 cc 1% lidocaine without epinephrine and 40 mg methylprednisolone using a 22-gauge spinal needle, passing the needle through approach into the glenohumeral joint.  Injectate was seen filling the joint capsule.  She had very good pain relief during the immediate anesthetic phase and also improved range of motion.  She will follow-up with Dr. Ninfa Linden as scheduled.

## 2019-09-05 NOTE — Telephone Encounter (Signed)
[facility] called stating that a diagnosis is needed for the Rx for Tramadol.  Cb# is [phone].  Please advise.  Thank you.

## 2019-09-05 NOTE — Telephone Encounter (Signed)
I called and advised the pharmacist of the diagnoses of chronic left shoulder pain and left shoulder impingement syndrome.

## 2019-09-07 ENCOUNTER — Other Ambulatory Visit: Payer: Self-pay

## 2019-09-07 ENCOUNTER — Encounter: Payer: Self-pay | Admitting: Physical Therapy

## 2019-09-07 ENCOUNTER — Ambulatory Visit: Payer: Self-pay | Attending: Orthopaedic Surgery | Admitting: Physical Therapy

## 2019-09-07 DIAGNOSIS — M6281 Muscle weakness (generalized): Secondary | ICD-10-CM | POA: Insufficient documentation

## 2019-09-07 DIAGNOSIS — G8929 Other chronic pain: Secondary | ICD-10-CM | POA: Insufficient documentation

## 2019-09-07 DIAGNOSIS — M25512 Pain in left shoulder: Secondary | ICD-10-CM | POA: Insufficient documentation

## 2019-09-07 DIAGNOSIS — M25612 Stiffness of left shoulder, not elsewhere classified: Secondary | ICD-10-CM | POA: Insufficient documentation

## 2019-09-07 NOTE — Therapy (Signed)
[place], [place], [address] Phone: [phone]   Fax:  [phone]  Physical Therapy Treatment  Patient Details  Name: Elaine Burch MRN: [id number] Date of Birth: [birth year] Referring Provider (PT): Mcarthur Rossetti, MD   Encounter Date: 09/07/2019  PT End of Session - 09/07/19 0806    Visit Number  11    Number of Visits  17    Date for PT Re-Evaluation  10/04/19    Authorization Type  CAFA 100% until 12/23/19    PT Start Time  0800    PT Stop Time  [id number]    PT Time Calculation (min)  55 min       Past Medical History:  Diagnosis Date  . Known health problems: none     Past Surgical History:  Procedure Laterality Date  . NO PAST SURGERIES      There were no vitals filed for this visit.  Subjective Assessment - 09/07/19 0805    Subjective  Dry needling and the injection helped the pain. I can move it better.    Currently in Pain?  Yes    Pain Score  4     Pain Location  Shoulder    Pain Orientation  Left    Pain Descriptors / Indicators  Aching    Aggravating Factors   reaching , chores    Pain Relieving Factors  rest         [facility] PT Assessment - 09/07/19 0001      AROM   Left Shoulder Flexion  130 Degrees    Left Shoulder ABduction  --   scaption 125   Left Shoulder Internal Rotation  --   Reach t 12    Left Shoulder External Rotation  --   reach to occiput     PROM   Left Shoulder Flexion  140 Degrees    Left Shoulder Internal Rotation  80 Degrees    Left Shoulder External Rotation  50 Degrees                   [facility] Adult PT Treatment/Exercise - 09/07/19 0001      Shoulder Exercises: Standing   External Rotation  Both;Strengthening;10 reps    Theraband Level (Shoulder External Rotation)  Level 2 (Red);Level 3 (Green)    External Rotation Limitations  Verbal cues for retraction    Internal Rotation  20 reps    Theraband Level (Shoulder  Internal Rotation)  Level 3 (Green)    Extension  20 reps    Theraband Level (Shoulder Extension)  Level 3 (Green)    Row  20 reps    Theraband Level (Shoulder Row)  Level 3 (Green)    Other Standing Exercises  1# reaching to touch middle shelf, 1# curl to overhead press, 2# bicep curl , bent over single arm row -using counter 2# 20 reps    Other Standing Exercises  standing cane scaption, flexion, Extension, IR       Shoulder Exercises: Pulleys   Flexion  2 minutes    Scaption  1 minute      Shoulder Exercises: ROM/Strengthening   Other ROM/Strengthening Exercises  wall slides flexion and scaption with pillow case       Moist Heat Therapy   Number Minutes Moist Heat  10 Minutes    Moist Heat Location  Shoulder                  PT  Long Term Goals - 08/28/19 0849      PT LONG TERM GOAL #1   Title  Pt will be I and compliant with HEP. (Target goal for all goals additional  6 weeks 10/04/19)    Baseline  working on advancing HEP as able  pain limiting but better after DN    Time  6    Period  Weeks    Status  On-going      PT LONG TERM GOAL #2   Title  Pt will improve Lt shoulder AROM to [facility] to improve mobility and function    Baseline  limited ER  LT flex 138, abd 105, IR 68, ER 50    Time  6    Period  Weeks    Status  On-going      PT LONG TERM GOAL #3   Title  Pt will improve Lt shoulder strength to at least 5-/5 MMT to improve function    Baseline  LT shld 3-/5 limitted by pain    Time  6    Status  On-going      PT LONG TERM GOAL #4   Title  Pt will decrease pain overall to less than 3/10 with usual activity and sleeping    Baseline  down to 6/10 today    Time  6    Period  Weeks    Status  On-going      PT LONG TERM GOAL #5   Title  Pt will improve FOTO to less than 34% to show improved function.    Baseline  40% limited 9th visit    Time  6    Period  Weeks    Status  On-going            Plan - 09/07/19 [id number]    Clinical Impression  Statement  Pt arrives reporting decreased pain after TPDN and shoulder injection. She reports she can lift her arm more and the shoulder feels more flexible. She reports improved ability to reach behind head and has a lower pain level with hair care. ROM measurements are improved slightly since last session. She is most painful and limited into abduction.    PT Next Visit Plan  continue manual with PROM, joint mobs, STM prn, potential dry needling, continue work on shoulder stretching, light stabilization/strengthening as tolerated, modalities prn    PT Home Exercise Plan  pendulums, wand flexion, ER AAROM, wall walks, table slide abd.wall clockstanding scapular stablizers , posterior capsular stretch       Patient will benefit from skilled therapeutic intervention in order to improve the following deficits and impairments:  Decreased activity tolerance, Decreased mobility, Decreased strength, Decreased range of motion, Postural dysfunction, Pain  Visit Diagnosis: Stiffness of left shoulder, not elsewhere classified  Chronic left shoulder pain  Muscle weakness (generalized)     Problem List Patient Active Problem List   Diagnosis Date Noted  . Muscle spasm of back 11/24/2013  . Dental caries 11/24/2013  . Visual changes 11/24/2013    Dorene Ar, PTA 09/07/2019, 9:12 AM  [facility] [address] [place], [place], [address] Phone: [phone]   Fax:  [phone]  Name: Elaine Burch MRN: [id number] Date of Birth: [birth year]

## 2019-09-18 ENCOUNTER — Encounter: Payer: Self-pay | Admitting: Physical Therapy

## 2019-09-18 ENCOUNTER — Other Ambulatory Visit: Payer: Self-pay

## 2019-09-18 ENCOUNTER — Ambulatory Visit: Payer: Self-pay | Admitting: Physical Therapy

## 2019-09-18 DIAGNOSIS — M25512 Pain in left shoulder: Secondary | ICD-10-CM

## 2019-09-18 DIAGNOSIS — M25612 Stiffness of left shoulder, not elsewhere classified: Secondary | ICD-10-CM

## 2019-09-18 DIAGNOSIS — M6281 Muscle weakness (generalized): Secondary | ICD-10-CM

## 2019-09-18 DIAGNOSIS — G8929 Other chronic pain: Secondary | ICD-10-CM

## 2019-09-18 NOTE — Therapy (Signed)
[place], [place], [address] Phone: [phone]   Fax:  [phone]  Physical Therapy Treatment  Patient Details  Name: Elaine Burch MRN: [id number] Date of Birth: 09/27/1966 Referring Provider (PT): Mcarthur Rossetti, MD   Encounter Date: 09/18/2019  PT End of Session - 09/18/19 0809    Visit Number  12    Number of Visits  17    Date for PT Re-Evaluation  10/04/19    Authorization Type  CAFA 100% until 12/23/19    PT Start Time  0804    PT Stop Time  0900    PT Time Calculation (min)  56 min    Activity Tolerance  Patient tolerated treatment well    Behavior During Therapy  [facility] for tasks assessed/performed       Past Medical History:  Diagnosis Date  . Known health problems: none     Past Surgical History:  Procedure Laterality Date  . NO PAST SURGERIES      There were no vitals filed for this visit.  Subjective Assessment - 09/18/19 0809    Subjective  I am doing better.  4/10 pain today.  I can put my hand behind my hair now    Patient is accompained by:  Interpreter   [id number]   Pertinent History  DM    Diagnostic tests  X-ray    Patient Stated Goals  brush her hair without pain    Currently in Pain?  Yes    Pain Score  4     Pain Location  Shoulder    Pain Orientation  Left    Pain Descriptors / Indicators  Aching    Pain Type  Chronic pain         [facility] PT Assessment - 09/18/19 0001      AROM   Left Shoulder Flexion  135 Degrees    Left Shoulder ABduction  --   126 scaption   Left Shoulder Internal Rotation  --   Reach t 10   Left Shoulder External Rotation  --   reach to T-1     PROM   Left Shoulder Flexion  151 Degrees    Left Shoulder ABduction  131 Degrees   pain   Left Shoulder Internal Rotation  80 Degrees    Left Shoulder External Rotation  52 Degrees      Strength   Right Shoulder Flexion  5/5    Right Shoulder ABduction  5/5    Right Shoulder Internal Rotation  5/5    Right Shoulder External Rotation  5/5    Left Shoulder Flexion  4/5    Left Shoulder ABduction  4/5    Left Shoulder Internal Rotation  4/5    Left Shoulder External Rotation  4/5                   [facility] Adult PT Treatment/Exercise - 09/18/19 0001      Self-Care   Self-Care  Other Self-Care Comments    Other Self-Care Comments   self myofascial release of sub scap on wall with tennis ball return demo      Shoulder Exercises: Supine   Other Supine Exercises  press up with LT ue with 4 lb weight 20 times      Shoulder Exercises: Standing   External Rotation  Both;Strengthening;10 reps    Theraband Level (Shoulder External Rotation)  Level 3 (Green)    External Rotation Limitations  Verbal  cues for retraction    Internal Rotation  20 reps    Theraband Level (Shoulder Internal Rotation)  Level 3 (Green)    Internal Rotation Limitations  VC and TC    Extension  20 reps    Theraband Level (Shoulder Extension)  Level 3 (Green)    Row  20 reps    Theraband Level (Shoulder Row)  Level 3 (Green)    Other Standing Exercises  wall clock with LT shoulder 2 x 10   empahsis on abduction with pillow case on LT UE     Shoulder Exercises: Stretch   Other Shoulder Stretches  pec corner stretch 3 x 20 sec      Moist Heat Therapy   Number Minutes Moist Heat  12 Minutes    Moist Heat Location  Shoulder      Manual Therapy   Manual Therapy  Soft tissue mobilization    Joint Mobilization  Inf and post GHJ mobs grade I-III    Soft tissue mobilization  subscapularis and posterior shoulder    Passive ROM  Left FLex, abd, horizontal add, IR and ER       Trigger Point Dry Needling - 09/18/19 0001    Consent Given?  Yes    Education Handout Provided  Previously provided    Muscles Treated Upper Quadrant  Subscapularis;Pectoralis minor;Pectoralis major;Biceps   LT only   Other Dry Needling  60 mm    Pectoralis Major Response  Palpable  increased muscle length    Pectoralis Minor Response  Palpable increased muscle length    Subscapularis Response  Twitch response elicited;Palpable increased muscle length    Latissimus dorsi Response  Twitch response elicited;Palpable increased muscle length                PT Long Term Goals - 09/18/19 0813      PT LONG TERM GOAL #1   Title  Pt will be I and compliant with HEP. (Target goal for all goals additional  6 weeks 10/04/19)    Baseline  continuing with scapular stabilizers and maximizing range without pain today 4/10    Time  6    Period  Weeks    Status  On-going    Target Date  10/04/19      PT LONG TERM GOAL #2   Title  Pt will improve Lt shoulder AROM to [facility] to improve mobility and function    Baseline  LT  AROM/PROM  flex 135/151, abd 126/131, IR To T 10/80, ER to T-1 /51    Time  6    Period  Weeks    Status  Partially Met    Target Date  10/04/19      PT LONG TERM GOAL #3   Title  Pt will improve Lt shoulder strength to at least 5-/5 MMT to improve function    Baseline  LT shld 4/5 improving    Time  6    Period  Weeks    Status  On-going    Target Date  10/04/19      PT LONG TERM GOAL #4   Title  Pt will decrease pain overall to less than 3/10 with usual activity and sleeping    Baseline  down to 4/10 today    Time  6    Period  Weeks    Status  On-going    Target Date  10/04/19      PT LONG TERM GOAL #5   Title  Pt will improve FOTO to less than 34% to show improved function.    Baseline  40% limited 9th visit    Time  6    Period  Weeks    Status  Unable to assess    Target Date  10/04/19            Plan - 09/18/19 1051    Clinical Impression Statement  Pt arrives tih 4/10 pain in LT shoulder and clearly better movement as shown on flowsheets and goals.  Pt with slight tenderness over pectoral, lats and LT subscapularis.  ER is most limied to 51 degrees passively but functionally able to  touch T-1. LTG AROM partially met today in  all planes but will continue to reinforce greater ER as able with HEP and Manual. Elaine Burch is making good progress towards completion of goals in two more visits    Personal Factors and Comorbidities  Comorbidity 1    Comorbidities  DM    Examination-Activity Limitations  Reach Overhead;Carry;Lift    Examination-Participation Restrictions  Cleaning;Meal Prep;Driving;Laundry    Stability/Clinical Decision Making  Evolving/Moderate complexity    Clinical Decision Making  Moderate    Rehab Potential  Good    PT Frequency  1x / week    PT Duration  6 weeks    PT Treatment/Interventions  ADLs/Self Care Home Management;Cryotherapy;Electrical Stimulation;Iontophoresis 12m/ml Dexamethasone;Moist Heat;Ultrasound;Therapeutic activities;Therapeutic exercise;Neuromuscular re-education;Manual techniques;Passive range of motion;Dry needling;Joint Manipulations;Taping    PT Next Visit Plan  2 more visits before DC  DO FOTO continue manual with PROM, joint mobs, STM prn, potential dry needling, continue work on shoulder stretching, light stabilization/strengthening as tolerated, modalities prn    PT Home Exercise Plan  pendulums, wand flexion, ER AAROM, wall walks, table slide abd.wall clockstanding scapular stablizers , posterior capsular stretch    Consulted and Agree with Plan of Care  Patient       Patient will benefit from skilled therapeutic intervention in order to improve the following deficits and impairments:  Decreased activity tolerance, Decreased mobility, Decreased strength, Decreased range of motion, Postural dysfunction, Pain  Visit Diagnosis: Stiffness of left shoulder, not elsewhere classified  Chronic left shoulder pain  Muscle weakness (generalized)     Problem List Patient Active Problem List   Diagnosis Date Noted  . Muscle spasm of back 11/24/2013  . Dental caries 11/24/2013  . Visual changes 11/24/2013    Elaine Burch PT Certified Exercise Expert for  the Aging Adult  09/18/19 10:59 AM Phone: [phone]: 3[facility] [address]Phone: [phone]  Fax:  [phone] Name: Elaine BlakleyMRN: [id number] of Birth: [birth year]

## 2019-09-25 ENCOUNTER — Ambulatory Visit: Payer: Self-pay | Admitting: Physical Therapy

## 2019-09-25 ENCOUNTER — Other Ambulatory Visit: Payer: Self-pay

## 2019-09-25 ENCOUNTER — Encounter: Payer: Self-pay | Admitting: Physical Therapy

## 2019-09-25 DIAGNOSIS — M6281 Muscle weakness (generalized): Secondary | ICD-10-CM

## 2019-09-25 DIAGNOSIS — G8929 Other chronic pain: Secondary | ICD-10-CM

## 2019-09-25 DIAGNOSIS — M25512 Pain in left shoulder: Secondary | ICD-10-CM

## 2019-09-25 DIAGNOSIS — M25612 Stiffness of left shoulder, not elsewhere classified: Secondary | ICD-10-CM

## 2019-09-25 NOTE — Therapy (Signed)
[place], [place], [address] Phone: [phone]   Fax:  [phone]  Physical Therapy Treatment  Patient Details  Name: Elaine Burch MRN: [id number] Date of Birth: [birth year] Referring Provider (PT): Mcarthur Rossetti, MD   Encounter Date: 09/25/2019  PT End of Session - 09/25/19 0758    Visit Number  13    Number of Visits  17    Date for PT Re-Evaluation  10/04/19    Authorization Type  CAFA 100% until 12/23/19    PT Start Time  7096    PT Stop Time  0855    PT Time Calculation (min)  58 min       Past Medical History:  Diagnosis Date  . Known health problems: none     Past Surgical History:  Procedure Laterality Date  . NO PAST SURGERIES      There were no vitals filed for this visit.  Subjective Assessment - 09/25/19 0801    Subjective  I could not do the exercise with the ball because it hurt my shoulder.    Currently in Pain?  Yes    Pain Score  4     Pain Location  Shoulder    Pain Orientation  Left    Pain Descriptors / Indicators  Aching    Aggravating Factors   reaching really high    Pain Relieving Factors  rest         [facility] PT Assessment - 09/25/19 0001      Observation/Other Assessments   Focus on Therapeutic Outcomes (FOTO)   46% limited       AROM   Left Shoulder Internal Rotation  85 Degrees    Left Shoulder External Rotation  50 Degrees                   [facility] Adult PT Treatment/Exercise - 09/25/19 0001      Self-Care   Other Self-Care Comments   self myofascial release of sub scap- sitting with elbow relaxed on table       Shoulder Exercises: Supine   Other Supine Exercises  press up with LT ue with 4 lb weight 20 times      Shoulder Exercises: Sidelying   External Rotation  20 reps    External Rotation Weight (lbs)  2    ABduction  20 reps    ABduction Weight (lbs)  2      Shoulder Exercises: Standing   External Rotation   Both;Strengthening;10 reps    Theraband Level (Shoulder External Rotation)  Level 3 (Green)    Internal Rotation  20 reps    Theraband Level (Shoulder Internal Rotation)  Level 3 (Green)    Extension  20 reps    Theraband Level (Shoulder Extension)  Level 3 (Green)    Row  20 reps    Theraband Level (Shoulder Row)  Level 3 (Green)      Shoulder Exercises: Pulleys   Flexion  2 minutes    Scaption  1 minute      Shoulder Exercises: ROM/Strengthening   UBE (Upper Arm Bike)  L1 3 min forward, 3 min backward    Other ROM/Strengthening Exercises  wall slides flexion and scaption with pillow case    1# on wrist wall wash circles x 10 each direction     Shoulder Exercises: Stretch   Other Shoulder Stretches  pec corner stretch 3 x 20 sec      Moist  Heat Therapy   Number Minutes Moist Heat  10 Minutes    Moist Heat Location  Shoulder      Manual Therapy   Joint Mobilization  Inf and post GHJ mobs grade I-III    Passive ROM  Left FLex, abd, horizontal add, IR and ER                  PT Long Term Goals - 09/18/19 0813      PT LONG TERM GOAL #1   Title  Pt will be I and compliant with HEP. (Target goal for all goals additional  6 weeks 10/04/19)    Baseline  continuing with scapular stabilizers and maximizing range without pain today 4/10    Time  6    Period  Weeks    Status  On-going    Target Date  10/04/19      PT LONG TERM GOAL #2   Title  Pt will improve Lt shoulder AROM to [facility] to improve mobility and function    Baseline  LT  AROM/PROM  flex 135/151, abd 126/131, IR To T 10/80, ER to T-1 /51    Time  6    Period  Weeks    Status  Partially Met    Target Date  10/04/19      PT LONG TERM GOAL #3   Title  Pt will improve Lt shoulder strength to at least 5-/5 MMT to improve function    Baseline  LT shld 4/5 improving    Time  6    Period  Weeks    Status  On-going    Target Date  10/04/19      PT LONG TERM GOAL #4   Title  Pt will decrease pain overall to  less than 3/10 with usual activity and sleeping    Baseline  down to 4/10 today    Time  6    Period  Weeks    Status  On-going    Target Date  10/04/19      PT LONG TERM GOAL #5   Title  Pt will improve FOTO to less than 34% to show improved function.    Baseline  40% limited 9th visit    Time  6    Period  Weeks    Status  Unable to assess    Target Date  10/04/19            Plan - 09/25/19 0816    Clinical Impression Statement  Pt arrives reporting difficulty with Tennis ball release for sub scap. Reviewed this and also instructed her in another option. She reports pain mostly with end range reaching rated at 4/10. FOTO score 46% limited which is actually more limited than her last status. Continued with ROM and strengthening and she tolerated well with some increased pain.    PT Next Visit Plan  1 more visits before DC;  FOTO completed 09/25/19;  continue manual with PROM, joint mobs, STM prn, potential dry needling, continue work on shoulder stretching, light stabilization/strengthening as tolerated, modalities prn       Patient will benefit from skilled therapeutic intervention in order to improve the following deficits and impairments:  Decreased activity tolerance, Decreased mobility, Decreased strength, Decreased range of motion, Postural dysfunction, Pain  Visit Diagnosis: Stiffness of left shoulder, not elsewhere classified  Chronic left shoulder pain  Muscle weakness (generalized)     Problem List Patient Active Problem List   Diagnosis Date Noted  .  Muscle spasm of back 11/24/2013  . Dental caries 11/24/2013  . Visual changes 11/24/2013    Dorene Ar, PTA 09/25/2019, 9:05 AM  [place], [place], [address] Phone: [phone]   Fax:  [phone]  Name: Elaine Burch MRN: [id number] Date of Birth: 09/18/1966

## 2019-10-01 ENCOUNTER — Other Ambulatory Visit: Payer: Self-pay

## 2019-10-01 ENCOUNTER — Encounter: Payer: Self-pay | Admitting: Orthopaedic Surgery

## 2019-10-01 ENCOUNTER — Ambulatory Visit (INDEPENDENT_AMBULATORY_CARE_PROVIDER_SITE_OTHER): Payer: Self-pay | Admitting: Orthopaedic Surgery

## 2019-10-01 DIAGNOSIS — M25512 Pain in left shoulder: Secondary | ICD-10-CM

## 2019-10-01 DIAGNOSIS — M7542 Impingement syndrome of left shoulder: Secondary | ICD-10-CM

## 2019-10-01 DIAGNOSIS — G8929 Other chronic pain: Secondary | ICD-10-CM

## 2019-10-01 NOTE — Progress Notes (Signed)
The patient is seen in follow-up today with an interpreter.  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  She has had severe tendinitis and impingement syndrome.  She has had a subscapular steroid injection and more recently an intra-articular steroid injection into the left shoulder.  Between that and physical therapy she is making progress with improving her shoulder motion and strength.  She is certainly about [id number] better she reports from her last visit.  On examination today she does have improved shoulder abduction as well as forward flexion on the left side.  Her internal rotation with adduction is also improved as has her external rotation.  She will continue to push herself through therapy and with time hopefully will continue to have a good outcome.  I do not feel surgical intervention is warranted at this point given that she is making progress.  We would like to see her back in 6 weeks to consider repeat injections only if needed.  All questions and concerns were answered and addressed to the interpreter.

## 2019-10-02 ENCOUNTER — Ambulatory Visit: Payer: Self-pay | Admitting: Physical Therapy

## 2019-10-02 ENCOUNTER — Encounter: Payer: Self-pay | Admitting: Physical Therapy

## 2019-10-02 DIAGNOSIS — M25612 Stiffness of left shoulder, not elsewhere classified: Secondary | ICD-10-CM

## 2019-10-02 DIAGNOSIS — M6281 Muscle weakness (generalized): Secondary | ICD-10-CM

## 2019-10-02 DIAGNOSIS — G8929 Other chronic pain: Secondary | ICD-10-CM

## 2019-10-02 DIAGNOSIS — M25512 Pain in left shoulder: Secondary | ICD-10-CM

## 2019-10-02 NOTE — Patient Instructions (Addendum)
         Voncille Lo, PT Certified Exercise Expert for the Aging Adult  10/02/19 8:15 AM Phone: [phone] Fax: [phone]

## 2019-10-02 NOTE — Therapy (Signed)
[place], [place], [address] Phone: [phone]   Fax:  [phone]  Physical Therapy Treatment/Discharge Note  Patient Details  Name: Elaine Burch MRN: [id number] Date of Birth: November 14, 1965 Referring Provider (PT): Mcarthur Rossetti, MD   Encounter Date: 10/02/2019  PT End of Session - 10/02/19 0838    Visit Number  14    Number of Visits  17    Date for PT Re-Evaluation  10/04/19    Authorization Type  CAFA 100% until 12/23/19    PT Start Time  0801    PT Stop Time  0855    PT Time Calculation (min)  54 min    Activity Tolerance  Patient tolerated treatment well    Behavior During Therapy  [facility] for tasks assessed/performed       Past Medical History:  Diagnosis Date  . Known health problems: none     Past Surgical History:  Procedure Laterality Date  . NO PAST SURGERIES      There were no vitals filed for this visit.  Subjective Assessment - 10/02/19 0808    Subjective  I saw my doctor. I am doing well and will see him in 6 weeks.    Patient is accompained by:  Interpreter   Mirtha with Bethanie Dicker [phone]   Pertinent History  DM    Diagnostic tests  X-ray    Patient Stated Goals  brush her hair without pain    Currently in Pain?  Yes    Pain Score  5    initiaition  of RX but reduces to 3/10   Pain Location  Shoulder    Pain Orientation  Left    Pain Descriptors / Indicators  Aching    Pain Type  Chronic pain         [facility] PT Assessment - 10/02/19 0001      Assessment   Medical Diagnosis  Lt shoulder pain    Referring Provider (PT)  Mcarthur Rossetti, MD      Observation/Other Assessments   Focus on Therapeutic Outcomes (FOTO)   29% limited       AROM   Left Shoulder Flexion  155 Degrees    Left Shoulder ABduction  133 Degrees    Left Shoulder Internal Rotation  85 Degrees    Left Shoulder External Rotation  70 Degrees      PROM   Left Shoulder Flexion   160 Degrees    Left Shoulder ABduction  145 Degrees    Left Shoulder Internal Rotation  85 Degrees    Left Shoulder External Rotation  75 Degrees      Strength   Right Shoulder Flexion  5/5    Right Shoulder ABduction  5/5    Right Shoulder Internal Rotation  5/5    Right Shoulder External Rotation  5/5    Left Shoulder Flexion  5/5    Left Shoulder ABduction  5/5    Left Shoulder Internal Rotation  --   5-/5   Left Shoulder External Rotation  4+/5                   [facility] Adult PT Treatment/Exercise - 10/02/19 0001      Self-Care   Self-Care  Other Self-Care Comments    Other Self-Care Comments   reinforced self myofascial release with tennis ball on shoulder on wall      Shoulder Exercises: Supine   Other Supine Exercises  press  up with LT ue with 4 lb weight 20 times      Shoulder Exercises: Sidelying   External Rotation  20 reps    External Rotation Weight (lbs)  2    ABduction  20 reps    ABduction Weight (lbs)  2      Shoulder Exercises: Standing   External Rotation  Both;Strengthening;10 reps;20 reps    Theraband Level (Shoulder External Rotation)  Level 4 (Blue)    Internal Rotation  20 reps;Strengthening;Left    Theraband Level (Shoulder Internal Rotation)  Level 4 (Blue)    Extension  20 reps;Strengthening;Both;Theraband    Theraband Level (Shoulder Extension)  Level 4 (Blue)    Row  Strengthening;Both;20 reps    Theraband Level (Shoulder Row)  Level 4 (Blue)    Other Standing Exercises  wall clock      Shoulder Exercises: Stretch   Other Shoulder Stretches  pec corner stretch 3 x 20 sec      Moist Heat Therapy   Number Minutes Moist Heat  12 Minutes    Moist Heat Location  Shoulder             PT Education - 10/02/19 0833    Education Details  reviewed HEP and added progression for home HEP          PT Long Term Goals - 10/02/19 0846      PT LONG TERM GOAL #1   Title  Pt will be I and compliant with HEP. (Target goal for all  goals additional  6 weeks 10/04/19)    Baseline  PT demo HEP for last visit    Time  6    Period  Weeks    Status  Achieved      PT LONG TERM GOAL #2   Title  Pt will improve Lt shoulder AROM to [facility] to improve mobility and function    Baseline  LT  AROM/PROM  flex 155/160, abd 133/145, IR To 55/77, ER to 50/75    Time  6    Period  Weeks    Status  Achieved      PT LONG TERM GOAL #3   Title  Pt will improve Lt shoulder strength to at least 5-/5 MMT to improve function    Baseline  LT shld 4+/5 improving  all else 5/5    Time  6    Period  Weeks    Status  Partially Met      PT LONG TERM GOAL #4   Title  Pt will decrease pain overall to less than 3/10 with usual activity and sleeping    Baseline  initially 5/10  today improved with exercises to 3/10    Time  6    Period  Weeks    Status  Partially Met      PT LONG TERM GOAL #5   Title  Pt will improve FOTO to less than 34% to show improved function.    Baseline  29% on 14th visit    Time  6    Status  Achieved            Plan - 10/02/19 1359    Clinical Impression Statement  Pt arrives with 5/10 pain but does improve with exericise to 3/10.  reviewed HEP and has increased AROM to [facility] and returns demo for HEP. Pt with 5/5 MMT except for ER LT to 4+/5   Pt reports pain at end range but has at least 150  to 160 AROM flex.  FOTO limitation improved to 29%. Pt has achieved or partially met all goals and is able to retrun demo for all exericises.  Pt will be discharged today to HEP    Personal Factors and Comorbidities  Comorbidity 1    Comorbidities  DM    Examination-Activity Limitations  Reach Overhead;Carry;Lift    Examination-Participation Restrictions  Cleaning;Meal Prep;Driving;Laundry    Stability/Clinical Decision Making  Evolving/Moderate complexity    Clinical Decision Making  Moderate    Rehab Potential  Good    PT Frequency  1x / week    PT Duration  6 weeks    PT Treatment/Interventions  ADLs/Self Care Home  Management;Cryotherapy;Electrical Stimulation;Iontophoresis 41m/ml Dexamethasone;Moist Heat;Ultrasound;Therapeutic activities;Therapeutic exercise;Neuromuscular re-education;Manual techniques;Passive range of motion;Dry needling;Joint Manipulations;Taping    PT Next Visit Plan  DC    PT Home Exercise Plan  pendulums, wand flexion, ER AAROM, wall walks, table slide abd.wall clockstanding scapular stablizers , posterior capsular stretch    Consulted and Agree with Plan of Care  Patient       Patient will benefit from skilled therapeutic intervention in order to improve the following deficits and impairments:  Decreased activity tolerance, Decreased mobility, Decreased strength, Decreased range of motion, Postural dysfunction, Pain  Visit Diagnosis: Stiffness of left shoulder, not elsewhere classified  Chronic left shoulder pain  Muscle weakness (generalized)     Problem List Patient Active Problem List   Diagnosis Date Noted  . Muscle spasm of back 11/24/2013  . Dental caries 11/24/2013  . Visual changes 11/24/2013    LVoncille Lo PT Certified Exercise Expert for the Aging Adult  10/02/19 2:02 PM Phone: [phone]: 3[facility]GD[address] [place] [address]Phone: [phone]  Fax:  [phone] Name: AVidel NobregaMRN: [id number] of Birth: 309/17/1967  PHYSICAL THERAPY DISCHARGE SUMMARY  Visits from Start of Care: 14  Current functional level related to goals / functional outcomes: As above   Remaining deficits: ER strength  4+/5 but all else 5/5 or 5-/5   Education / Equipment: HEP Plan: Patient agrees to discharge.  Patient goals were partially met. Patient is being discharged due to being pleased with the current functional level.  ?????    And completing rehab goals and being independent with HEP  LVoncille Lo PT Certified Exercise Expert for the  Aging Adult  10/02/19 2:02 PM Phone: [phone]: [phone]

## 2019-10-07 ENCOUNTER — Other Ambulatory Visit: Payer: Self-pay

## 2019-10-07 ENCOUNTER — Encounter (HOSPITAL_COMMUNITY): Payer: Self-pay

## 2019-10-07 ENCOUNTER — Emergency Department (HOSPITAL_COMMUNITY): Payer: Self-pay

## 2019-10-07 ENCOUNTER — Emergency Department (HOSPITAL_COMMUNITY)
Admission: EM | Admit: 2019-10-07 | Discharge: 2019-10-07 | Disposition: A | Discharge status: Home / Self Care | Payer: Self-pay | Attending: Emergency Medicine | Admitting: Emergency Medicine

## 2019-10-07 DIAGNOSIS — R112 Nausea with vomiting, unspecified: Secondary | ICD-10-CM | POA: Insufficient documentation

## 2019-10-07 DIAGNOSIS — G43909 Migraine, unspecified, not intractable, without status migrainosus: Secondary | ICD-10-CM | POA: Insufficient documentation

## 2019-10-07 DIAGNOSIS — E119 Type 2 diabetes mellitus without complications: Secondary | ICD-10-CM | POA: Insufficient documentation

## 2019-10-07 DIAGNOSIS — R29810 Facial weakness: Secondary | ICD-10-CM | POA: Insufficient documentation

## 2019-10-07 DIAGNOSIS — R42 Dizziness and giddiness: Secondary | ICD-10-CM | POA: Insufficient documentation

## 2019-10-07 DIAGNOSIS — R519 Headache, unspecified: Secondary | ICD-10-CM

## 2019-10-07 DIAGNOSIS — Z7984 Long term (current) use of oral hypoglycemic drugs: Secondary | ICD-10-CM | POA: Insufficient documentation

## 2019-10-07 HISTORY — DX: Type 2 diabetes mellitus without complications: E11.9

## 2019-10-07 LAB — PROTIME-INR
INR: 1 (ref 0.8–1.2)
Prothrombin Time: 13.1 seconds (ref 11.4–15.2)

## 2019-10-07 LAB — COMPREHENSIVE METABOLIC PANEL
ALT: 36 U/L (ref 0–44)
AST: 31 U/L (ref 15–41)
Albumin: 4.3 g/dL (ref 3.5–5.0)
Alkaline Phosphatase: 79 U/L (ref 38–126)
Anion gap: 13 (ref 5–15)
BUN: 12 mg/dL (ref 6–20)
CO2: 23 mmol/L (ref 22–32)
Calcium: 9.4 mg/dL (ref 8.9–10.3)
Chloride: 101 mmol/L (ref 98–111)
Creatinine, Ser: 0.57 mg/dL (ref 0.44–1.00)
GFR calc Af Amer: >60 mL/min (ref >60)
GFR calc non Af Amer: >60 mL/min (ref >60)
Glucose, Bld: 185 mg/dL — ABNORMAL HIGH (ref 70–99)
Potassium: 3.1 mmol/L — ABNORMAL LOW (ref 3.5–5.1)
Sodium: 137 mmol/L (ref 135–145)
Total Bilirubin: 0.5 mg/dL (ref 0.3–1.2)
Total Protein: 7.6 g/dL (ref 6.5–8.1)

## 2019-10-07 LAB — CBC
HCT: 45.3 % (ref 36.0–46.0)
Hemoglobin: 14.6 g/dL (ref 12.0–15.0)
MCH: 28.8 pg (ref 26.0–34.0)
MCHC: 32.2 g/dL (ref 30.0–36.0)
MCV: 89.3 fL (ref 80.0–100.0)
Platelets: 206 10*3/uL (ref 150–400)
RBC: 5.07 MIL/uL (ref 3.87–5.11)
RDW: 19.9 % — ABNORMAL HIGH (ref 11.5–15.5)
WBC: 6.8 10*3/uL (ref 4.0–10.5)
nRBC: 0 % (ref 0.0–0.2)

## 2019-10-07 LAB — I-STAT CHEM 8, ED
BUN: 13 mg/dL (ref 6–20)
Calcium, Ion: 1.19 mmol/L (ref 1.15–1.40)
Chloride: 102 mmol/L (ref 98–111)
Creatinine, Ser: 0.4 mg/dL — ABNORMAL LOW (ref 0.44–1.00)
Glucose, Bld: 181 mg/dL — ABNORMAL HIGH (ref 70–99)
HCT: 45 % (ref 36.0–46.0)
Hemoglobin: 15.3 g/dL — ABNORMAL HIGH (ref 12.0–15.0)
Potassium: 3.1 mmol/L — ABNORMAL LOW (ref 3.5–5.1)
Sodium: 139 mmol/L (ref 135–145)
TCO2: 26 mmol/L (ref 22–32)

## 2019-10-07 LAB — DIFFERENTIAL
Abs Immature Granulocytes: 0.02 10*3/uL (ref 0.00–0.07)
Basophils Absolute: 0.1 10*3/uL (ref 0.0–0.1)
Basophils Relative: 1 %
Eosinophils Absolute: 0.2 10*3/uL (ref 0.0–0.5)
Eosinophils Relative: 2 %
Immature Granulocytes: 0 %
Lymphocytes Relative: 58 %
Lymphs Abs: 3.9 10*3/uL (ref 0.7–4.0)
Monocytes Absolute: 0.4 10*3/uL (ref 0.1–1.0)
Monocytes Relative: 5 %
Neutro Abs: 2.3 10*3/uL (ref 1.7–7.7)
Neutrophils Relative %: 34 %

## 2019-10-07 LAB — I-STAT BETA HCG BLOOD, ED (MC, WL, AP ONLY): I-stat hCG, quantitative: <5 m[IU]/mL (ref <5)

## 2019-10-07 LAB — APTT: aPTT: 25 seconds (ref 24–36)

## 2019-10-07 LAB — CBG MONITORING, ED: Glucose-Capillary: 152 mg/dL — ABNORMAL HIGH (ref 70–99)

## 2019-10-07 MED ORDER — DIPHENHYDRAMINE HCL 50 MG/ML IJ SOLN
25.0000 mg | Freq: Once | INTRAMUSCULAR | Status: AC
  Administered 2019-10-07: 25 mg via INTRAVENOUS
  Filled 2019-10-07: qty 1

## 2019-10-07 MED ORDER — SODIUM CHLORIDE 0.9% FLUSH: 3.0000 mL | Freq: Once | INTRAVENOUS | Status: DC

## 2019-10-07 MED ORDER — SODIUM CHLORIDE 0.9 % IV BOLUS
1000.0000 mL | Freq: Once | INTRAVENOUS | Status: AC
  Administered 2019-10-07: 1000 mL via INTRAVENOUS

## 2019-10-07 MED ORDER — METOCLOPRAMIDE HCL 5 MG/ML IJ SOLN
10.0000 mg | Freq: Once | INTRAMUSCULAR | Status: AC
  Administered 2019-10-07: 10 mg via INTRAVENOUS
  Filled 2019-10-07: qty 2

## 2019-10-07 MED ORDER — PROMETHAZINE HCL 25 MG/ML IJ SOLN
25.0000 mg | Freq: Once | INTRAMUSCULAR | Status: AC
  Administered 2019-10-07: 02:00:00 25 mg via INTRAVENOUS
  Filled 2019-10-07: qty 1

## 2019-10-07 MED ORDER — KETOROLAC TROMETHAMINE 30 MG/ML IJ SOLN
30.0000 mg | Freq: Once | INTRAMUSCULAR | Status: AC
  Administered 2019-10-07: 30 mg via INTRAVENOUS
  Filled 2019-10-07: qty 1

## 2019-10-07 MED ORDER — IOHEXOL 350 MG/ML SOLN
75.0000 mL | Freq: Once | INTRAVENOUS | Status: AC | PRN
  Administered 2019-10-07: 75 mL via INTRAVENOUS

## 2019-10-07 NOTE — ED Notes (Signed)
The pt just returned from [facility]

## 2019-10-07 NOTE — ED Triage Notes (Signed)
Pt states that she went to sleep around 1030 and was fine, woke up with the worst headache of her life, some dizziness and nausea, vomited once upon arrival, small L sided facial droop.

## 2019-10-07 NOTE — ED Notes (Signed)
To c-t again

## 2019-10-07 NOTE — Consult Note (Addendum)
NEURO HOSPITALIST CONSULT NOTE   Requestig physician: Dr. Betsey Holiday  Reason for Consult: Severe headache with dizziness, nausea and vomiting. Left facial droop seen by EDP.   History obtained from:   Patient and Chart     HPI:                                                                                                                                          Elaine Burch is an 54 y.o. female presenting to the ED after waking up with the worst headache of her life, along with some dizziness and nausea. She vomited once on arrival and EDP noted left facial droop. LKN was 1030 when she went to sleep. BP 137/80 in the ED.   In CT, she rated her headache as 9/10, originating in her occipital region, radiating to the top and front of her head, as well as along the left side of her head and down her neck posterolaterally.   Past Medical History:  Diagnosis Date  . Diabetes mellitus without complication ([place])   . Known health problems: none     Past Surgical History:  Procedure Laterality Date  . NO PAST SURGERIES      Family History  Problem Relation Age of Onset  . Diabetes Mother   . Cancer Father               Social History:  reports that she has never smoked. She has never used smokeless tobacco. She reports that she does not drink alcohol or use drugs.  No Known Allergies  MEDICATIONS:                                                                                                                      No current facility-administered medications on file prior to encounter.   Current Outpatient Medications on File Prior to Encounter  Medication Sig Dispense Refill  . atorvastatin (LIPITOR) 20 MG tablet Take 1 tablet (20 mg total) by mouth daily. To lower cholesterol 30 tablet 11  . Blood Glucose Monitoring Suppl (TRUE METRIX METER) w/Device KIT Use to check blood sugars 2-3 times per day 1 kit 0  . cyclobenzaprine (FLEXERIL) 5 MG tablet Take 1  tablet (5 mg total) by mouth 3 (three) times  daily as needed for muscle spasms. 60 tablet 3  . famotidine (PEPCID) 20 MG tablet Take 1 tablet (20 mg total) by mouth 2 (two) times daily. To protect stomach 60 tablet 11  . ferrous sulfate 325 (65 FE) MG tablet Take 1 tablet (325 mg total) by mouth daily with breakfast. 30 tablet 3  . glucose blood (TRUE METRIX BLOOD GLUCOSE TEST) test strip Use as instructed to check blood sugar 2-3 times 100 each 12  . ibuprofen (ADVIL) 600 MG tablet Take 1 tablet (600 mg total) by mouth every 8 (eight) hours as needed for moderate pain. Take after eating 60 tablet 2  . metFORMIN (GLUCOPHAGE) 500 MG tablet Take 1 tablet (500 mg total) by mouth 2 (two) times daily with a meal. For 1 week then increase to 2 pills twice daily after meal 120 tablet 3  . polyethylene glycol powder (GLYCOLAX/MIRALAX) 17 GM/SCOOP powder Use 17 gm scoop once per day mixed with fluid as needed to help with constipation 578 g 11  . traMADol (ULTRAM) 50 MG tablet Take 1-2 tablets (50-100 mg total) by mouth every 6 (six) hours as needed. 20 tablet 0  . TRUEplus Lancets 28G MISC Use when checking blood sugars 100 each 11    ROS:                                                                                                                                       Denies fever and neck stiffness. Other symptoms as per HPI with comprehensive ROS otherwise negative.   Blood pressure 137/80, pulse 88, resp. rate 18, SpO2 97 %.   General Examination:                                                                                                       Physical Exam  HEENT-  /AT  Lungs- Respirations unlabored Extremities- No edema  Neurological Examination Mental Status: Awake and alert. Dysthymic affect. Speech fluent without evidence of aphasia.  Able to follow all commands in Spanish without difficulty. Cranial Nerves: II: Visual fields grossly intact with no extinction to DSS. PERRL.   III,IV, VI: ptosis not present. EOMI. No nystagmus.  V,VII: Smile symmetric with no facial droop noted. Facial temp sensation equal bilaterally VIII: hearing intact to voice.  IX,X: Palate rises symmetrically XI: Symmetric XII: midline tongue extension Motor: Right : Upper extremity   5/5    Left:     Upper extremity   5/5  Lower  extremity   5/5     Lower extremity   5/5 No pronator drift.  Sensory: Temp and light touch intact throughout, bilaterally Deep Tendon Reflexes: 2+ and symmetric throughout Plantars: Right: downgoing   Left: downgoing Cerebellar: No ataxia with FNF bilaterally. Slow, laborious movements with effortful expression noted.  Gait: Deferred   Lab Results: Basic Metabolic Panel: Recent Labs  Lab 10/07/19 0109 10/07/19 0116  NA 137 139  K 3.1* 3.1*  CL 101 102  CO2 23  --   GLUCOSE 185* 181*  BUN 12 13  CREATININE 0.57 0.40*  CALCIUM 9.4  --     CBC: Recent Labs  Lab 10/07/19 0109 10/07/19 0116  WBC 6.8  --   NEUTROABS 2.3  --   HGB 14.6 15.3*  HCT 45.3 45.0  MCV 89.3  --   PLT 206  --     Cardiac Enzymes: No results for input(s): CKTOTAL, CKMB, CKMBINDEX, TROPONINI in the last 168 hours.  Lipid Panel: No results for input(s): CHOL, TRIG, HDL, CHOLHDL, VLDL, LDLCALC in the last 168 hours.  Imaging: CT HEAD CODE STROKE WO CONTRAST  Result Date: 10/07/2019 CLINICAL DATA:  Code stroke. Initial evaluation for acute headache, left facial droop. EXAM: CT HEAD WITHOUT CONTRAST TECHNIQUE: Contiguous axial images were obtained from the base of the skull through the vertex without intravenous contrast. COMPARISON:  None. FINDINGS: Brain: Cerebral volume within normal limits for patient age. No evidence for acute intracranial hemorrhage. No findings to suggest acute large vessel territory infarct. No mass lesion, midline shift, or mass effect. Ventricles are normal in size without evidence for hydrocephalus. No extra-axial fluid collection identified.  Vascular: No hyperdense vessel identified. Skull: Scalp soft tissues demonstrate no acute abnormality. Calvarium intact. Sinuses/Orbits: Globes and orbital soft tissues within normal limits. Visualized paranasal sinuses are clear. No mastoid effusion. ASPECTS [facility] Stroke Program Early CT Score) - Ganglionic level infarction (caudate, lentiform nuclei, internal capsule, insula, M1-M3 cortex): 7 - Supraganglionic infarction (M4-M6 cortex): 3 Total score (0-10 with 10 being normal): 10 IMPRESSION: 1. Negative head CT.  No acute intracranial abnormality. 2. ASPECTS is 10. These results were communicated to Dr. Cheral Marker at [place] 1/3/2021by text page via the [facility] messaging system. Electronically Signed   By: Jeannine Boga M.D.   On: 10/07/2019 01:37    Assessment: 54 year old female with acute onset of 9/10 headache, dizziness and nausea with vomiting.  1. Left sided facial droop was seen by EDP. Code Stroke was called.  2. CT head negative.  3. Neurological exam following CT head is nonfocal. Continues to have headache.  4. Has no history of migraines, but symptom complex combined with nonfocal exam and normal CT head militates in favor of complicated migraine. Lower on the DDx but also possible would be aneurysmal rupture with small subarachnoid bleed not detectable on CT.   Recommendations: 1. MRI brain   2. CTA of head and neck to r/o dissection and aneurysm 3. Phenergan 25 mg IV x 1 (ordered) 4. Bolus 1 L NS   Electronically signed: Dr. Kerney Elbe 10/07/2019, 1:44 AM

## 2019-10-07 NOTE — ED Notes (Signed)
Elaine Burch [id number] husband looking for an update, only speaks spanish

## 2019-10-07 NOTE — ED Notes (Signed)
Pt returned from c-t going to mri in a few minutesjeadache  Gone  Sleeping from meds given

## 2019-10-07 NOTE — Code Documentation (Signed)
Responded to Code Stroke called at 0111 on pt already in ED. Pt arrived with headache, dizziness, and L facial droop. Pt assessed in CT, facial droop no longer present, NIH-0, CT negative. Code Stroke cancelled at [place]. Plan to tx for complicated migraine.

## 2019-10-07 NOTE — Discharge Instructions (Signed)
All images today including CT's and MRI were normal-- no evidence of stroke, aneurysm, or other acute findings. Recommend that you follow-up with your primary care doctor. I have also attached information for neurology office if needed for ongoing headaches-- call for appt. Return here for any new/acute changes.

## 2019-10-07 NOTE — ED Notes (Signed)
Cancelled code stroke 

## 2019-10-07 NOTE — ED Provider Notes (Signed)
[facility] EMERGENCY DEPARTMENT Provider Note   CSN: [id number] Arrival date & time: 10/07/19  0051     History Chief Complaint  Patient presents with  . Code Stroke    Elaine Burch is a 54 y.o. female.  The history is provided by the patient and medical records.    54 year old female with history of diabetes, migraine headaches, presenting to the ED with headache.  Patient reportedly went to bed around 10:30 PM and felt okay, woke up about 30 minutes prior to arrival with headache from the front of her head radiating to the back of her neck, dizziness, nausea, and vomiting.  She was made a code stroke in triage and transported to trauma a after scan.  She reports history of headaches in the past but never this severe.  She has not had any fever.  She denies any neck stiffness.  She is not had any medications prior to arrival.  No known allergies.  She is not on anticoagulation.  Past Medical History:  Diagnosis Date  . Diabetes mellitus without complication ([place])   . Known health problems: none     Patient Active Problem List   Diagnosis Date Noted  . Muscle spasm of back 11/24/2013  . Dental caries 11/24/2013  . Visual changes 11/24/2013    Past Surgical History:  Procedure Laterality Date  . NO PAST SURGERIES       OB History    Gravida  4   Para  0   Term  0   Preterm  0   AB  0   Living  4     SAB  0   TAB  0   Ectopic  0   Multiple  0   Live Births  0           Family History  Problem Relation Age of Onset  . Diabetes Mother   . Cancer Father     Social History   Tobacco Use  . Smoking status: Never Smoker  . Smokeless tobacco: Never Used  Substance Use Topics  . Alcohol use: No  . Drug use: No    Home Medications Prior to Admission medications   Medication Sig Start Date End Date Taking? Authorizing Provider  atorvastatin (LIPITOR) 20 MG tablet Take 1 tablet (20 mg total) by mouth daily. To lower  cholesterol 08/29/19   Fulp, Cammie, MD  Blood Glucose Monitoring Suppl (TRUE METRIX METER) w/Device KIT Use to check blood sugars 2-3 times per day 08/09/19   Fulp, Cammie, MD  cyclobenzaprine (FLEXERIL) 5 MG tablet Take 1 tablet (5 mg total) by mouth 3 (three) times daily as needed for muscle spasms. 06/27/19   Fulp, Cammie, MD  famotidine (PEPCID) 20 MG tablet Take 1 tablet (20 mg total) by mouth 2 (two) times daily. To protect stomach 06/27/19   Fulp, Cammie, MD  ferrous sulfate 325 (65 FE) MG tablet Take 1 tablet (325 mg total) by mouth daily with breakfast. 06/27/19   Fulp, Cammie, MD  glucose blood (TRUE METRIX BLOOD GLUCOSE TEST) test strip Use as instructed to check blood sugar 2-3 times 08/09/19   Fulp, Cammie, MD  ibuprofen (ADVIL) 600 MG tablet Take 1 tablet (600 mg total) by mouth every 8 (eight) hours as needed for moderate pain. Take after eating 06/27/19   Fulp, Cammie, MD  metFORMIN (GLUCOPHAGE) 500 MG tablet Take 1 tablet (500 mg total) by mouth 2 (two) times daily with a meal. For 1  week then increase to 2 pills twice daily after meal 07/14/19   Fulp, Cammie, MD  polyethylene glycol powder (GLYCOLAX/MIRALAX) 17 GM/SCOOP powder Use 17 gm scoop once per day mixed with fluid as needed to help with constipation 08/09/19   Fulp, Cammie, MD  traMADol (ULTRAM) 50 MG tablet Take 1-2 tablets (50-100 mg total) by mouth every 6 (six) hours as needed. 09/05/19   Hilts, Legrand Como, MD  TRUEplus Lancets 28G MISC Use when checking blood sugars 08/09/19   Fulp, Ander Gaster, MD    Allergies    Patient has no known allergies.  Review of Systems   Review of Systems  Gastrointestinal: Positive for nausea and vomiting.  Neurological: Positive for headaches.  All other systems reviewed and are negative.   Physical Exam Updated Vital Signs BP (!) 104/53   Pulse 79   Resp 18   SpO2 95%   Physical Exam Vitals and nursing note reviewed.  Constitutional:      Appearance: She is well-developed.     Comments:  Vomiting, appears uncomfortable  HENT:     Head: Normocephalic and atraumatic.  Eyes:     Conjunctiva/sclera: Conjunctivae normal.     Pupils: Pupils are equal, round, and reactive to light.  Cardiovascular:     Rate and Rhythm: Normal rate and regular rhythm.     Heart sounds: Normal heart sounds.  Pulmonary:     Effort: Pulmonary effort is normal. No respiratory distress.     Breath sounds: Normal breath sounds. No rhonchi.  Abdominal:     General: Bowel sounds are normal.     Palpations: Abdomen is soft.     Tenderness: There is no guarding.     Hernia: No hernia is present.  Musculoskeletal:        General: Normal range of motion.     Cervical back: Normal range of motion.  Skin:    General: Skin is warm and dry.  Neurological:     Mental Status: She is alert and oriented to person, place, and time.     Comments: AAOx3, answering questions and following commands appropriately in between bouts of vomiting; equal strength UE and LE bilaterally; CN grossly intact; moves all extremities appropriately without ataxia; no focal neuro deficits or facial asymmetry appreciated     ED Results / Procedures / Treatments   Labs (all labs ordered are listed, but only abnormal results are displayed) Labs Reviewed  CBC - Abnormal; Notable for the following components:      Result Value   RDW 19.9 (*)    All other components within normal limits  COMPREHENSIVE METABOLIC PANEL - Abnormal; Notable for the following components:   Potassium 3.1 (*)    Glucose, Bld 185 (*)    All other components within normal limits  I-STAT CHEM 8, ED - Abnormal; Notable for the following components:   Potassium 3.1 (*)    Creatinine, Ser 0.40 (*)    Glucose, Bld 181 (*)    Hemoglobin 15.3 (*)    All other components within normal limits  CBG MONITORING, ED - Abnormal; Notable for the following components:   Glucose-Capillary 152 (*)    All other components within normal limits  PROTIME-INR  APTT    DIFFERENTIAL  I-STAT BETA HCG BLOOD, ED ([facility], [facility], [facility] ONLY)    EKG EKG Interpretation  Date/Time:  Sunday October 07 2019 01:04:29 EST Ventricular Rate:  78 PR Interval:  188 QRS Duration: 96 QT Interval:  426 QTC Calculation: 485  R Axis:   60 Text Interpretation: Normal sinus rhythm Prolonged QT Abnormal ECG No old tracing to compare Confirmed by Ward, Cyril Mourning [phone]) on 10/07/2019 1:20:24 AM   Radiology CT Angio Head W or Wo Contrast  Result Date: 10/07/2019 CLINICAL DATA:  Initial evaluation for acute headache, assess for aneurysm. EXAM: CT ANGIOGRAPHY HEAD AND NECK TECHNIQUE: Multidetector CT imaging of the head and neck was performed using the standard protocol during bolus administration of intravenous contrast. Multiplanar CT image reconstructions and MIPs were obtained to evaluate the vascular anatomy. Carotid stenosis measurements (when applicable) are obtained utilizing NASCET criteria, using the distal internal carotid diameter as the denominator. CONTRAST:  48m OMNIPAQUE IOHEXOL 350 MG/ML SOLN COMPARISON:  Prior head CT from earlier same day. FINDINGS: CTA NECK FINDINGS Aortic arch: Visualized aortic arch of normal caliber with normal branch pattern. No hemodynamically significant stenosis about the origin of the great vessels. Visualized subclavian arteries widely patent. Right carotid system: Right common and internal carotid arteries widely patent without stenosis, dissection, or occlusion. Left carotid system: Left common and internal carotid arteries widely patent without stenosis, dissection, or occlusion. Vertebral arteries: Both vertebral arteries arise from the subclavian arteries. Vertebral arteries widely patent without stenosis, dissection, or occlusion. Skeleton: No acute osseous abnormality. No discrete lytic or blastic osseous lesions. Other neck: No other acute soft tissue abnormality within the neck. No mass lesion or adenopathy. Upper chest: Visualized upper chest  demonstrates no acute finding. Subcentimeter calcified granuloma noted within the left upper lobe. Review of the MIP images confirms the above findings CTA HEAD FINDINGS Anterior circulation: Internal carotid arteries widely patent to the termini without stenosis, aneurysm, or other abnormality. A1 segments widely patent. Normal anterior communicating artery. Anterior cerebral arteries widely patent. No M1 stenosis or occlusion. Normal MCA bifurcations. Distal MCA branches well perfused and symmetric. Posterior circulation: Both vertebral arteries widely patent to the vertebrobasilar junction without stenosis. Posterior inferior cerebral arteries patent bilaterally. Basilar widely patent to its distal aspect. Superior cerebral arteries patent bilaterally. Both PCAs primarily supplied via the basilar and are well perfused to their distal aspects. Venous sinuses: Grossly patent allowing for timing the contrast bolus. Anatomic variants: None significant. No intracranial aneurysm or other vascular abnormality. Review of the MIP images confirms the above findings IMPRESSION: Normal CTA of the head and neck. No large vessel occlusion, hemodynamically significant stenosis, or other acute vascular abnormality. No aneurysm. Electronically Signed   By: BJeannine BogaM.D.   On: 10/07/2019 04:57   CT Angio Neck W and/or Wo Contrast  Result Date: 10/07/2019 CLINICAL DATA:  Initial evaluation for acute headache, assess for aneurysm. EXAM: CT ANGIOGRAPHY HEAD AND NECK TECHNIQUE: Multidetector CT imaging of the head and neck was performed using the standard protocol during bolus administration of intravenous contrast. Multiplanar CT image reconstructions and MIPs were obtained to evaluate the vascular anatomy. Carotid stenosis measurements (when applicable) are obtained utilizing NASCET criteria, using the distal internal carotid diameter as the denominator. CONTRAST:  765mOMNIPAQUE IOHEXOL 350 MG/ML SOLN COMPARISON:   Prior head CT from earlier same day. FINDINGS: CTA NECK FINDINGS Aortic arch: Visualized aortic arch of normal caliber with normal branch pattern. No hemodynamically significant stenosis about the origin of the great vessels. Visualized subclavian arteries widely patent. Right carotid system: Right common and internal carotid arteries widely patent without stenosis, dissection, or occlusion. Left carotid system: Left common and internal carotid arteries widely patent without stenosis, dissection, or occlusion. Vertebral arteries: Both vertebral arteries arise from the  subclavian arteries. Vertebral arteries widely patent without stenosis, dissection, or occlusion. Skeleton: No acute osseous abnormality. No discrete lytic or blastic osseous lesions. Other neck: No other acute soft tissue abnormality within the neck. No mass lesion or adenopathy. Upper chest: Visualized upper chest demonstrates no acute finding. Subcentimeter calcified granuloma noted within the left upper lobe. Review of the MIP images confirms the above findings CTA HEAD FINDINGS Anterior circulation: Internal carotid arteries widely patent to the termini without stenosis, aneurysm, or other abnormality. A1 segments widely patent. Normal anterior communicating artery. Anterior cerebral arteries widely patent. No M1 stenosis or occlusion. Normal MCA bifurcations. Distal MCA branches well perfused and symmetric. Posterior circulation: Both vertebral arteries widely patent to the vertebrobasilar junction without stenosis. Posterior inferior cerebral arteries patent bilaterally. Basilar widely patent to its distal aspect. Superior cerebral arteries patent bilaterally. Both PCAs primarily supplied via the basilar and are well perfused to their distal aspects. Venous sinuses: Grossly patent allowing for timing the contrast bolus. Anatomic variants: None significant. No intracranial aneurysm or other vascular abnormality. Review of the MIP images confirms  the above findings IMPRESSION: Normal CTA of the head and neck. No large vessel occlusion, hemodynamically significant stenosis, or other acute vascular abnormality. No aneurysm. Electronically Signed   By: Jeannine Boga M.D.   On: 10/07/2019 04:57   MR BRAIN WO CONTRAST  Result Date: 10/07/2019 CLINICAL DATA:  Headache, acute, normal neuro exam. Additional history provided: Patient presented with worst headache of life along with dizziness and nausea. Left facial droop. EXAM: MRI HEAD WITHOUT CONTRAST TECHNIQUE: Multiplanar, multiecho pulse sequences of the brain and surrounding structures were obtained without intravenous contrast. COMPARISON:  Noncontrast CT head and CT angiogram head/neck 10/07/2019 FINDINGS: Brain: There is no evidence of acute infarct. No evidence of intracranial mass. No midline shift or extra-axial fluid collection. No chronic intracranial blood products. No focal parenchymal signal abnormality is identified. Cerebral volume is normal for age. Vascular: Flow voids maintained within the proximal large arterial vessels. Skull and upper cervical spine: No focal marrow lesion. Diffusely decreased T1 marrow signal within the calvarium and visualized upper cervical spine. Sinuses/Orbits: Visualized orbits demonstrate no acute abnormality. Minimal ethmoid and maxillary sinus mucosal thickening. No significant mastoid effusion. IMPRESSION: Normal MRI appearance of the brain for age. No evidence of acute intracranial abnormality. T1 hypointense marrow signal within the calvarium and visualized upper cervical spine. Although this can be caused by a marrow infiltrative process, the most common causes include anemia, smoking or obesity. Electronically Signed   By: Kellie Simmering DO   On: 10/07/2019 06:40   CT HEAD CODE STROKE WO CONTRAST  Result Date: 10/07/2019 CLINICAL DATA:  Code stroke. Initial evaluation for acute headache, left facial droop. EXAM: CT HEAD WITHOUT CONTRAST TECHNIQUE:  Contiguous axial images were obtained from the base of the skull through the vertex without intravenous contrast. COMPARISON:  None. FINDINGS: Brain: Cerebral volume within normal limits for patient age. No evidence for acute intracranial hemorrhage. No findings to suggest acute large vessel territory infarct. No mass lesion, midline shift, or mass effect. Ventricles are normal in size without evidence for hydrocephalus. No extra-axial fluid collection identified. Vascular: No hyperdense vessel identified. Skull: Scalp soft tissues demonstrate no acute abnormality. Calvarium intact. Sinuses/Orbits: Globes and orbital soft tissues within normal limits. Visualized paranasal sinuses are clear. No mastoid effusion. ASPECTS [facility] Stroke Program Early CT Score) - Ganglionic level infarction (caudate, lentiform nuclei, internal capsule, insula, M1-M3 cortex): 7 - Supraganglionic infarction (M4-M6 cortex): 3 Total  score (0-10 with 10 being normal): 10 IMPRESSION: 1. Negative head CT.  No acute intracranial abnormality. 2. ASPECTS is 10. These results were communicated to Dr. Cheral Marker at [place] 1/3/2021by text page via the [facility] messaging system. Electronically Signed   By: Jeannine Boga M.D.   On: 10/07/2019 01:37    Procedures Procedures (including critical care time)  Medications Ordered in ED Medications  sodium chloride flush (NS) 0.9 % injection 3 mL (has no administration in time range)  sodium chloride 0.9 % bolus 1,000 mL (0 mLs Intravenous Hold 10/07/19 0456)  diphenhydrAMINE (BENADRYL) injection 25 mg (25 mg Intravenous Given 10/07/19 0157)  metoCLOPramide (REGLAN) injection 10 mg (10 mg Intravenous Given 10/07/19 0159)  ketorolac (TORADOL) 30 MG/ML injection 30 mg (30 mg Intravenous Given 10/07/19 0203)  promethazine (PHENERGAN) injection 25 mg (25 mg Intravenous Given 10/07/19 0212)  iohexol (OMNIPAQUE) 350 MG/ML injection 75 mL (75 mLs Intravenous Contrast Given 10/07/19 0417)    ED Course  I  have reviewed the triage vital signs and the nursing notes.  Pertinent labs & imaging results that were available during my care of the patient were reviewed by me and considered in my medical decision making (see chart for details).    MDM Rules/Calculators/A&P  53 year old female presenting to the ED as a headache.  Went to bed is normal around 10:30 PM, woke up with severe headache, nausea, vomiting, and dizziness.  She arrived by POV and was made a code stroke in triage.  Patient is awake, alert, oriented but she is actively vomiting.  She is moving her arms and legs well, does not appear to have a facial droop.  Endorses headache from forehead all the way to the back of her head.  CT of the head has been obtained and is negative.  Plan is to treat as complicated migraine and will reassess.  3:14 AM Patient reassessed.  No longer vomiting.  States headache has improved a lot.  Remains neurologically intact.   Given sprite to drink.  If tolerating PO, can likely discharge home.  Shortly after reassessing patient, noticed that neuro consult note recommended CTA head/neck and MRI.  This was not communicated to [place], initially plan was for treatment of complicated migraine.  Discussed again with neurology, recommended to go ahead with scans and if negative can follow-up with PCP.  6:49 AM CTA's and MRI negative.  Patient without any recurrence of headache, remains neurologically intact.  Feel she is stable for discharge home.  She can follow-up with PCP.  Also given information for neurology if any ongoing issues with headache.  She may return here for any new/acute changes.  Final Clinical Impression(s) / ED Diagnoses Final diagnoses:  Bad headache    Rx / DC Orders ED Discharge Orders    None       Larene Pickett, PA-C 10/07/19 [id number]    Orpah Greek, MD 10/07/19 [phone]

## 2019-10-10 ENCOUNTER — Encounter: Payer: Self-pay | Admitting: Family Medicine

## 2019-10-10 ENCOUNTER — Other Ambulatory Visit: Payer: Self-pay

## 2019-10-10 ENCOUNTER — Ambulatory Visit: Payer: Self-pay | Attending: Family Medicine | Admitting: Family Medicine

## 2019-10-10 VITALS — BP 121/79 | HR 69 | Resp 16 | Wt 182.6 lb

## 2019-10-10 DIAGNOSIS — Z791 Long term (current) use of non-steroidal anti-inflammatories (NSAID): Secondary | ICD-10-CM

## 2019-10-10 DIAGNOSIS — M25512 Pain in left shoulder: Secondary | ICD-10-CM

## 2019-10-10 DIAGNOSIS — R519 Headache, unspecified: Secondary | ICD-10-CM

## 2019-10-10 DIAGNOSIS — E119 Type 2 diabetes mellitus without complications: Secondary | ICD-10-CM

## 2019-10-10 DIAGNOSIS — E782 Mixed hyperlipidemia: Secondary | ICD-10-CM

## 2019-10-10 DIAGNOSIS — M62830 Muscle spasm of back: Secondary | ICD-10-CM

## 2019-10-10 DIAGNOSIS — K5900 Constipation, unspecified: Secondary | ICD-10-CM

## 2019-10-10 DIAGNOSIS — E876 Hypokalemia: Secondary | ICD-10-CM

## 2019-10-10 DIAGNOSIS — G8929 Other chronic pain: Secondary | ICD-10-CM

## 2019-10-10 DIAGNOSIS — Z09 Encounter for follow-up examination after completed treatment for conditions other than malignant neoplasm: Secondary | ICD-10-CM

## 2019-10-10 LAB — POCT GLYCOSYLATED HEMOGLOBIN (HGB A1C): HbA1c, POC (prediabetic range): 6.2 % (ref 5.7–6.4)

## 2019-10-10 LAB — GLUCOSE, POCT (MANUAL RESULT ENTRY): POC Glucose: 130 mg/dL — AB (ref 70–99)

## 2019-10-10 MED ORDER — TRUE METRIX BLOOD GLUCOSE TEST VI STRP: ORAL_STRIP | 12 refills | Status: DC

## 2019-10-10 MED ORDER — METFORMIN HCL 500 MG PO TABS: 500.0000 mg | ORAL_TABLET | Freq: Two times a day (BID) | ORAL | 3 refills | Status: DC

## 2019-10-10 MED ORDER — ATORVASTATIN CALCIUM 20 MG PO TABS: 20.0000 mg | ORAL_TABLET | Freq: Every day | ORAL | 3 refills | Status: DC

## 2019-10-10 MED ORDER — POLYETHYLENE GLYCOL 3350 17 GM/SCOOP PO POWD: ORAL | 11 refills | Status: DC

## 2019-10-10 MED ORDER — ONDANSETRON HCL 4 MG PO TABS: 4.0000 mg | ORAL_TABLET | Freq: Three times a day (TID) | ORAL | 3 refills | Status: DC | PRN

## 2019-10-10 MED ORDER — CYCLOBENZAPRINE HCL 5 MG PO TABS: 5.0000 mg | ORAL_TABLET | Freq: Three times a day (TID) | ORAL | 3 refills | Status: DC | PRN

## 2019-10-10 MED ORDER — FAMOTIDINE 20 MG PO TABS: 20.0000 mg | ORAL_TABLET | Freq: Two times a day (BID) | ORAL | 3 refills | Status: DC

## 2019-10-10 MED FILL — TRUE METRIX GLUCOSE TEST ST: 33 days supply | Qty: 100 | Fill #0

## 2019-10-10 MED FILL — ?ONDANSETRON HCL 4 MG TABLE: 4 | 6 days supply | Qty: 20 | Fill #0

## 2019-10-10 MED FILL — FAMOTIDINE 20 MG TABS: 20 | 30 days supply | Qty: 60 | Fill #0

## 2019-10-10 MED FILL — CYCLOBENZAPRINE 5 MG TABLET: 5 | 20 days supply | Qty: 60 | Fill #0

## 2019-10-10 MED FILL — ?METFORMIN HCL 500MG TABLET: 500 | 30 days supply | Qty: 120 | Fill #0

## 2019-10-10 MED FILL — POLYETHYLENE GLYCOL 3350 PO: 17 | 30 days supply | Qty: 476 | Fill #0

## 2019-10-10 MED FILL — ?ATORVASTATIN 20 MG TABLET: 20 | 30 days supply | Qty: 30 | Fill #0

## 2019-10-10 NOTE — Progress Notes (Signed)
it   Patient ID: Elaine Burch, female    DOB: 1966-02-17  MRN: [id number]  Due to language barrier, Stratus video interpretation system was used at today's visit  SUBJECTIVE:  Elaine Burch is a 54 y.o. female who presents for hospital f/u status post recent emergency department visit at [facility] due to the worst headache of her life for which she was evaluated for recurrent CVA.  She reports that she has had no further headaches since her emergency department visit.  She states that actually she and her husband were having sexual intercourse when she had onset of headache.  She states that she has had headaches in the past during or immediately after sexual intercourse however this time she started having nausea and vomiting and her husband became concerned that she may be having another stroke and took her to the hospital for further evaluation.  She also has a history of migraine type headaches but states that the headache for which she was seen at the emergency department was worse than any headache that she had had in the past.             She reports that her home blood sugars have been in the 110s to 120 or lower.  She was surprised that her blood sugar today was 130 as she has not yet eaten but she did have a burrito last night and ate later than usual and she believes that this was why her blood sugar was higher today.  She has had no urinary frequency, no increased thirst and no blurred vision repleted her blood sugars.  She denies any numbness or tingling in her feet.          She continues to have issues with pain in her left and right upper back but also has pain in the scapular area of the left and right shoulder but pain is worse on the left.  She gets a sharp sensation with certain movements of her arm.  She takes over-the-counter ibuprofen as needed for pain.  She admits that she occasionally gets some reflux type symptoms with burping/belching and  occasional backwash of bad tasting fluid into her mouth/throat.  She denies any mid upper abdominal pain.  No blood in the stool or black stools.  She does feel as if she has constipation often.  She states that this has been longstanding.  Where: [facility] emergency department  When: October 07, 2019 Primary Dx: Bad headache-had code stroke evaluation due to complaint of worst headache of her life along with nausea and vomiting   Patient Active Problem List   Diagnosis Date Noted  . Muscle spasm of back 11/24/2013  . Dental caries 11/24/2013  . Visual changes 11/24/2013     Current Outpatient Medications on File Prior to Visit  Medication Sig Dispense Refill  . atorvastatin (LIPITOR) 20 MG tablet Take 1 tablet (20 mg total) by mouth daily. To lower cholesterol 30 tablet 11  . Blood Glucose Monitoring Suppl (TRUE METRIX METER) w/Device KIT Use to check blood sugars 2-3 times per day 1 kit 0  . cyclobenzaprine (FLEXERIL) 5 MG tablet Take 1 tablet (5 mg total) by mouth 3 (three) times daily as needed for muscle spasms. 60 tablet 3  . famotidine (PEPCID) 20 MG tablet Take 1 tablet (20 mg total) by mouth 2 (two) times daily. To protect stomach 60 tablet 11  . ferrous sulfate 325 (65 FE) MG tablet Take 1 tablet (  325 mg total) by mouth daily with breakfast. 30 tablet 3  . glucose blood (TRUE METRIX BLOOD GLUCOSE TEST) test strip Use as instructed to check blood sugar 2-3 times 100 each 12  . ibuprofen (ADVIL) 600 MG tablet Take 1 tablet (600 mg total) by mouth every 8 (eight) hours as needed for moderate pain. Take after eating 60 tablet 2  . metFORMIN (GLUCOPHAGE) 500 MG tablet Take 1 tablet (500 mg total) by mouth 2 (two) times daily with a meal. For 1 week then increase to 2 pills twice daily after meal 120 tablet 3  . polyethylene glycol powder (GLYCOLAX/MIRALAX) 17 GM/SCOOP powder Use 17 gm scoop once per day mixed with fluid as needed to help with constipation 578 g 11  .  traMADol (ULTRAM) 50 MG tablet Take 1-2 tablets (50-100 mg total) by mouth every 6 (six) hours as needed. 20 tablet 0  . TRUEplus Lancets 28G MISC Use when checking blood sugars 100 each 11   No current facility-administered medications on file prior to visit.    No Known Allergies  Social History   Tobacco Use  . Smoking status: Never Smoker  . Smokeless tobacco: Never Used  Substance Use Topics  . Alcohol use: No  . Drug use: No     Family History  Problem Relation Age of Onset  . Diabetes Mother   . Cancer Father     Past Surgical History:  Procedure Laterality Date  . NO PAST SURGERIES      ROS: Review of Systems  Constitutional: Positive for fatigue (Occasional, mild).  HENT: Negative for sore throat and trouble swallowing.   Eyes: Negative for photophobia and visual disturbance.  Respiratory: Negative for cough and shortness of breath.   Cardiovascular: Negative for chest pain and palpitations.  Gastrointestinal: Positive for constipation. Negative for abdominal pain, blood in stool, diarrhea and nausea.  Endocrine: Negative for cold intolerance, heat intolerance, polydipsia, polyphagia and polyuria.  Genitourinary: Negative for dysuria and frequency.  Musculoskeletal: Positive for arthralgias and back pain.  Neurological: Positive for headaches (Occasional but not currently; history of migraines). Negative for dizziness.  Hematological: Negative for adenopathy. Does not bruise/bleed easily.  Psychiatric/Behavioral: Negative for self-injury and suicidal ideas.     PHYSICAL EXAM: BP 121/79   Pulse 69   Resp 16   Wt 182 lb 9.6 oz (82.8 kg)   SpO2 95%   BMI 33.40 kg/m   Physical Exam Vitals and nursing note reviewed.  Constitutional:      General: She is not in acute distress.    Appearance: Normal appearance.     Comments: Overweight for height older female in NAD, wearing face mask as per office [id number] policy  Cardiovascular:     Rate and Rhythm:  Normal rate and regular rhythm.     Pulses:          Dorsalis pedis pulses are 2+ on the right side and 2+ on the left side.       Posterior tibial pulses are 2+ on the right side and 2+ on the left side.  Pulmonary:     Effort: Pulmonary effort is normal.     Breath sounds: Normal breath sounds.  Abdominal:     Palpations: Abdomen is soft.     Tenderness: There is no abdominal tenderness. There is no right CVA tenderness, left CVA tenderness, guarding or rebound.  Musculoskeletal:        General: Tenderness present.     Cervical back: Normal  range of motion and neck supple. No tenderness.     Right lower leg: No edema.     Left lower leg: No edema.     Right foot: Normal range of motion. Deformity present. No bunion, Charcot foot, foot drop or prominent metatarsal heads.     Left foot: Normal range of motion. No deformity, bunion, Charcot foot, foot drop or prominent metatarsal heads.     Comments: Tenderness to palpation at left posterior shoulder/scapular area and spasm of upper back/trapezius area left greater than right  Feet:     Right foot:     Protective Sensation: 10 sites tested. 10 sites sensed.     Skin integrity: Skin integrity normal. No ulcer, blister, skin breakdown, erythema or warmth.     Left foot:     Protective Sensation: 10 sites tested. 10 sites sensed.     Skin integrity: Skin integrity normal. No ulcer, blister, skin breakdown, erythema or warmth.     Comments: Toes 2-5 on right foot are shortened Lymphadenopathy:     Cervical: No cervical adenopathy.  Skin:    General: Skin is warm and dry.  Neurological:     General: No focal deficit present.     Mental Status: She is alert and oriented to person, place, and time.     Cranial Nerves: No cranial nerve deficit.  Psychiatric:        Mood and Affect: Mood normal.        Behavior: Behavior normal.         ASSESSMENT AND PLAN: 1. Type 2 diabetes mellitus without complication, without long-term current  use of insulin ([place]) She reports that her home blood sugars have been very well controlled and she has had no hypoglycemic episodes.  She is very pleased with today's hemoglobin A1c reading of 6.2.  She will also have urine microalbumin creatinine ratio, BMP and lipid panel at today's visit.  Refills provided of her Metformin and Lipitor as well as refill of test strips.  Continue healthy, low carbohydrate diet and low impact exercise such as walking.  Diabetic foot care also discussed. - Glucose (CBG) - HgB A1c - Microalbumin/Creatinine Ratio, Urine - Basic Metabolic Panel - atorvastatin (LIPITOR) 20 MG tablet; Take 1 tablet (20 mg total) by mouth daily. To lower cholesterol  Dispense: 90 tablet; Refill: 3 - glucose blood (TRUE METRIX BLOOD GLUCOSE TEST) test strip; Use as instructed to check blood sugar 2-3 times  Dispense: 100 each; Refill: 12 - metFORMIN (GLUCOPHAGE) 500 MG tablet; Take 1 tablet (500 mg total) by mouth 2 (two) times daily with a meal. For 1 week then increase to 2 pills twice daily after meal  Dispense: 120 tablet; Refill: 3 - Lipid panel  2. Nonintractable episodic headache, unspecified headache type; 4.  Encounter for examination following treatment at hospital Patient with history of recurrent episodic migraine type headaches as well as postcoital headaches which were discussed with patient at today's visit.  Refill provided for Zofran to take as needed and she may try over-the-counter Excedrin Migraine. - ondansetron (ZOFRAN) 4 MG tablet; Take 1 tablet (4 mg total) by mouth every 8 (eight) hours as needed for nausea or vomiting.  Dispense: 20 tablet; Refill: 3  3. Hypokalemia BMP in follow-up of diabetes and prior low potassium - Basic Metabolic Panel  5. Mixed dyslipidemia Repeat lipid panel at today's visit and refill of atorvastatin due mixed dyslipidemia and diabetes which increases risk for heart disease - atorvastatin (LIPITOR) 20 MG  tablet; Take 1 tablet (20 mg  total) by mouth daily. To lower cholesterol  Dispense: 90 tablet; Refill: 3 - Lipid panel  6. Chronic left shoulder pain; muscle spasm of back May continue the use of over-the-counter nonsteroidal anti-inflammatory medication or acetaminophen as needed for chronic left shoulder pain.  Prescription for cyclobenzaprine to help with muscle spasm of the upper back which contributes to her shoulder area pain.  Keep upcoming orthopedic appointment in early February.  7. Encounter for long-term (current) use of NSAIDs Prescription for famotidine/Pepcid twice daily as patient reports some reflux symptoms and she has recurrent use of ibuprofen for upper back pain/posterior shoulder pain - famotidine (PEPCID) 20 MG tablet; Take 1 tablet (20 mg total) by mouth 2 (two) times daily. To protect stomach  Dispense: 180 tablet; Refill: 3  8. Constipation, unspecified constipation type Prescription for MiraLAX to take daily to help with constipation - polyethylene glycol powder (GLYCOLAX/MIRALAX) 17 GM/SCOOP powder; Use 17 gm scoop once per day mixed with fluid as needed to help with constipation  Dispense: 578 g; Refill: 11   Future Appointments  Date Time Provider [place]  11/12/2019  3:00 PM Mcarthur Rossetti, MD [facility] None    Antony Blackbird, MD, FACP

## 2019-10-10 NOTE — Patient Instructions (Signed)
Informacin bsica sobre la diabetes Diabetes Basics  La diabetes (diabetes mellitus) es una enfermedad de larga duracin (crnica). Se produce cuando el cuerpo no utiliza Occupational hygienist (glucosa) que se libera de los alimentos despus de comer. La diabetes puede deberse a uno de [employer] o a ambos:  El pncreas no produce suficiente cantidad de una hormona llamada insulina.  El cuerpo no reacciona de forma normal a la insulina que produce. La insulina permite que ciertos azcares (glucosa) ingresen a las clulas del cuerpo. Esto le proporciona energa. Si tiene diabetes, los azcares no pueden ingresar a las clulas. Esto produce un aumento del nivel de Dispensing optician (hiperglucemia). Sigue estas instrucciones en tu casa: Cmo se trata la diabetes? Es posible que tenga que administrarse insulina u otros medicamentos para la diabetes todos los [place] para mantener el nivel de Location manager en la sangre equilibrado. Adminstrese los medicamentos para la diabetes todos los [place] se lo haya indicado el mdico. Haga una lista de los medicamentos para la diabetes aqu: Medicamentos para la diabetes  Nombre del medicamento: ______________________________ ? Cantidad (dosis): ________________ Mellody Drown (a.m./p.m.): _______________ Wilford Grist: ___________________________________  Micki Riley medicamento: ______________________________ ? Cantidad (dosis): ________________ Mellody Drown (a.m./p.m.): _______________ Wilford Grist: ___________________________________  Micki Riley medicamento: ______________________________ ? Cantidad (dosis): ________________ Mellody Drown (a.m./p.m.): _______________ Wilford Grist: ___________________________________ Si [place] insulina, aprender cmo aplicrsela con inyecciones. Es posible que deba ajustar la cantidad en funcin de los alimentos que coma. Haga una lista de los tipos de [place] aqu: Insulina  Tipo de insulina: ______________________________ ? Cantidad (dosis):  ________________ Mellody Drown (a.m./p.m.): _______________ Wilford Grist: ___________________________________  Suezanne Jacquet: ______________________________ ? Cantidad (dosis): ________________ Mellody Drown (a.m./p.m.): _______________ Wilford Grist: ___________________________________  Suezanne Jacquet: ______________________________ ? Cantidad (dosis): ________________ Mellody Drown (a.m./p.m.): _______________ Wilford Grist: ___________________________________  Suezanne Jacquet: ______________________________ ? Cantidad (dosis): ________________ Mellody Drown (a.m./p.m.): _______________ Wilford Grist: ___________________________________  Suezanne Jacquet: ______________________________ ? Cantidad (dosis): ________________ Mellody Drown (a.m./p.m.): _______________ Wilford Grist: ___________________________________ Cmo me controlo el nivel de azcar en la sangre?  Controle sus niveles de azcar en la sangre con un medidor de glucemia segn las indicaciones del mdico. El mdico fijar los objetivos del tratamiento para usted. Generalmente, los resultados de los niveles de azcar en la sangre deben ser los siguientes:  Antes de las comidas (preprandial): de 80 a 133m/dl (de 4,4 a 7,284ml/l).  Despus de las comidas (posprandial): por debajo de 18049ml (52m65ml).  Nivel de A1c: menos del 7%. Anote las veces que se controlar los niveles de azcar en la sangre: Controles de azcar en la sangre  Hora: _______________ NotaWilford Grist_________________________________  HoraMellody Drown_____________ Notas: ___________________________________  Hora: _______________ Notas: ___________________________________  Hora: _______________ Notas: ___________________________________  Hora: _______________ Notas: ___________________________________  Hora: _______________ Notas: ___________________________________  Qu dSander Nephewo saber acerca del nivel bajo de azcar en la sangre? Un nivel bajo de azcar en la sangre se denomina hipoglucemia. Este cuadro ocurre cuando el  nivel de azcar en la sangre es igual o menor que 70mg71m(3,9mmol51m. Entre los sntomas, se pueden incluir los siguientes:  Sentir: ? HambreMount Enterpriseeocupacin o nervios (ansiedad). ? Sudoracin y piel hIntel Corporationnfusin. ? Mareos. ? Somnolencia. ? Ganas de vomitar (nuseas).  Tener: ? Latidos cardacos acelerados. ? Dolor de cabezaNetherlandsmbios en la visin. ? Hormigueo y falta de sensibilidad (entumecimiento) alrededor de la boca, los labios o la lenguaHawkinsvillevimientos espasmdicos que no puede controlar (convulsiones).  Dificultades para hacer lo siguiente: ? Moverse (coordinacin). ? Dormir. ? Desmayos. ? Molestarse con facilidad (irritabilidad). Tratamiento del nivelCactus  bajo de azcar en la sangre Para tratar un nivel bajo de azcar en la sangre, ingiera un alimento o una bebida azucarada de inmediato. Si puede pensar con claridad y tragar de manera segura, siga la regla 15/15, que consiste en lo siguiente:  Consuma 15gramos de un hidrato de carbono de accin rpida (carbohidrato). Hable con su mdico acerca de cunto debera consumir.  Algunos hidratos de carbono de accin rpida son: ? Comprimidos de azcar (pastillas de glucosa). Consuma 3o 4pastillas de glucosa. ? De 6 a 8unidades de caramelos duros. ? De 4 a 6onzas (de 120 a 145m) de jugo de frutas. ? De 4 a 6onzas (de 120 a 1573m de refresco comn (no diettico). ? 1 cucharada (1552mde miel o azcar.  Contrlese el nivel de azcar en la sangre 61m41mos despus de ingerir el hidrato de carbono.  Si el nivel de azcar en la sangre todava es igual o menor que 70mg16m(3,9mmol52m, ingiera nuevamente 15gramos de un hidrato de carbono.  Si el nivel de azcar en la sangre no supera los 70mg/d105m,9mmol/l89mespus de 3intentos, solicite ayuda de inmediato.  Ingiera una comida o una colacin en el transcurso de 1hora despus de que el nivel de azcar en la sangre se haya normalizado. Tratamiento del nivel  muy bajo de azcar en la sangre Si el nivel de azcar en la sangre es igual o menor que 54mg/dl 27mol/l),47mgnifica que est muy bajo (hipoglucemia grave). Esto es una emergeEngineer, maintenance (IT)e a ver si los sntomas desaparecen. Solicite atencin mdica de inmediato. Comunquese con el servicio de emergencias de su localidad (911 en los [place]). No conduzca por sus propios medios hasta el hGoldman Sachs Preguntas para hacerle al mdico  Es necesario que me rena con un instrucRadio broadcast assistantdado de la diabetes?  Qu equipos necesitar para cuidarme en casa?  Qu medicamentos para la diabetes necesito? Cundo debo tomarlos?  Con qu frecuencia debo controlar mi nivel de azcar en la sangre?  A qu nmero puedo llamar si tengo preguntas?  Cundo es mi prxima cita con el mdico?  Dnde puedo encontrar un grupo de apoyo para las personas con diabetes? Dnde buscar ms informacin  American Diabetes Association (Asociacin Estadounidense de la Diabetes): www.diabetes.org  American Association of Diabetes Educators (Asociacin Estadounidense de Instructores para el Cuidado de la Diabetes): www.diabeteseducator.org/patient-resources Comunquese con un mdico si:  El nivel de azcar en la sangre es igual o mayor que 240mg/dl (61mmol/dl) d63mnte 2das seguidos.  Ha estado enfermo o ha tenido fiebre durante 2das o ms y no mejora.  Tiene alguno de estos problemas durante ms de 6horas: ? No puede comer ni beber. ? Siente malestar estomacal (nuseas). ? Vomita. ? Presenta heces lquidas (diarrea). Solicite ayuda inmediatamente si:  El nivel de azcar en la sangre est por debajo de 54mg/dl (3mm65m).  Se53mente confundida.  Tiene dificultad para hacer lo siguiente: ? Pensar con claridad. ? La respiracin. Resumen  La diabetes (diabetes mellitus) es una enfermedad de larga duracin (crnica). Se produce cuando el cuerpo no utiliza correctamente Occupational hygieniste se libera de los alimentos despus de la digestin.  Aplquese la insulina y tome los medicamentos para la diabetes como se lo hayan indicado.  Contrlese el nivel de azcar en la sangre todos los das, con la frDow Cityencia que le hayan indicado.  Concurra a todas las visitas de seguimiento como se lo haya indicado el mdico. Esto es importante. Esta informacin no tiene como fin reempMarine scientist  el consejo del mdico. Asegrese de hacerle al mdico cualquier pregunta que tenga. Document Revised: 11/15/2018 Document Reviewed: 01/27/2018 Elsevier Patient Education  [place].

## 2019-10-11 LAB — BASIC METABOLIC PANEL WITH GFR
BUN/Creatinine Ratio: 14 (ref 9–23)
BUN: 8 mg/dL (ref 6–24)
CO2: 25 mmol/L (ref 20–29)
Calcium: 9.9 mg/dL (ref 8.7–10.2)
Chloride: 101 mmol/L (ref 96–106)
Creatinine, Ser: 0.57 mg/dL (ref 0.57–1.00)
GFR calc Af Amer: 124 mL/min/1.73
GFR calc non Af Amer: 108 mL/min/1.73
Glucose: 100 mg/dL — ABNORMAL HIGH (ref 65–99)
Potassium: 4.2 mmol/L (ref 3.5–5.2)
Sodium: 140 mmol/L (ref 134–144)

## 2019-10-11 LAB — MICROALBUMIN / CREATININE URINE RATIO
Creatinine, Urine: 73.3 mg/dL
Microalb/Creat Ratio: 28 mg/g{creat} (ref 0–29)
Microalbumin, Urine: 20.6 ug/mL

## 2019-10-11 LAB — LIPID PANEL
Chol/HDL Ratio: 3.7 ratio (ref 0.0–4.4)
Cholesterol, Total: 214 mg/dL — ABNORMAL HIGH (ref 100–199)
HDL: 58 mg/dL
LDL Chol Calc (NIH): 122 mg/dL — ABNORMAL HIGH (ref 0–99)
Triglycerides: 194 mg/dL — ABNORMAL HIGH (ref 0–149)
VLDL Cholesterol Cal: 34 mg/dL (ref 5–40)

## 2019-10-12 ENCOUNTER — Telehealth (INDEPENDENT_AMBULATORY_CARE_PROVIDER_SITE_OTHER): Payer: Self-pay

## 2019-10-12 NOTE — Telephone Encounter (Signed)
-----   Message from Antony Blackbird, MD sent at 10/11/2019  5:25 PM EST ----- Normal urine microalbumin/creatinine ratio. Normal electrolytes-glucose 100. Lipid panel shows a mild improvement in triglycerides but little change in LDL/bad cholesterol which is 122 and goal is 70 or less. Please ask if she has been taking her cholesterol medication every day and if so then she would likely benefit from a different cholesterol medication or dose increase of current med.

## 2019-10-12 NOTE — Telephone Encounter (Signed)
Call placed to patient using language interpreter Jonna Clark [phone]) patient verified date of birth. She is aware that mircoalbumin was and electrolytes are normal. Lipid panel shows a mild improvement in triglycerides but little change in LDL/bad cholesterol which is 122 and goal is 70 or less. Asked if she was taking cholesterol medication daily. Patient stated she was not prescribed any cholesterol medication prior to yesterday. She has picked up atorvastatin and will begin taking today. She verbalized understanding of results. Nat Christen, CMA  .

## 2019-10-12 NOTE — Telephone Encounter (Signed)
OK, will discuss lipid recheck at future appointment since just obtaining medication

## 2019-10-14 ENCOUNTER — Encounter: Payer: Self-pay | Admitting: Family Medicine

## 2019-11-12 ENCOUNTER — Ambulatory Visit: Payer: Self-pay | Admitting: Orthopaedic Surgery

## 2019-11-13 ENCOUNTER — Other Ambulatory Visit: Payer: Self-pay

## 2019-11-13 ENCOUNTER — Encounter: Payer: Self-pay | Admitting: Orthopaedic Surgery

## 2019-11-13 ENCOUNTER — Ambulatory Visit (INDEPENDENT_AMBULATORY_CARE_PROVIDER_SITE_OTHER): Payer: Self-pay | Admitting: Orthopaedic Surgery

## 2019-11-13 ENCOUNTER — Ambulatory Visit: Payer: Self-pay

## 2019-11-13 DIAGNOSIS — G8929 Other chronic pain: Secondary | ICD-10-CM

## 2019-11-13 DIAGNOSIS — M25512 Pain in left shoulder: Secondary | ICD-10-CM

## 2019-11-13 DIAGNOSIS — M7502 Adhesive capsulitis of left shoulder: Secondary | ICD-10-CM

## 2019-11-13 NOTE — Progress Notes (Signed)
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  She has had an intra-articular injection of the left shoulder on December 2 by Dr. Junius Roads under direct ultrasound.  She is been through therapy as well.  She is making progress and does feel like her range of motion is better but she still having some soreness.  On examination her right shoulder has full range of motion.  Her left shoulder which is a frozen shoulder still lacks full abduction and full forward flexion by several degrees.  It has improved though.  She is not a diabetic.  I talked her length through an interpreter that I feel it is worth trying 1 more intra-articular injection in her left shoulder joint under ultrasound.  We will coordinate this with Dr. Junius Roads as a consultation for providing this injection this morning.  I would like to see her back in about 4 weeks to see how she is doing overall.  Should continue work on getting that shoulder moving.  We did talk about the possibility of surgery in the future if we get to where she is not making progress and conservative treatment fails.

## 2019-11-13 NOTE — Progress Notes (Signed)
Subjective: Patient is here for ultrasound-guided intra-articular left glenohumeral injection.  Had improved ROM after the first one.  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  Procedure: Ultrasound-guided left glenohumeral injection: After sterile prep with Betadine, injected 8 cc 1% lidocaine without epinephrine and 40 mg methylprednisolone using a 22-gauge spinal needle, passing the needle through approach into the glenohumeral joint.  Injectate seen filling joint capsule.

## 2019-11-19 MED FILL — TRUE METRIX GLUCOSE TEST ST: 33 days supply | Qty: 100 | Fill #1

## 2019-11-19 MED FILL — ?ATORVASTATIN 20 MG TABLET: 20 | 30 days supply | Qty: 30 | Fill #1

## 2019-11-19 MED FILL — TRUEplus LANCETS 28G MISC: 30 days supply | Qty: 100 | Fill #1

## 2019-12-03 MED FILL — ?METFORMIN HCL 500MG TABLET: 500 | 30 days supply | Qty: 120 | Fill #1

## 2019-12-21 ENCOUNTER — Other Ambulatory Visit: Payer: Self-pay | Admitting: Family Medicine

## 2019-12-28 ENCOUNTER — Other Ambulatory Visit: Payer: Self-pay

## 2019-12-28 DIAGNOSIS — Z1231 Encounter for screening mammogram for malignant neoplasm of breast: Secondary | ICD-10-CM

## 2020-01-02 MED FILL — ?ATORVASTATIN 20 MG TABLET: 20 | 30 days supply | Qty: 30 | Fill #2

## 2020-01-02 MED FILL — ?METFORMIN HCL 500MG TABLET: 500 | 30 days supply | Qty: 120 | Fill #2

## 2020-01-17 ENCOUNTER — Ambulatory Visit: Payer: Self-pay

## 2020-02-18 MED FILL — ?ATORVASTATIN 20 MG TABLET: 20 | 30 days supply | Qty: 30 | Fill #3

## 2020-02-18 MED FILL — METFORMIN HCL 500 MG TABS: 500 | 30 days supply | Qty: 120 | Fill #3

## 2020-03-05 ENCOUNTER — Telehealth: Payer: Self-pay

## 2020-03-05 NOTE — Telephone Encounter (Signed)
Ok, or you need to see her?

## 2020-03-05 NOTE — Telephone Encounter (Signed)
Patient called and states she has a previous work note from 2020 with restrictions. Would like another work note with restrictions with most recent date. Cleans offices-housekeeping.    CB [phone] or [phone]

## 2020-03-05 NOTE — Telephone Encounter (Signed)
I did see her in February of this year.  She was having issues with a frozen shoulder and even Dr. Junius Roads saw her and placed an injection in her shoulder.  I am fine with her having work restrictions in terms of a letter stating that she should avoid overhead activity and lifting nothing with her shoulders greater than 10 to 15 pounds.  We can also see her in the office at any time if she would like.

## 2020-03-06 NOTE — Telephone Encounter (Signed)
Patient aware.

## 2020-03-06 NOTE — Telephone Encounter (Signed)
Can you let her know that her letter is at the front desk please

## 2020-03-10 ENCOUNTER — Ambulatory Visit: Payer: Self-pay | Admitting: Family Medicine

## 2020-03-11 NOTE — Progress Notes (Signed)
Patient ID: Elaine Burch, female    DOB: 1966/02/03  MRN: [id number]  CC: Diabetes and high blood pressure follow-up  Subjective: Elaine Burch is a 54 y.o. female with history of diabetes and dyslipidemia who presents for follow-up of chronic conditions.   1. DIABETES TYPE 2 FOLLOW-UP: Have you eaten today: Yes, bread, eggs, orange juice Last A1C: 6.3, 03/14/2020 Med Adherence:  '[x]'  Yes    '[]'  No Medication side effects:  '[x]'  Yes, dizzy sometimes  Home Monitoring?  '[x]'  Yes    '[]'  No Home glucose results range: 95, 97,  98, 108, 115, 120 Diet Adherence: '[x]'  Yes    '[]'  No Exercise: '[x]'  Yes    '[]'  No Hypoglycemic episodes?: '[]'  Yes    '[x]'  No Numbness of the feet? '[]'  Yes    '[x]'  No Retinopathy hx? '[]'  Yes    '[x]'  No Last eye exam: none  Comments:  Last visit 10/10/2019 with  Dr. Chapman Fitch. During that encounter Metformin and Lipitor continued. Microalbumin creatinine ratio, BMP, and lipid panel obtained.   2. DYSLIPIDEMIA FOLLOW-UP: Last Lipid Panel results:  HDL  Date Value Ref Range Status  10/10/2019 58 >39 mg/dL Final   Triglycerides  Date Value Ref Range Status  10/10/2019 194 (H) 0 - 149 mg/dL Final  Are you fasting today: '[]'  Yes '[x]'  No Med Adherence: '[x]'  Yes    '[]'  No Medication side effects: '[]'  Yes    '[x]'  No Muscle aches:  '[]'  Yes    '[x]'  No Diet Adherence: '[x]'  Yes    '[]'  No Comments:   Last visit 10/10/2019 with Dr. Chapman Fitch. During that encounter Atorvastatin continued.  Patient Active Problem List   Diagnosis Date Noted  . Muscle spasm of back 11/24/2013  . Dental caries 11/24/2013  . Visual changes 11/24/2013     Current Outpatient Medications on File Prior to Visit  Medication Sig Dispense Refill  . atorvastatin (LIPITOR) 20 MG tablet Take 1 tablet (20 mg total) by mouth daily. To lower cholesterol 90 tablet 3  . Blood Glucose Monitoring Suppl (TRUE METRIX METER) w/Device KIT Use to check blood sugars 2-3 times per day 1 kit 0  . cyclobenzaprine (FLEXERIL) 5  MG tablet Take 1 tablet (5 mg total) by mouth 3 (three) times daily as needed for muscle spasms. 60 tablet 3  . famotidine (PEPCID) 20 MG tablet Take 1 tablet (20 mg total) by mouth 2 (two) times daily. To protect stomach 180 tablet 3  . glucose blood (TRUE METRIX BLOOD GLUCOSE TEST) test strip Use as instructed to check blood sugar 2-3 times 100 each 12  . ibuprofen (ADVIL) 600 MG tablet Take 1 tablet (600 mg total) by mouth every 8 (eight) hours as needed for moderate pain. Take after eating 60 tablet 2  . metFORMIN (GLUCOPHAGE) 500 MG tablet Take 1 tablet (500 mg total) by mouth 2 (two) times daily with a meal. For 1 week then increase to 2 pills twice daily after meal 120 tablet 3  . ondansetron (ZOFRAN) 4 MG tablet Take 1 tablet (4 mg total) by mouth every 8 (eight) hours as needed for nausea or vomiting. 20 tablet 3  . polyethylene glycol powder (GLYCOLAX/MIRALAX) 17 GM/SCOOP powder Use 17 gm scoop once per day mixed with fluid as needed to help with constipation 578 g 11  . traMADol (ULTRAM) 50 MG tablet Take 1-2 tablets (50-100 mg total) by mouth every 6 (six) hours as needed. 20 tablet 0  . TRUEplus Lancets 28G MISC  Use when checking blood sugars 100 each 11   No current facility-administered medications on file prior to visit.    No Known Allergies  Social History   Socioeconomic History  . Marital status: Married    Spouse name: Not on file  . Number of children: Not on file  . Years of education: Not on file  . Highest education level: 6th grade  Occupational History  . Not on file  Tobacco Use  . Smoking status: Never Smoker  . Smokeless tobacco: Never Used  Substance and Sexual Activity  . Alcohol use: No  . Drug use: No  . Sexual activity: Yes    Birth control/protection: None  Other Topics Concern  . Not on file  Social History Narrative  . Not on file   Social Determinants of Health   Financial Resource Strain:   . Difficulty of Paying Living Expenses:     Food Insecurity:   . Worried About Charity fundraiser in the Last Year:   . Arboriculturist in the Last Year:   Transportation Needs:   . Film/video editor (Medical):   Marland Kitchen Lack of Transportation (Non-Medical):   Physical Activity:   . Days of Exercise per Week:   . Minutes of Exercise per Session:   Stress:   . Feeling of Stress :   Social Connections:   . Frequency of Communication with Friends and Family:   . Frequency of Social Gatherings with Friends and Family:   . Attends Religious Services:   . Active Member of Clubs or Organizations:   . Attends Archivist Meetings:   Marland Kitchen Marital Status:   Intimate Partner Violence:   . Fear of Current or Ex-Partner:   . Emotionally Abused:   Marland Kitchen Physically Abused:   . Sexually Abused:     Family History  Problem Relation Age of Onset  . Diabetes Mother   . Cancer Father     Past Surgical History:  Procedure Laterality Date  . NO PAST SURGERIES      ROS: Review of Systems Negative except as stated above  PHYSICAL EXAM: Vitals with BMI 03/14/2020 10/10/2019 10/07/2019  Height - - -  Weight 182 lbs 6 oz 182 lbs 10 oz -  BMI - - -  Systolic 600 459 977  Diastolic 79 79 53  Pulse 69 69 -  SpO2- 96%, room air  Temperature- 97.3F, oral  Physical Exam General appearance - alert, well appearing, and in no distress and oriented to person, place, and time Mental status - alert, oriented to person, place, and time, normal mood, behavior, speech, dress, motor activity, and thought processes Eyes - pupils equal and reactive, extraocular eye movements intact Neck - supple, no significant adenopathy Lymphatics - no palpable lymphadenopathy, no hepatosplenomegaly Chest - clear to auscultation, no wheezes, rales or rhonchi, symmetric air entry, no tachypnea, retractions or cyanosis Heart - normal rate, regular rhythm, normal S1, S2, no murmurs, rubs, clicks or gallops Neurological - alert, oriented, normal speech, no focal  findings or movement disorder noted, neck supple without rigidity, cranial nerves II through XII intact, funduscopic exam normal, discs flat and sharp, DTR's normal and symmetric, motor and sensory grossly normal bilaterally, normal muscle tone, no tremors, strength 5/5, Romberg sign negative, normal gait and station Musculoskeletal - no joint tenderness, deformity or swelling, no muscular tenderness noted, full range of motion without pain Extremities - peripheral pulses normal, no pedal edema, no clubbing or cyanosis, monofilament  sensory exam is normal in both feet Skin - normal coloration and turgor, no rashes, no suspicious skin lesions noted DM Foot Exam Color: normal Sensation Monofilament:diminished right and left plantar Sensation Vibration:normal right and left dorsal and plantar Circulation: Pulses normal right and left pedal and posterial tibial  Lesions: none  Results for orders placed or performed in visit on 03/14/20  POCT glycosylated hemoglobin (Hb A1C)  Result Value Ref Range   Hemoglobin A1C     HbA1c POC (<> result, manual entry)     HbA1c, POC (prediabetic range) 6.3 5.7 - 6.4 %   HbA1c, POC (controlled diabetic range)    Glucose (CBG)  Result Value Ref Range   POC Glucose 92 70 - 99 mg/dl    ASSESSMENT AND PLAN: 1. Type 2 diabetes mellitus without complication, without long-term current use of insulin ([place]): -Diabetes controlled. A1C at goal. -Continue Metformin and Atorvastatin as prescribed.  -BMP to evaluate kidney function, liver function, and electrolyte balance. -Referral to Ophthalmology for diabetic eye examination.  -To achieve an A1C goal of less than or equal to 7.0 percent, a fasting blood sugar of 80 to 130 mg/dL and a postprandial glucose (90 to 120 minutes after a meal) less than 180 mg/dL. In the event of sugars less than 60 mg/dl or greater than 400 mg/dl please notify the clinic ASAP. It is recommended that you undergo annual eye exams and annual  foot exams. -Discussed the importance of healthy eating habits, low-carbohydrate diet, low-sugar diet, regular aerobic exercise (at least 150 minutes a week as tolerated) and medication compliance to achieve or maintain control of diabetes. -Follow-up with primary physician in 3 months or sooner if needed for management of chronic condition. - POCT glycosylated hemoglobin (Hb A1C) - Glucose (CBG) - Basic Metabolic Panel - Ambulatory referral to Ophthalmology - atorvastatin (LIPITOR) 20 MG tablet; Take 1 tablet (20 mg total) by mouth daily. To lower cholesterol  Dispense: 90 tablet; Refill: 0 - metFORMIN (GLUCOPHAGE) 500 MG tablet; Take 1 tablet (500 mg total) by mouth 2 (two) times daily with a meal. For 1 week then increase to 2 pills twice daily after meal  Dispense: 180 tablet; Refill: 0  2. Mixed dyslipidemia: -Continue low-fat, low-sodium, heart healthy diet and at least 30 minutes of moderate intensity exercise as tolerated daily.  -Continue Atorvastatin as prescribed.  -Lipid panel to evaluate cholesterol level. - atorvastatin (LIPITOR) 20 MG tablet; Take 1 tablet (20 mg total) by mouth daily. To lower cholesterol  Dispense: 90 tablet; Refill: 0 - Lipid Panel  3. Muscle spasm of back: -Patient still reports muscle spasms of upper back with back pain. -Continue over-the-counter nonsteroidal anti-inflammatory medication or Acetaminophen as needed or pain. -Cyclobenzaprine to help with spasm of upper back.  -Muscle relaxants may cause drowsiness.  -Counseled patient to not consume if operating heavy machinery or driving.  -Counseled patient to not consume with alcohol. -Follow-up with primary physician as needed. - cyclobenzaprine (FLEXERIL) 5 MG tablet; Take 1 tablet (5 mg total) by mouth 3 (three) times daily as needed for muscle spasms.  Dispense: 60 tablet; Refill: 0  4. Language barrier: English as a second language teacher participated during this visit. Interpreter Name: [place], [place]:  [phone] .   Patient was given the opportunity to ask questions.  Patient verbalized understanding of the plan and was able to repeat key elements of the plan. Patient was given clear instructions to go to Emergency Department or return to medical center if symptoms don't improve, worsen, or new  problems develop.The patient verbalized understanding.  Camillia Herter, NP

## 2020-03-14 ENCOUNTER — Encounter: Payer: Self-pay | Admitting: Family

## 2020-03-14 ENCOUNTER — Other Ambulatory Visit: Payer: Self-pay

## 2020-03-14 ENCOUNTER — Ambulatory Visit: Payer: Self-pay | Attending: Family | Admitting: Family

## 2020-03-14 VITALS — BP 119/79 | HR 69 | Temp 97.3°F | Resp 16 | Wt 182.4 lb

## 2020-03-14 DIAGNOSIS — M62830 Muscle spasm of back: Secondary | ICD-10-CM

## 2020-03-14 DIAGNOSIS — E782 Mixed hyperlipidemia: Secondary | ICD-10-CM

## 2020-03-14 DIAGNOSIS — Z789 Other specified health status: Secondary | ICD-10-CM

## 2020-03-14 DIAGNOSIS — E119 Type 2 diabetes mellitus without complications: Secondary | ICD-10-CM

## 2020-03-14 LAB — POCT GLYCOSYLATED HEMOGLOBIN (HGB A1C): HbA1c, POC (prediabetic range): 6.3 % (ref 5.7–6.4)

## 2020-03-14 LAB — GLUCOSE, POCT (MANUAL RESULT ENTRY): POC Glucose: 92 mg/dl (ref 70–99)

## 2020-03-14 MED ORDER — CYCLOBENZAPRINE HCL 5 MG PO TABS: 5.0000 mg | ORAL_TABLET | Freq: Three times a day (TID) | ORAL | 0 refills | Status: DC | PRN

## 2020-03-14 MED ORDER — ATORVASTATIN CALCIUM 20 MG PO TABS: 20.0000 mg | ORAL_TABLET | Freq: Every day | ORAL | 0 refills | Status: DC

## 2020-03-14 MED ORDER — METFORMIN HCL 500 MG PO TABS: 500.0000 mg | ORAL_TABLET | Freq: Two times a day (BID) | ORAL | 0 refills | Status: DC

## 2020-03-14 MED FILL — ?ATORVASTATIN 20 MG TABLET: 20 | 30 days supply | Qty: 30 | Fill #0

## 2020-03-14 NOTE — Patient Instructions (Addendum)
Continue Metformin and Atorvastatin as prescribed. Lab today. Flexeril for back spasms. Follow-up with primary physician in 3 months or sooner if needed. Informacin bsica sobre la diabetes Diabetes Basics  La diabetes (diabetes mellitus) es una enfermedad de larga duracin (crnica). Se produce cuando el cuerpo no utiliza Occupational hygienist (glucosa) que se libera de los alimentos despus de comer. La diabetes puede deberse a uno de [employer] o a ambos:  El pncreas no produce suficiente cantidad de una hormona llamada insulina.  El cuerpo no reacciona de forma normal a la insulina que produce. La insulina permite que ciertos azcares (glucosa) ingresen a las clulas del cuerpo. Esto le proporciona energa. Si tiene diabetes, los azcares no pueden ingresar a las clulas. Esto produce un aumento del nivel de Dispensing optician (hiperglucemia). Sigue estas instrucciones en tu casa: Cmo se trata la diabetes? Es posible que tenga que administrarse insulina u otros medicamentos para la diabetes todos los [place] para mantener el nivel de Location manager en la sangre equilibrado. Adminstrese los medicamentos para la diabetes todos los [place] se lo haya indicado el mdico. Haga una lista de los medicamentos para la diabetes aqu: Medicamentos para la diabetes  Nombre del medicamento: ______________________________ ? Cantidad (dosis): ________________ Mellody Drown (a.m./p.m.): _______________ Wilford Grist: ___________________________________  Micki Riley medicamento: ______________________________ ? Cantidad (dosis): ________________ Mellody Drown (a.m./p.m.): _______________ Wilford Grist: ___________________________________  Micki Riley medicamento: ______________________________ ? Cantidad (dosis): ________________ Mellody Drown (a.m./p.m.): _______________ Wilford Grist: ___________________________________ [place] insulina, aprender cmo aplicrsela con inyecciones. Es posible que deba ajustar la cantidad en funcin de los  alimentos que coma. Haga una lista de los tipos de [place] aqu: Insulina  Tipo de insulina: ______________________________ ? Cantidad (dosis): ________________ Mellody Drown (a.m./p.m.): _______________ Wilford Grist: ___________________________________  Suezanne Jacquet: ______________________________ ? Cantidad (dosis): ________________ Mellody Drown (a.m./p.m.): _______________ Wilford Grist: ___________________________________  Suezanne Jacquet: ______________________________ ? Cantidad (dosis): ________________ Mellody Drown (a.m./p.m.): _______________ Wilford Grist: ___________________________________  Suezanne Jacquet: ______________________________ ? Cantidad (dosis): ________________ Mellody Drown (a.m./p.m.): _______________ Wilford Grist: ___________________________________  Suezanne Jacquet: ______________________________ ? Cantidad (dosis): ________________ Mellody Drown (a.m./p.m.): _______________ Wilford Grist: ___________________________________ Cmo me controlo el nivel de azcar en la sangre?  Controle sus niveles de azcar en la sangre con un medidor de glucemia segn las indicaciones del mdico. El mdico fijar los objetivos del tratamiento para usted. Generalmente, los resultados de los niveles de azcar en la sangre deben ser los siguientes:  Antes de las comidas (preprandial): de 80 a 130mg /dl (de 4,4 a 7,16mmol/l).  Despus de las comidas (posprandial): por debajo de 180mg /dl (69mmol/l).  Nivel de A1c: menos del 7%. Anote las veces que se controlar los niveles de azcar en la sangre: Controles de azcar en la sangre  Hora: _______________ Wilford Grist: ___________________________________  Mellody Drown: _______________ Notas: ___________________________________  Hora: _______________ Notas: ___________________________________  Hora: _______________ Notas: ___________________________________  Hora: _______________ Notas: ___________________________________  Hora: _______________ Notas:  ___________________________________  Sander Nephew debo saber acerca del nivel bajo de azcar en la sangre? Un nivel bajo de azcar en la sangre se denomina hipoglucemia. Este cuadro ocurre cuando el nivel de azcar en la sangre es igual o menor que 70mg /dl (3,61mmol/l). Entre los sntomas, se pueden incluir los siguientes:  Sentir: ? [place]. ? Preocupacin o nervios (ansiedad). ? Sudoracin y [employer]. ? Confusin. ? Mareos. ? Somnolencia. ? Ganas de vomitar (nuseas).  Tener: ? Latidos cardacos acelerados. ? Dolor de [place]. ? Cambios en la visin. ? Hormigueo y falta de sensibilidad (entumecimiento) alrededor de la boca, los labios o la [place]. ? Movimientos espasmdicos que no puede  controlar (convulsiones).  Dificultades para hacer lo siguiente: ? Moverse (coordinacin). ? Dormir. ? Desmayos. ? Molestarse con facilidad (irritabilidad). Tratamiento del nivel bajo de azcar en la sangre Para tratar un nivel bajo de azcar en la sangre, ingiera un alimento o una bebida azucarada de inmediato. Si puede pensar con claridad y tragar de manera segura, siga la regla 15/15, que consiste en lo siguiente:  Consuma 15gramos de un hidrato de carbono de accin rpida (carbohidrato). Hable con su mdico acerca de cunto debera consumir.  Algunos hidratos de carbono de accin rpida son: ? Comprimidos de azcar (pastillas de glucosa). Consuma 3o 4pastillas de glucosa. ? De 6 a 8unidades de caramelos duros. ? De 4 a 6onzas (de 120 a 171ml) de jugo de frutas. ? De 4 a 6onzas (de 120 a 142ml) de refresco comn (no diettico). ? 1 cucharada (23ml) de miel o azcar.  Contrlese el nivel de azcar en la sangre 61minutos despus de ingerir el hidrato de carbono.  Si el nivel de azcar en la sangre todava es igual o menor que 70mg /dl (3,70mmol/l), ingiera nuevamente 15gramos de un hidrato de carbono.  Si el nivel de azcar en la sangre no supera los 70mg /dl (3,52mmol/l) despus de  3intentos, solicite ayuda de inmediato.  Ingiera una comida o una colacin en el transcurso de 1hora despus de que el nivel de azcar en la sangre se haya normalizado. Tratamiento del nivel muy bajo de azcar en la sangre Si el nivel de azcar en la sangre es igual o menor que 54mg /dl (79mmol/l), significa que est muy bajo (hipoglucemia grave). Esto es Engineer, maintenance (IT). No espere a ver si los sntomas desaparecen. Solicite atencin mdica de inmediato. Comunquese con el servicio de emergencias de su localidad (911 en los [place]). No conduzca por sus propios medios [employer] hospital. Preguntas para hacerle al mdico  Es necesario que me rena con Radio broadcast assistant en el cuidado de la diabetes?  Qu equipos necesitar para cuidarme en casa?  Qu medicamentos para la diabetes necesito? Cundo debo tomarlos?  Con qu frecuencia debo controlar mi nivel de azcar en la sangre?  A qu nmero puedo llamar si tengo preguntas?  Cundo es mi prxima cita con el mdico?  Dnde puedo encontrar un grupo de apoyo para las personas con diabetes? Dnde buscar ms informacin  American Diabetes Association (Asociacin Estadounidense de la Diabetes): www.diabetes.org  American Association of Diabetes Educators (Asociacin Estadounidense de Instructores para el Cuidado de la Diabetes): www.diabeteseducator.org/patient-resources Comunquese con un mdico si:  El nivel de azcar en la sangre es igual o mayor que 240mg /dl (13,30mmol/dl) durante 2das seguidos.  Ha estado enfermo o ha tenido fiebre durante 2das o ms y no mejora.  Tiene alguno de estos problemas durante ms de 6horas: ? No puede comer ni beber. ? Siente malestar estomacal (nuseas). ? Vomita. ? Presenta heces lquidas (diarrea). Solicite ayuda inmediatamente si:  El nivel de azcar en la sangre est por debajo de 54mg /dl (27mmol/l).  Se siente confundida.  Tiene dificultad para hacer lo siguiente: ? Pensar  con claridad. ? La respiracin. Resumen  La diabetes (diabetes mellitus) es una enfermedad de larga duracin (crnica). Se produce cuando el cuerpo no utiliza Occupational hygienist (glucosa) que se libera de los alimentos despus de la digestin.  Aplquese la insulina y tome los medicamentos para la diabetes como se lo hayan indicado.  Contrlese el nivel de azcar en la sangre todos los [place], con la frecuencia que le hayan indicado.  Concurra  a todas las visitas de seguimiento como se lo haya indicado el mdico. Esto es importante. Esta informacin no tiene Marine scientist el consejo del mdico. Asegrese de hacerle al mdico cualquier pregunta que tenga. Document Revised: 11/15/2018 Document Reviewed: 01/27/2018 Elsevier Patient Education  [place].

## 2020-03-15 LAB — LIPID PANEL
Chol/HDL Ratio: 2.9 ratio (ref 0.0–4.4)
Cholesterol, Total: 165 mg/dL (ref 100–199)
HDL: 57 mg/dL (ref >39)
LDL Chol Calc (NIH): 77 mg/dL (ref 0–99)
Triglycerides: 186 mg/dL — ABNORMAL HIGH (ref 0–149)
VLDL Cholesterol Cal: 31 mg/dL (ref 5–40)

## 2020-03-15 LAB — BASIC METABOLIC PANEL
BUN/Creatinine Ratio: 24 — ABNORMAL HIGH (ref 9–23)
BUN: 13 mg/dL (ref 6–24)
CO2: 24 mmol/L (ref 20–29)
Calcium: 10.3 mg/dL — ABNORMAL HIGH (ref 8.7–10.2)
Chloride: 101 mmol/L (ref 96–106)
Creatinine, Ser: 0.55 mg/dL — ABNORMAL LOW (ref 0.57–1.00)
GFR calc Af Amer: 125 mL/min/{1.73_m2} (ref >59)
GFR calc non Af Amer: 108 mL/min/{1.73_m2} (ref >59)
Glucose: 99 mg/dL (ref 65–99)
Potassium: 4.3 mmol/L (ref 3.5–5.2)
Sodium: 138 mmol/L (ref 134–144)

## 2020-03-17 NOTE — Progress Notes (Signed)
Please call patient with update.   Creatinine, which checks for kidney function, slightly low but about the same since last visit.   BUN/Creatinine ratio which checks for kidney function slightly high. Consistent management of diabetes may help with this.  Calcium slightly elevated. Monitor intake of calcium-rich foods such as milk, yogurt, and cheese.  Triglycerides which is a form of cholesterol is high but decreased from last year. High triglycerides may increase risk for cardiovascular events such as heart attack and stroke. Continue Atorvastatin, low-fat diet, and exercise as tolerated. May recheck this at next visit.  A1C and CBG discussed in clinic.

## 2020-03-21 ENCOUNTER — Telehealth: Payer: Self-pay

## 2020-03-21 NOTE — Telephone Encounter (Signed)
[place] interpreters Donald Prose  Id#  [id number] contacted pt to go over lab results pt didn't answer left a detailed vm informing pt of results and if she has any questions or concerns to give a call

## 2020-03-25 ENCOUNTER — Ambulatory Visit: Payer: Self-pay | Admitting: *Deleted

## 2020-03-25 ENCOUNTER — Telehealth: Payer: Self-pay | Admitting: Family Medicine

## 2020-03-25 ENCOUNTER — Other Ambulatory Visit: Payer: Self-pay | Admitting: *Deleted

## 2020-03-25 ENCOUNTER — Other Ambulatory Visit: Payer: Self-pay

## 2020-03-25 ENCOUNTER — Ambulatory Visit: Payer: Self-pay

## 2020-03-25 VITALS — BP 112/63 | Temp 97.5°F | Wt 183.3 lb

## 2020-03-25 DIAGNOSIS — N644 Mastodynia: Secondary | ICD-10-CM

## 2020-03-25 DIAGNOSIS — Z1239 Encounter for other screening for malignant neoplasm of breast: Secondary | ICD-10-CM

## 2020-03-25 NOTE — Telephone Encounter (Signed)
Called the PT LVM to inform her that the bills she left at the office won't be cover since her cone discount exp 12/03/19, she need to schedule a financial appt and bring the application well the documentation

## 2020-03-25 NOTE — Patient Instructions (Addendum)
Informed Trang Ogle about breast self-awareness and gave educational materials to take home. Patient did not need a Pap smear today due to last Pap smear was in 11/27/2018 per patient. Referred patient to the [place] for diagnostic mammogram. Appointment scheduled for March 25, 2020 at 10:30am. Patient aware of appointment and will be there. Jesusa Barbuto verbalized understanding.  Vania Rea, RN, FNP student 10:15 AM

## 2020-03-25 NOTE — Progress Notes (Signed)
[name] is a 54 y.o. female who presents to [facility] clinic today with complaints of bilateral nipple pain for 12 days. Patient states the pain comes and goes. Patient rates the pain at a 6 out of 10.    Pap Smear: Pap not smear completed today. Last Pap smear was 06/22/2019 at [facility] clinic and was negative, only showing yeast. Per patient she has a history of an abnormal Pap smear 14 years ago with follow up colposcopy. She states that all Pap smears have been negative since then. Per patient has had at least three normal Pap smears since colposcopy. Last two Pap smear results are available Epic.   Physical exam: Breasts Breasts symmetrical. No skin abnormalities bilateral breasts. No nipple retraction bilateral breasts. No nipple discharge bilateral breasts. No lymphadenopathy. No lumps palpated bilateral breasts. Patient complains of bilateral nipple pain for 12 days. She says the pain is worse when her nipples are hard or are touched. Patient states that bras increase her nipple pain.      Pelvic/Bimanual Pap is not indicated today.    Smoking History: Patient has never smoked.  Patient Navigation: Patient education provided. Access to services provided for patient through [place] program. Rudene Anda, Spanish interpreter from [facility] provided.   Colorectal Cancer Screening: Per patient she has not had a colonoscopy performed. She did FIT testing on 12/26/2018 which was negative. No complaints today.    Breast and Cervical Cancer Risk Assessment: Patient does not have a family history of breast cancer, known genetic mutations, or radiation treatment to the chest before age 36. Patient does not have a history of cervical dysplasia, immunocompromised, or DES exposure in-utero.  Risk Assessment   No risk assessment data for the current encounter  Risk Scores      11/07/2018   Last edited by: Loletta Parish, RN   5-year risk: 0.8 %   Lifetime risk: 7.7 %          A: B[facility]CP exam without pap smear Complains of bilateral nipple pain without discharge.  P: Referred patient to the [facility] for a diagnostic mammogram. Appointment scheduled 03/25/2020 at 10:30am.  Vania Rea, RN, FNP student 03/25/2020 9:56 AM   Attestation of Supervision of Student:  I confirm that I have verified the information documented in the nurse practitioner student's note and that I have also personally reperformed the history, physical exam and all medical decision making activities.  I have verified that all services and findings are accurately documented in this student's note; and I agree with management and plan as outlined in the documentation. I have also made any necessary editorial changes.  [place], [place] for [employer], [place] [employer] 03/25/2020 12:41 PM

## 2020-03-27 ENCOUNTER — Ambulatory Visit: Payer: Self-pay

## 2020-03-27 ENCOUNTER — Ambulatory Visit
Admission: RE | Admit: 2020-03-27 | Discharge: 2020-03-27 | Disposition: A | Discharge status: Home / Self Care | Payer: No Typology Code available for payment source | Source: Ambulatory Visit | Attending: Obstetrics and Gynecology | Admitting: Obstetrics and Gynecology

## 2020-03-27 ENCOUNTER — Other Ambulatory Visit: Payer: Self-pay

## 2020-03-27 DIAGNOSIS — N644 Mastodynia: Secondary | ICD-10-CM

## 2020-04-03 ENCOUNTER — Telehealth: Payer: Self-pay

## 2020-04-03 NOTE — Telephone Encounter (Signed)
Pt. Called back for lab results. Given with Spanish interpreter, Christian with [facility]. Verbalizes understanding. Requesting a referral to OB/GYN. Also pt. Thought she needed referral for "my kidneys and liver." Please advise.

## 2020-04-04 NOTE — Telephone Encounter (Signed)
Can you please find out the indication for referral so this can be placed in the notes? Thanks.

## 2020-04-04 NOTE — Telephone Encounter (Signed)
Needs referral to OBGYN

## 2020-04-09 NOTE — Telephone Encounter (Signed)
Pt has not seen GYN since giving birth years ago. Pt request GYN referral.

## 2020-04-09 NOTE — Telephone Encounter (Signed)
Appt made for 04/18/2020 in office visit . Made pt aware

## 2020-04-09 NOTE — Telephone Encounter (Signed)
Please schedule an appointment with any available clinician to discuss this.  Thank you

## 2020-04-18 ENCOUNTER — Ambulatory Visit: Payer: Self-pay | Attending: Family | Admitting: Family

## 2020-04-18 ENCOUNTER — Other Ambulatory Visit: Payer: Self-pay

## 2020-04-18 VITALS — BP 109/72 | HR 61 | Ht 62.0 in | Wt 183.8 lb

## 2020-04-18 DIAGNOSIS — N9489 Other specified conditions associated with female genital organs and menstrual cycle: Secondary | ICD-10-CM

## 2020-04-18 DIAGNOSIS — Z789 Other specified health status: Secondary | ICD-10-CM

## 2020-04-18 MED FILL — ?ATORVASTATIN 20 MG TABLET: 20 | 30 days supply | Qty: 30 | Fill #1

## 2020-04-18 MED FILL — TRUE METRIX TEST STRIP: 33 days supply | Qty: 100 | Fill #2

## 2020-04-18 MED FILL — TRUEplus LANCETS 28G MISC: 30 days supply | Qty: 100 | Fill #2

## 2020-04-18 MED FILL — METFORMIN HCL 500 MG TABS: 500 | 15 days supply | Qty: 60 | Fill #1

## 2020-04-18 NOTE — Patient Instructions (Addendum)
Derivacin a ginecologa.  Referral to gynecology.   La menopausia Menopause La menopausia es el perodo normal de la vida en el que los perodos menstruales cesan por completo. Por lo general, se confirma tras 5meses de no haber tenido un perodo menstrual. La transicin a la menopausia (perimenopausia), con mayor frecuencia, ocurre entre los 45 y los 55aos. Durante la perimenopausia, los niveles hormonales cambian en el [place], lo que puede provocar sntomas y Psychologist, occupational. La menopausia podra aumentar el riesgo de sufrir lo siguiente:  Prdida de masa sea (osteoporosis), lo que predispone a la rotura de huesos (fracturas).  Depresin.  Endurecimiento y Public librarian de las arterias (aterosclerosis), lo que puede causar infartos de miocardio y accidentes cerebrovasculares. Cules son las causas? A menudo, la causa de esta afeccin es un cambio natural en los niveles hormonales, que ocurre a medida que envejece. La afeccin tambin podra ser provocada por una ciruga en la que se extraigan ambos ovarios (ooforectoma bilateral). Qu incrementa el riesgo? Es ms probable que esta afeccin comience a una temprana edad si tiene ciertas afecciones o se realiza ciertos tratamientos, incluidos los siguientes:  Un tumor en la hipfisis del cerebro.  Una enfermedad que afecte los ovarios y la produccin de hormonas.  Radioterapia para tratar Science writer.  Ciertos tratamientos contra el cncer, como quimioterapia o terapia hormonal (antiestrgeno).  Fumar mucho y consumir alcohol de forma excesiva.  Antecedentes familiares de menopausia temprana. Adems, es ms probable que esta afeccin se presente de manera temprana en las mujeres que son [place]. Cules son los signos o los sntomas? Los sntomas de esta afeccin incluyen los siguientes:  Nurse, learning disability.  Perodos menstruales irregulares.  Sudoracin nocturna.  Cambios en el sentimiento respecto de las [place]. Es posible que el deseo sexual disminuya o que se sienta ms cmoda respecto de su sexualidad.  Sequedad vaginal y adelgazamiento de las paredes de la vagina. Esto podra hacer que sienta dolor durante las relaciones sexuales.  Sequedad de la piel y aparicin de Glass blower/designer.  Dolores de [place].  Problemas para dormir (insomnio).  Cambios de humor o irritabilidad.  Problemas de memoria.  Aumento de [place].  Crecimiento de bello en la cara y el pecho.  Infecciones en la vejiga o problemas para orinar. Cmo se diagnostica? Esta afeccin se diagnostica en funcin de los antecedentes mdicos, un examen fsico, la edad, los antecedentes menstruales y los sntomas. Tambin le podran realizar estudios hormonales. Cmo se trata? En algunos los casos, no se necesita tratamiento. Usted y el mdico deben decidir juntos si se debe Arts administrator. El tratamiento se determinar en funcin de su cuadro clnico y de sus preferencias. El tratamiento de este cuadro clnico se centra en el control de los sntomas. El tratamiento puede incluir lo siguiente:  Terapia hormonal para la menopausia.  Medicamentos para tratar sntomas o complicaciones especficos.  Acupuntura.  Vitaminas o suplementos herbales. Antes de Biochemist, clinical, es importante que le avise al mdico si tiene antecedentes personales o familiares de lo siguiente:  Enfermedades cardacas.  Cncer de mama.  Cogulos de [place]eltzer.  Diabetes.  Osteoporosis. Siga estas indicaciones en su casa: Estilo de vida  No consuma ningn producto que contenga nicotina o tabaco, como cigarrillos y Psychologist, sport and exercise. Si necesita ayuda para dejar de fumar, consulte al mdico.  Realice, por lo menos, [id number] de actividad fsica 5das por semana o ms.  Evite las bebidas con alcohol o cafena, as como las [employer]. Esto podra ayudar  a Librarian, academic.  Intente dormir de 7 a  8horas todas las noches.  Si tiene acaloramientos: ? Vstase en capas. ? Evite las cosas que podran [employer] acaloramientos, como las comidas muy condimentadas, los lugares calientes o el estrs. ? Respire profundamente y despacio cuando comience a Chief Executive Officer. ? Tenga un ventilador en su casa y en su oficina.  Encuentre modos de [employer], por ejemplo, a travs de la respiracin, meditacin o un diario ntimo.  Considere la posibilidad de asistir a terapia grupal con otras mujeres que tengan sntomas de [place]. [place]dale recomendaciones al [employer] reuniones de terapia grupal. Comida y bebida  Siga una dieta saludable y equilibrada que incluya cereales integrales, protenas magras, productos lcteos descremados y [place] frutas y verduras.  El mdico podra recomendarle que agregue una mayor cantidad de soja a su dieta. Algunos de los alimentos que contienen soja son el tofu, el tempeh y [place].  Consuma muchos alimentos que contengan calcio y vitaminaD para Engineer, manufacturing systems salud sea. Algunos productos que contienen mucho calcio son los [employer], el yogur, los frijoles, las [place], las sardinas, el brcoli y la col rizada. Medicamentos  Delphi de venta libre y los recetados solamente como se lo haya indicado el mdico.  Hable con el mdico antes de Art gallery manager a tomar cualquier suplemento herbal. [place] vitaminas y los suplementos recetados como se lo haya indicado el mdico. Estos pueden incluir lo siguiente: ? Calcio. Las mujeres de 51aos o ms deben consumir 1200mg  (miligramos) de [employer]. ? VitaminaD. Las mujeres necesitan entre 600 y 800unidades internacionales de [place]. ? VitaminasB12 yB6. Procure consumir 71microgramos de vitaminaB12 y 1,5mg  de vitaminaB6 todos los das. Instrucciones generales  Lleve un registro de sus perodos menstruales; incluya lo siguiente: ? El  momento en que ocurren. ? Qu tan abundantes son y cunto duran. ? Cunto tiempo transcurre entre cada perodo menstrual.  Lleve un registro de los sntomas; anote cundo comienzan, con qu frecuencia ocurren y cunto duran.  Use lubricantes o humectantes vaginales para aliviar la sequedad vaginal y Research officer, political party [place].  Concurra a todas las visitas de control como se lo haya indicado el mdico. Esto es importante. Esto incluye la terapia grupal y la psicoterapia. Comunquese con un mdico si:  An tiene perodos menstruales despus de los 55aos.  Siente dolor durante las [employer].  No tuvo perodos menstruales durante los ltimos 42meses y presenta sangrado vaginal. Solicite ayuda de inmediato si:  Tiene los siguientes sntomas: ? Depresin grave. ? Sangrado vaginal excesivo. ? Dolor al [employer]. ? Latidos cardacos rpidos o irregulares (palpitaciones). ? Dolor de cabeza intenso. ? Dolor en el abdomen (abdominal) o dispepsia grave.  Se cay y cree que se ha fracturado un hueso.  Siente dolor en el pecho o en la pierna.  Desarrolla problemas de visin.  Se toca un bulto en la mama. Resumen  La menopausia es el perodo normal de la vida en el que los perodos menstruales cesan por completo. Por lo general, se confirma tras 60meses de no haber tenido un perodo menstrual.  La transicin a la menopausia (perimenopausia), con mayor frecuencia, ocurre entre los 64 y los 55aos.  Los sntomas pueden controlarse mediante medicamentos, cambios en el estilo de vida y terapias complementarias, como acupuntura.  Siga una dieta equilibrada que incluya muchos nutrientes para Psychologist, counselling salud sea y la salud cardaca, y para Arts development officer  sntomas durante la menopausia. Esta informacin no tiene Marine scientist el consejo del mdico. Asegrese de hacerle al mdico cualquier pregunta que tenga. Document Revised: 06/16/2017 Document  Reviewed: 06/16/2017 Elsevier Patient Education  Deal.

## 2020-04-18 NOTE — Progress Notes (Signed)
Needs referral to [facility] for pain in her ovaries.

## 2020-04-18 NOTE — Progress Notes (Signed)
Patient ID: Elaine Burch, female    DOB: 1966-03-24  MRN: [id number]  CC: Referral  Subjective: Elaine Burch is a 54 y.o. female with history of muscle spasm of back and visual changes who presents for Referral.   1. PAIN: Location: bilateral lower stomach Radiation: bilateral low back Duration: Longer than 1 year Rating: 7-8/10, does not happen daily, reports feeling bloated, reports this as intermittently with the last time being 3 weeks ago Associated Symptoms: denies What makes it better: none  What makes it worse: during intercourse  Medications and other therapies tried: none LMP: June 2021 and lasted about 4 days. Reports her periods have become irregular. Last PAP: 06/22/2019 negative for lesions and cancer Requests referral to gynecology as she thinks the pain is related to her ovaries.  Patient Active Problem List   Diagnosis Date Noted  . Muscle spasm of back 11/24/2013  . Dental caries 11/24/2013  . Visual changes 11/24/2013     Current Outpatient Medications on File Prior to Visit  Medication Sig Dispense Refill  . atorvastatin (LIPITOR) 20 MG tablet Take 1 tablet (20 mg total) by mouth daily. To lower cholesterol 90 tablet 0  . Blood Glucose Monitoring Suppl (TRUE METRIX METER) w/Device KIT Use to check blood sugars 2-3 times per day 1 kit 0  . cyclobenzaprine (FLEXERIL) 5 MG tablet Take 1 tablet (5 mg total) by mouth 3 (three) times daily as needed for muscle spasms. 60 tablet 0  . famotidine (PEPCID) 20 MG tablet Take 1 tablet (20 mg total) by mouth 2 (two) times daily. To protect stomach 180 tablet 3  . glucose blood (TRUE METRIX BLOOD GLUCOSE TEST) test strip Use as instructed to check blood sugar 2-3 times 100 each 12  . ibuprofen (ADVIL) 600 MG tablet Take 1 tablet (600 mg total) by mouth every 8 (eight) hours as needed for moderate pain. Take after eating 60 tablet 2  . metFORMIN (GLUCOPHAGE) 500 MG tablet Take 1 tablet (500 mg total) by  mouth 2 (two) times daily with a meal. For 1 week then increase to 2 pills twice daily after meal 180 tablet 0  . ondansetron (ZOFRAN) 4 MG tablet Take 1 tablet (4 mg total) by mouth every 8 (eight) hours as needed for nausea or vomiting. 20 tablet 3  . polyethylene glycol powder (GLYCOLAX/MIRALAX) 17 GM/SCOOP powder Use 17 gm scoop once per day mixed with fluid as needed to help with constipation 578 g 11  . traMADol (ULTRAM) 50 MG tablet Take 1-2 tablets (50-100 mg total) by mouth every 6 (six) hours as needed. 20 tablet 0  . TRUEplus Lancets 28G MISC Use when checking blood sugars 100 each 11   No current facility-administered medications on file prior to visit.    No Known Allergies  Social History   Socioeconomic History  . Marital status: Married    Spouse name: Not on file  . Number of children: 4  . Years of education: Not on file  . Highest education level: 6th grade  Occupational History  . Not on file  Tobacco Use  . Smoking status: Never Smoker  . Smokeless tobacco: Never Used  Vaping Use  . Vaping Use: Never used  Substance and Sexual Activity  . Alcohol use: No  . Drug use: No  . Sexual activity: Yes    Birth control/protection: None  Other Topics Concern  . Not on file  Social History Narrative  . Not on file   Social  Determinants of Health   Financial Resource Strain:   . Difficulty of Paying Living Expenses:   Food Insecurity:   . Worried About Charity fundraiser in the Last Year:   . Arboriculturist in the Last Year:   Transportation Needs: No Transportation Needs  . Lack of Transportation (Medical): No  . Lack of Transportation (Non-Medical): No  Physical Activity:   . Days of Exercise per Week:   . Minutes of Exercise per Session:   Stress:   . Feeling of Stress :   Social Connections:   . Frequency of Communication with Friends and Family:   . Frequency of Social Gatherings with Friends and Family:   . Attends Religious Services:   . Active  Member of Clubs or Organizations:   . Attends Archivist Meetings:   Marland Kitchen Marital Status:   Intimate Partner Violence:   . Fear of Current or Ex-Partner:   . Emotionally Abused:   Marland Kitchen Physically Abused:   . Sexually Abused:     Family History  Problem Relation Age of Onset  . Diabetes Mother   . Cancer Father   . Diabetes Brother     Past Surgical History:  Procedure Laterality Date  . NO PAST SURGERIES      ROS: Review of Systems Negative except as stated above  PHYSICAL EXAM: BP 109/72   Pulse 61   Ht _0  (1.575 m)   Wt 183 lb 12.8 oz (83.4 kg)   SpO2 98%   BMI 33.62 kg/m   Physical Exam General appearance - alert, well appearing, and in no distress and oriented to person, place, and time Mental status - alert, oriented to person, place, and time, normal mood, behavior, speech, dress, motor activity, and thought processes Neck - supple, no significant adenopathy Lymphatics - no palpable lymphadenopathy, no hepatosplenomegaly Chest - clear to auscultation, no wheezes, rales or rhonchi, symmetric air entry, no tachypnea, retractions or cyanosis Abdomen - soft, nontender, nondistended, no masses or organomegaly tenderness noted with palpation of left lumbar region  ASSESSMENT AND PLAN: 1. Pain of ovary: -Bilateral lower stomach pain for over 1 year. Described as intermittent pain 7-8/10. Last PAP in 2020 normal without lesions and malignancy. Reports still having a menstrual cycle but they have become irregular recently. Patient states she thinks the pain is related to her ovaries. -Counseled patient she may try over-the-counter Ibuprofen or Tylenol when having pain to see if this help. May also try using heating pads wrapped in towel to see if this helps. Irregular periods may be related to menopause. -Per patient request referral to Gynecology for further evaluation and management. - Ambulatory referral to Gynecology  2. Language barrier: Theatre manager participated during this visit. Interpreter Name:Jilverto, ID#: [id number].   Patient was given the opportunity to ask questions.  Patient verbalized understanding of the plan and was able to repeat key elements of the plan. Patient was given clear instructions to go to Emergency Department or return to medical center if symptoms don't improve, worsen, or new problems develop.The patient verbalized understanding.   Camillia Herter, NP

## 2020-05-12 ENCOUNTER — Other Ambulatory Visit: Payer: Self-pay

## 2020-05-12 ENCOUNTER — Ambulatory Visit: Payer: Self-pay

## 2020-05-19 ENCOUNTER — Other Ambulatory Visit: Payer: Self-pay | Admitting: Family

## 2020-05-19 DIAGNOSIS — E119 Type 2 diabetes mellitus without complications: Secondary | ICD-10-CM

## 2020-05-20 ENCOUNTER — Other Ambulatory Visit: Payer: Self-pay | Admitting: Family Medicine

## 2020-05-20 DIAGNOSIS — E119 Type 2 diabetes mellitus without complications: Secondary | ICD-10-CM

## 2020-05-20 MED ORDER — METFORMIN HCL 500 MG PO TABS: 500.0000 mg | ORAL_TABLET | Freq: Two times a day (BID) | ORAL | 0 refills | Status: DC

## 2020-05-20 MED FILL — ?METFORMIN HCL 500MG TABL: 500 | 30 days supply | Qty: 120 | Fill #0

## 2020-05-20 NOTE — Telephone Encounter (Signed)
Requested Prescriptions  Pending Prescriptions Disp Refills   metFORMIN (GLUCOPHAGE) 500 MG tablet 360 tablet 0    Sig: Take 1 tablet (500 mg total) by mouth 2 (two) times daily with a meal. For 1 week then increase to 2 pills twice daily after meal     Endocrinology:  Diabetes - Biguanides Failed - 05/20/2020 12:33 PM      Failed - Cr in normal range and within 360 days    Creat  Date Value Ref Range Status  11/26/2013 0.43 (L) 0.50 - 1.10 mg/dL Final   Creatinine, Ser  Date Value Ref Range Status  03/14/2020 0.55 (L) 0.57 - 1.00 mg/dL Final         Passed - HBA1C is between 0 and 7.9 and within 180 days    HbA1c, POC (prediabetic range)  Date Value Ref Range Status  03/14/2020 6.3 5.7 - 6.4 % Final         Passed - eGFR in normal range and within 360 days    GFR, Est African American  Date Value Ref Range Status  11/26/2013 >89 mL/min Final   GFR calc Af Amer  Date Value Ref Range Status  03/14/2020 125 >59 mL/min/1.73 Final    Comment:    **[facility] currently reports eGFR in compliance with the current**   recommendations of the [employer]. [facility] will   update reporting as new guidelines are published from the NKF-ASN   Task force.    GFR, Est Non African American  Date Value Ref Range Status  11/26/2013 >89 mL/min Final    Comment:      The estimated GFR is a calculation valid for adults (>=66 years old) that uses the CKD-EPI algorithm to adjust for age and sex. It is   not to be used for children, pregnant women, hospitalized patients,    patients on dialysis, or with rapidly changing kidney function. According to the NKDEP, eGFR >89 is normal, 60-89 shows mild impairment, 30-59 shows moderate impairment, 15-29 shows severe impairment and <15 is ESRD.     GFR calc non Af Amer  Date Value Ref Range Status  03/14/2020 108 >59 mL/min/1.73 Final         Passed - Valid encounter within last 6 months    Recent Outpatient Visits          1  month ago Pain of ovary   [place], [place] J, NP   2 months ago Type 2 diabetes mellitus without complication, without long-term current use of insulin ([place])   [place], Amy J, NP   7 months ago Type 2 diabetes mellitus without complication, without long-term current use of insulin ([place])   [place], [place], MD   8 months ago Type 2 diabetes mellitus without complication, without long-term current use of insulin [facility])   [place], Annie Main L, RPH-CPP   9 months ago Type 2 diabetes mellitus without complication, without long-term current use of insulin ([place])   [place] Antony Blackbird, MD      Future Appointments            In 3 weeks Ladell Pier, MD [place]

## 2020-05-20 NOTE — Telephone Encounter (Signed)
PT need a refill / shes out  metFORMIN (GLUCOPHAGE) 500 MG tablet [[id number]]  [place]  [place] [address]  Phone: [phone] Fax: [phone]

## 2020-06-02 MED FILL — ?ATORVASTATIN 20 MG TABLET: 20 | 30 days supply | Qty: 30 | Fill #2

## 2020-06-13 ENCOUNTER — Telehealth: Payer: Self-pay | Admitting: Family Medicine

## 2020-06-13 NOTE — Telephone Encounter (Signed)
We try to reach Pt, to inform her that her appt for Monday 06/16/20 at 2;10pm is by phone, she does not need to come to the clinic

## 2020-06-16 ENCOUNTER — Other Ambulatory Visit: Payer: Self-pay

## 2020-06-16 ENCOUNTER — Ambulatory Visit: Payer: Self-pay | Attending: Internal Medicine | Admitting: Internal Medicine

## 2020-06-16 DIAGNOSIS — Z23 Encounter for immunization: Secondary | ICD-10-CM

## 2020-06-16 DIAGNOSIS — E119 Type 2 diabetes mellitus without complications: Secondary | ICD-10-CM

## 2020-06-16 DIAGNOSIS — G8929 Other chronic pain: Secondary | ICD-10-CM

## 2020-06-16 DIAGNOSIS — M549 Dorsalgia, unspecified: Secondary | ICD-10-CM

## 2020-06-16 NOTE — Progress Notes (Signed)
Virtual Visit via Telephone Note Due to current restrictions/limitations of in-office visits due to the COVID-19 pandemic, this scheduled clinical appointment was converted to a telehealth visit  I connected with Elaine Burch on 06/16/20 at 2:41 p.m by telephone and verified that I am speaking with the correct person using two identifiers. I am in my office.  The patient is at home.  Only the patient, myself and Amy from [employer] participated in this encounter.  I discussed the limitations, risks, security and privacy concerns of performing an evaluation and management service by telephone and the availability of in person appointments. I also discussed with the patient that there may be a patient responsible charge related to this service. The patient expressed understanding and agreed to proceed.   History of Present Illness: Pt with hx of DM and HL.  PCP is Dr. Chapman Fitch.  DM:  Checks BS every morning.  Highest in the 130s.  Doing well with eating habits.  Walks 5 days a wk for 15-20 mins.  Needs RF on Lipitor  Main concern today:  C/o pain b/w shoulder blades for over 3 mths.  There all the time and intensity fluctuates.  No initiating factors.  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  Sometimes pain radiates up back and to RT arm.  Worse when she is doing house work like sweeping or standing to do dishes.  Better with back massage.   -takes a pain med given by [place] but she does not recall the name.  Not sure if Ibuprofen but it does help. Given Flexeril 03/2020.  Caused dizziness so she no longer takes it.    HM:  Needs flu shot  Outpatient Encounter Medications as of 06/16/2020  Medication Sig  . atorvastatin (LIPITOR) 20 MG tablet Take 1 tablet (20 mg total) by mouth daily. To lower cholesterol  . Blood Glucose Monitoring Suppl (TRUE METRIX METER) w/Device KIT Use to check blood sugars 2-3 times per day  . cyclobenzaprine (FLEXERIL) 5 MG tablet Take  1 tablet (5 mg total) by mouth 3 (three) times daily as needed for muscle spasms.  . famotidine (PEPCID) 20 MG tablet Take 1 tablet (20 mg total) by mouth 2 (two) times daily. To protect stomach  . glucose blood (TRUE METRIX BLOOD GLUCOSE TEST) test strip Use as instructed to check blood sugar 2-3 times  . ibuprofen (ADVIL) 600 MG tablet Take 1 tablet (600 mg total) by mouth every 8 (eight) hours as needed for moderate pain. Take after eating  . metFORMIN (GLUCOPHAGE) 500 MG tablet Take 1 tablet (500 mg total) by mouth 2 (two) times daily with a meal. For 1 week then increase to 2 pills twice daily after meal  . ondansetron (ZOFRAN) 4 MG tablet Take 1 tablet (4 mg total) by mouth every 8 (eight) hours as needed for nausea or vomiting.  . polyethylene glycol powder (GLYCOLAX/MIRALAX) 17 GM/SCOOP powder Use 17 gm scoop once per day mixed with fluid as needed to help with constipation  . traMADol (ULTRAM) 50 MG tablet Take 1-2 tablets (50-100 mg total) by mouth every 6 (six) hours as needed.  . TRUEplus Lancets 28G MISC Use when checking blood sugars   No facility-administered encounter medications on file as of 06/16/2020.    Observations/Objective: No direct observation done Results for orders placed or performed in visit on 92/11/94  Basic Metabolic Panel  Result Value Ref Range   Glucose 99 65 - 99 mg/dL  BUN 13 6 - 24 mg/dL   Creatinine, Ser 0.55 (L) 0.57 - 1.00 mg/dL   GFR calc non Af Amer 108 >59 mL/min/1.73   GFR calc Af Amer 125 >59 mL/min/1.73   BUN/Creatinine Ratio 24 (H) 9 - 23   Sodium 138 134 - 144 mmol/L   Potassium 4.3 3.5 - 5.2 mmol/L   Chloride 101 96 - 106 mmol/L   CO2 24 20 - 29 mmol/L   Calcium 10.3 (H) 8.7 - 10.2 mg/dL  Lipid Panel  Result Value Ref Range   Cholesterol, Total 165 100 - 199 mg/dL   Triglycerides 186 (H) 0 - 149 mg/dL   HDL 57 >39 mg/dL   VLDL Cholesterol Cal 31 5 - 40 mg/dL   LDL Chol Calc (NIH) 77 0 - 99 mg/dL   Chol/HDL Ratio 2.9 0.0 - 4.4  ratio  POCT glycosylated hemoglobin (Hb A1C)  Result Value Ref Range   Hemoglobin A1C     HbA1c POC (<> result, manual entry)     HbA1c, POC (prediabetic range) 6.3 5.7 - 6.4 %   HbA1c, POC (controlled diabetic range)    Glucose (CBG)  Result Value Ref Range   POC Glucose 92 70 - 99 mg/dl    Assessment and Plan: 1. Type 2 diabetes mellitus without complication, without long-term current use of insulin (HCC) Continue Metformin, healthy eating habits and regular exercise.  2. Upper back pain, chronic Sounds MSK in nature.  I recommend taking ibuprofen as needed and purchasing some icy hot rub over-the-counter also to use as needed.  Using heating pad at the end of her workday will probably help.  If no improvement in a few weeks she should follow-up with her PCP Dr. Chapman Fitch.  3. Need for influenza vaccination Will come in tomorrow to see the clinical pharmacist to get her flu shot.   Follow Up Instructions: 2 months with Dr. Chapman Fitch.   I discussed the assessment and treatment plan with the patient. The patient was provided an opportunity to ask questions and all were answered. The patient agreed with the plan and demonstrated an understanding of the instructions.   The patient was advised to call back or seek an in-person evaluation if the symptoms worsen or if the condition fails to improve as anticipated.  I provided 20 minutes of non-face-to-face time during this encounter.   Karle Plumber, MD

## 2020-06-19 ENCOUNTER — Ambulatory Visit (INDEPENDENT_AMBULATORY_CARE_PROVIDER_SITE_OTHER): Payer: Self-pay | Admitting: Obstetrics and Gynecology

## 2020-06-19 ENCOUNTER — Other Ambulatory Visit (HOSPITAL_COMMUNITY)
Admission: RE | Admit: 2020-06-19 | Discharge: 2020-06-19 | Disposition: A | Discharge status: Home / Self Care | Payer: No Typology Code available for payment source | Source: Ambulatory Visit | Attending: Obstetrics and Gynecology | Admitting: Obstetrics and Gynecology

## 2020-06-19 ENCOUNTER — Encounter: Payer: Self-pay | Admitting: Obstetrics and Gynecology

## 2020-06-19 ENCOUNTER — Other Ambulatory Visit: Payer: Self-pay

## 2020-06-19 VITALS — BP 107/72 | HR 67 | Wt 182.7 lb

## 2020-06-19 DIAGNOSIS — R102 Pelvic and perineal pain: Secondary | ICD-10-CM

## 2020-06-19 DIAGNOSIS — R1030 Lower abdominal pain, unspecified: Secondary | ICD-10-CM

## 2020-06-19 LAB — POCT URINALYSIS DIP (DEVICE)
Bilirubin Urine: NEGATIVE
Glucose, UA: NEGATIVE mg/dL
Ketones, ur: NEGATIVE mg/dL
Leukocytes,Ua: NEGATIVE
Nitrite: NEGATIVE
Protein, ur: NEGATIVE mg/dL
Specific Gravity, Urine: >=1.03 (ref 1.005–1.030)
Urobilinogen, UA: 0.2 mg/dL (ref 0.0–1.0)
pH: 5.5 (ref 5.0–8.0)

## 2020-06-19 NOTE — Progress Notes (Signed)
54 yo P4 presenting today for the evaluation of a 1-year history of lower abdominal pain. Patient describes the pain as intermittent, worst with intercourse. Patient reports that pain starts in lower abdomen and radiates to lower back. She denies pain with urination or constipation. Patient reports no menses for the past 6 months and previously she had a monthly period lasting 4-5 days. Patient is without any other complaints  Past Medical History:  Diagnosis Date  . Diabetes mellitus without complication ([place])   . Hyperlipidemia   . Known health problems: none    Past Surgical History:  Procedure Laterality Date  . NO PAST SURGERIES     Family History  Problem Relation Age of Onset  . Diabetes Mother   . Cancer Father   . Diabetes Brother    Social History   Tobacco Use  . Smoking status: Never Smoker  . Smokeless tobacco: Never Used  Vaping Use  . Vaping Use: Never used  Substance Use Topics  . Alcohol use: No  . Drug use: No   ROS See pertinent in HPI. All other systems reviewed and negative Blood pressure 107/72, pulse 67, weight 182 lb 11.2 oz (82.9 kg), last menstrual period 12/03/2019. GENERAL: Well-developed, well-nourished female in no acute distress.  ABDOMEN: Soft, nontender, nondistended. No organomegaly. PELVIC: Normal external female genitalia. Vagina is pink and rugated.  Normal discharge. Normal appearing cervix. Uterus is 10-weeks in size and slightly tender. No adnexal mass or tenderness. EXTREMITIES: No cyanosis, clubbing, or edema, 2+ distal pulses.  A/P 54 yo with lower abdominal pain  - vaginal swab ordered - pelvic ultrasound ordered - Urine culture collected - Patient will be contacted with abnormal results - RTC prn

## 2020-06-19 NOTE — Progress Notes (Signed)
Video Interpreter # [phone]

## 2020-06-20 LAB — CERVICOVAGINAL ANCILLARY ONLY
Bacterial Vaginitis (gardnerella): NEGATIVE
Candida Glabrata: NEGATIVE
Candida Vaginitis: POSITIVE — AB
Chlamydia: NEGATIVE
Comment: NEGATIVE
Comment: NEGATIVE
Comment: NEGATIVE
Comment: NEGATIVE
Comment: NEGATIVE
Comment: NORMAL
Neisseria Gonorrhea: NEGATIVE
Trichomonas: NEGATIVE

## 2020-06-20 LAB — URINE CULTURE

## 2020-06-23 ENCOUNTER — Ambulatory Visit: Payer: Self-pay | Attending: Family Medicine | Admitting: Pharmacist

## 2020-06-23 ENCOUNTER — Encounter: Payer: Self-pay | Admitting: Pharmacist

## 2020-06-23 ENCOUNTER — Other Ambulatory Visit: Payer: Self-pay

## 2020-06-23 DIAGNOSIS — Z23 Encounter for immunization: Secondary | ICD-10-CM

## 2020-06-23 NOTE — Progress Notes (Signed)
Patient presents for vaccination against influenza per orders of Dr. Johnson. Consent given. Counseling provided. No contraindications exists. Vaccine administered without incident.  ° °Luke Van Ausdall, PharmD, CPP °Clinical Pharmacist °[facility] °[phone] ° °

## 2020-06-24 ENCOUNTER — Telehealth (INDEPENDENT_AMBULATORY_CARE_PROVIDER_SITE_OTHER): Payer: Self-pay | Admitting: Lactation Services

## 2020-06-24 DIAGNOSIS — B3731 Acute candidiasis of vulva and vagina: Secondary | ICD-10-CM

## 2020-06-24 DIAGNOSIS — B373 Candidiasis of vulva and vagina: Secondary | ICD-10-CM

## 2020-06-24 MED ORDER — FLUCONAZOLE 150 MG PO TABS: 150.0000 mg | ORAL_TABLET | Freq: Once | ORAL | 0 refills | Status: AC

## 2020-06-24 NOTE — Addendum Note (Signed)
Addended by: Mora Bellman on: 06/24/2020 02:12 PM   Modules accepted: Orders

## 2020-06-24 NOTE — Telephone Encounter (Signed)
Called patient with assistance of [employer], [place] # [phone].   Called mobile phone and received message that phone not in service. Called patient on home phone and she did answer.   Patient informed that she has a vaginal yeast infection and Diflucan has been called into her pharmacy.   Patient voiced understanding. Patient to call back with further questions or concerns as needed.

## 2020-07-02 ENCOUNTER — Other Ambulatory Visit: Payer: Self-pay | Admitting: Family Medicine

## 2020-07-02 DIAGNOSIS — E119 Type 2 diabetes mellitus without complications: Secondary | ICD-10-CM

## 2020-07-02 MED FILL — ?METFORMIN HCL 500MG TABL: 500 | 30 days supply | Qty: 120 | Fill #1

## 2020-07-02 NOTE — Telephone Encounter (Signed)
Copied from [place] [id number]. Topic: Quick Communication - Rx Refill/Question >> Jul 02, 2020  3:11 PM Leward Quan A wrote: Medication: metFORMIN (GLUCOPHAGE) 500 MG tablet  Patient only picked up half the Rx on 05/20/20  Has the patient contacted their pharmacy? Yes.   (Agent: If no, request that the patient contact the pharmacy for the refill.) (Agent: If yes, when and what did the pharmacy advise?)  Preferred Pharmacy (with phone number or street name): [place], [place] [address]  Phone:  [phone] Fax:  [phone]     Agent: Please be advised that RX refills may take up to 3 business days. We ask that you follow-up with your pharmacy.

## 2020-07-02 NOTE — Telephone Encounter (Signed)
Requested medication (s) are due for refill today: Yes  Requested medication (s) are on the active medication list: Yes  Last refill:  1 month ago  Future visit scheduled: Yes  Notes to clinic:  Unable to refill due to the sig instructions, will need new Rx without increasing dose     Requested Prescriptions  Pending Prescriptions Disp Refills   metFORMIN (GLUCOPHAGE) 500 MG tablet 360 tablet 0    Sig: Take 1 tablet (500 mg total) by mouth 2 (two) times daily with a meal. For 1 week then increase to 2 pills twice daily after meal      Endocrinology:  Diabetes - Biguanides Failed - 07/02/2020  9:12 PM      Failed - Cr in normal range and within 360 days    Creat  Date Value Ref Range Status  11/26/2013 0.43 (L) 0.50 - 1.10 mg/dL Final   Creatinine, Ser  Date Value Ref Range Status  03/14/2020 0.55 (L) 0.57 - 1.00 mg/dL Final          Passed - HBA1C is between 0 and 7.9 and within 180 days    HbA1c, POC (prediabetic range)  Date Value Ref Range Status  03/14/2020 6.3 5.7 - 6.4 % Final          Passed - eGFR in normal range and within 360 days    GFR, Est African American  Date Value Ref Range Status  11/26/2013 >89 mL/min Final   GFR calc Af Amer  Date Value Ref Range Status  03/14/2020 125 >59 mL/min/1.73 Final    Comment:    **[facility] currently reports eGFR in compliance with the current**   recommendations of the [employer]. [facility] will   update reporting as new guidelines are published from the NKF-ASN   Task force.    GFR, Est Non African American  Date Value Ref Range Status  11/26/2013 >89 mL/min Final    Comment:      The estimated GFR is a calculation valid for adults (>=8 years old) that uses the CKD-EPI algorithm to adjust for age and sex. It is   not to be used for children, pregnant women, hospitalized patients,    patients on dialysis, or with rapidly changing kidney function. According to the NKDEP, eGFR >89 is normal, 60-89  shows mild impairment, 30-59 shows moderate impairment, 15-29 shows severe impairment and <15 is ESRD.     GFR calc non Af Amer  Date Value Ref Range Status  03/14/2020 108 >59 mL/min/1.73 Final          Passed - Valid encounter within last 6 months    Recent Outpatient Visits           1 week ago Need for influenza vaccination   [place], Jarome Matin, RPH-CPP   2 weeks ago Upper back pain, chronic   [place] Ladell Pier, MD   2 months ago Pain of ovary   [place], [place] J, NP   3 months ago Type 2 diabetes mellitus without complication, without long-term current use of insulin [facility])   [place], [place] J, NP   8 months ago Type 2 diabetes mellitus without complication, without long-term current use of insulin ([place])   [place] Antony Blackbird, MD       Future Appointments  In 1 month Fulp, Ander Gaster, MD [place]

## 2020-07-04 ENCOUNTER — Other Ambulatory Visit: Payer: Self-pay | Admitting: Family Medicine

## 2020-07-04 MED ORDER — METFORMIN HCL 500 MG PO TABS: 1000.0000 mg | ORAL_TABLET | Freq: Two times a day (BID) | ORAL | 2 refills | Status: DC

## 2020-07-21 MED FILL — ATORVASTATIN CALCIUM 20 MG: 20 | 30 days supply | Qty: 30 | Fill #4

## 2020-08-20 ENCOUNTER — Ambulatory Visit: Payer: No Typology Code available for payment source | Admitting: Family Medicine

## 2020-08-26 MED FILL — METFORMIN HCL 500 MG TABS: 500 | 30 days supply | Qty: 120 | Fill #2

## 2020-09-25 ENCOUNTER — Ambulatory Visit: Payer: No Typology Code available for payment source | Attending: Internal Medicine

## 2020-09-25 DIAGNOSIS — Z23 Encounter for immunization: Secondary | ICD-10-CM

## 2020-09-25 NOTE — Progress Notes (Signed)
   Covid-19 Vaccination Clinic  Name:  Elaine Burch    MRN: [id number] DOB: [birth year]  09/25/2020  [name] was observed post Covid-19 immunization for 15 minutes without incident. She was provided with Vaccine Information Sheet and instruction to access the V-Safe system.   [name] was instructed to call 911 with any severe reactions post vaccine: Marland Kitchen Difficulty breathing  . Swelling of face and throat  . A fast heartbeat  . A bad rash all over body  . Dizziness and weakness   Immunizations Administered    Name Date Dose VIS Date Route   Pfizer COVID-19 Vaccine 09/25/2020  4:35 PM 0.3 mL 07/23/2020 Intramuscular   Manufacturer: [place]   Lot: [id number]   NDC: [address]-9

## 2020-09-29 ENCOUNTER — Ambulatory Visit: Payer: No Typology Code available for payment source

## 2020-10-13 ENCOUNTER — Other Ambulatory Visit: Payer: Self-pay | Admitting: Internal Medicine

## 2020-10-13 ENCOUNTER — Other Ambulatory Visit: Payer: Self-pay | Admitting: Family Medicine

## 2020-10-13 DIAGNOSIS — E782 Mixed hyperlipidemia: Secondary | ICD-10-CM

## 2020-10-13 DIAGNOSIS — E119 Type 2 diabetes mellitus without complications: Secondary | ICD-10-CM

## 2020-10-13 MED FILL — METFORMIN HCL 500 MG TABS: 500 | 30 days supply | Qty: 120 | Fill #0

## 2020-10-13 MED FILL — ?ATORVASTATIN 20 MG TABLET: 20 | 30 days supply | Qty: 30 | Fill #0

## 2020-12-10 ENCOUNTER — Other Ambulatory Visit: Payer: Self-pay | Admitting: Family Medicine

## 2020-12-10 DIAGNOSIS — E119 Type 2 diabetes mellitus without complications: Secondary | ICD-10-CM

## 2020-12-10 NOTE — Telephone Encounter (Signed)
Medication Refill - Medication: metFORMIN (GLUCOPHAGE) 500 MG tablet   Has the patient contacted their pharmacy? Yes  (Agent: If no, request that the patient contact the pharmacy for the refill.) (Agent: If yes, when and what did the pharmacy advise?)call pcp  Preferred Pharmacy (with phone number or street name): [place], [place] [address]  [place] [address], [place] [address]  Phone:  [phone] Fax:  [phone]   Agent: Please be advised that RX refills may take up to 3 business days. We ask that you follow-up with your pharmacy.

## 2020-12-10 NOTE — Telephone Encounter (Signed)
  Notes to clinic:  Patient is due for labs No show last appt  Review for courtesy refill    Requested Prescriptions  Pending Prescriptions Disp Refills   metFORMIN (GLUCOPHAGE) 500 MG tablet 120 tablet 2    Sig: Take 2 tablets (1,000 mg total) by mouth 2 (two) times daily with a meal.      Endocrinology:  Diabetes - Biguanides Failed - 12/10/2020  9:45 AM      Failed - Cr in normal range and within 360 days    Creat  Date Value Ref Range Status  11/26/2013 0.43 (L) 0.50 - 1.10 mg/dL Final   Creatinine, Ser  Date Value Ref Range Status  03/14/2020 0.55 (L) 0.57 - 1.00 mg/dL Final          Failed - HBA1C is between 0 and 7.9 and within 180 days    HbA1c, POC (prediabetic range)  Date Value Ref Range Status  03/14/2020 6.3 5.7 - 6.4 % Final          Passed - eGFR in normal range and within 360 days    GFR, Est African American  Date Value Ref Range Status  11/26/2013 >89 mL/min Final   GFR calc Af Amer  Date Value Ref Range Status  03/14/2020 125 >59 mL/min/1.73 Final    Comment:    **[facility] currently reports eGFR in compliance with the current**   recommendations of the [employer]. [facility] will   update reporting as new guidelines are published from the NKF-ASN   Task force.    GFR, Est Non African American  Date Value Ref Range Status  11/26/2013 >89 mL/min Final    Comment:      The estimated GFR is a calculation valid for adults (>=13 years old) that uses the CKD-EPI algorithm to adjust for age and sex. It is   not to be used for children, pregnant women, hospitalized patients,    patients on dialysis, or with rapidly changing kidney function. According to the NKDEP, eGFR >89 is normal, 60-89 shows mild impairment, 30-59 shows moderate impairment, 15-29 shows severe impairment and <15 is ESRD.     GFR calc non Af Amer  Date Value Ref Range Status  03/14/2020 108 >59 mL/min/1.73 Final          Passed - Valid encounter within last 6  months    Recent Outpatient Visits           5 months ago Need for influenza vaccination   [place], Jarome Matin, RPH-CPP   5 months ago Upper back pain, chronic   [place] Ladell Pier, MD   7 months ago Pain of ovary   [place], [place] J, NP   9 months ago Type 2 diabetes mellitus without complication, without long-term current use of insulin [facility])   [place], [place] J, NP   1 year ago Type 2 diabetes mellitus without complication, without long-term current use of insulin ([place])   [place] [facility] Antony Blackbird, MD

## 2020-12-11 ENCOUNTER — Other Ambulatory Visit: Payer: Self-pay | Admitting: Family Medicine

## 2020-12-11 MED ORDER — METFORMIN HCL 500 MG PO TABS: 1000.0000 mg | ORAL_TABLET | Freq: Two times a day (BID) | ORAL | 0 refills | Status: DC

## 2020-12-11 MED FILL — METFORMIN HCL 500 MG TABS: 500 | 30 days supply | Qty: 120 | Fill #0

## 2020-12-15 ENCOUNTER — Other Ambulatory Visit: Payer: Self-pay | Admitting: Family Medicine

## 2020-12-15 DIAGNOSIS — E119 Type 2 diabetes mellitus without complications: Secondary | ICD-10-CM

## 2020-12-15 NOTE — Telephone Encounter (Signed)
Requested medication (s) are due for refill today: no  Requested medication (s) are on the active medication list:yes  Last refill: 04/18/2020  Future visit scheduled: no  Notes to clinic: last filled by Cammie Fulp  Due for follow up appt   Requested Prescriptions  Pending Prescriptions Disp Refills   TRUE METRIX BLOOD GLUCOSE TEST test strip [Pharmacy Med Name: TRUE METRIX TEST STRIP] 100 each 12    Sig: Use as instructed to check blood sugar 2-3 times      Endocrinology: Diabetes - Testing Supplies Passed - 12/15/2020  1:27 PM      Passed - Valid encounter within last 12 months    Recent Outpatient Visits           5 months ago Need for influenza vaccination   [place], Jarome Matin, RPH-CPP   6 months ago Upper back pain, chronic   [place] Ladell Pier, MD   8 months ago Pain of ovary   [place], [place] J, NP   9 months ago Type 2 diabetes mellitus without complication, without long-term current use of insulin [facility])   [place], [place] J, NP   1 year ago Type 2 diabetes mellitus without complication, without long-term current use of insulin ([place])   [place], MD       Future Appointments             In 1 month [place], Dionne Bucy, PA-C [place]

## 2020-12-29 ENCOUNTER — Other Ambulatory Visit: Payer: Self-pay

## 2020-12-29 ENCOUNTER — Ambulatory Visit: Payer: No Typology Code available for payment source | Attending: Family Medicine

## 2021-01-03 ENCOUNTER — Other Ambulatory Visit: Payer: Self-pay

## 2021-01-14 ENCOUNTER — Telehealth: Payer: Self-pay | Admitting: Family Medicine

## 2021-01-14 NOTE — Telephone Encounter (Signed)
I call the Pt since the appt will be Virtual, she does not need to come to the office sicen the provider won't be in the office

## 2021-01-15 ENCOUNTER — Other Ambulatory Visit: Payer: Self-pay

## 2021-01-15 ENCOUNTER — Ambulatory Visit: Payer: Self-pay | Attending: Physician Assistant | Admitting: Physician Assistant

## 2021-01-15 ENCOUNTER — Encounter: Payer: Self-pay | Admitting: Physician Assistant

## 2021-01-15 DIAGNOSIS — Z789 Other specified health status: Secondary | ICD-10-CM

## 2021-01-15 DIAGNOSIS — M62830 Muscle spasm of back: Secondary | ICD-10-CM

## 2021-01-15 DIAGNOSIS — E119 Type 2 diabetes mellitus without complications: Secondary | ICD-10-CM

## 2021-01-15 DIAGNOSIS — E782 Mixed hyperlipidemia: Secondary | ICD-10-CM

## 2021-01-15 MED ORDER — ATORVASTATIN CALCIUM 20 MG PO TABS
20.0000 mg | ORAL_TABLET | Freq: Every day | ORAL | 3 refills | Status: DC
  Filled 2021-01-15: qty 30, 30d supply, fill #0

## 2021-01-15 MED ORDER — IBUPROFEN 600 MG PO TABS
600.0000 mg | ORAL_TABLET | Freq: Three times a day (TID) | ORAL | 2 refills | Status: DC | PRN
  Filled 2021-01-15: qty 60, 20d supply, fill #0

## 2021-01-15 MED ORDER — METFORMIN HCL 500 MG PO TABS
1000.0000 mg | ORAL_TABLET | Freq: Two times a day (BID) | ORAL | 3 refills | Status: DC
  Filled 2021-01-15: qty 120, 30d supply, fill #0

## 2021-01-15 MED ORDER — CYCLOBENZAPRINE HCL 5 MG PO TABS
5.0000 mg | ORAL_TABLET | Freq: Three times a day (TID) | ORAL | 0 refills | Status: DC | PRN
Start: 2021-01-15 — End: 2021-09-07
  Filled 2021-01-15: qty 60, 20d supply, fill #0

## 2021-01-15 NOTE — Progress Notes (Signed)
Virtual Visit via Telephone Note  I connected with Martia Rahming on 01/15/21 at  9:50 AM EDT by telephone and verified that I am speaking with the correct person using two identifiers.  Location: Patient: home Provider:  [facility] office [place] with pacific interpreters is translating  I discussed the limitations, risks, security and privacy concerns of performing an evaluation and management service by telephone and the availability of in person appointments. I also discussed with the patient that there may be a patient responsible charge related to this service. The patient expressed understanding and agreed to proceed.   History of Present Illness: Patient needs RF on all her meds.  Last A1C=6.3 last June 2021.  Compliant with meds but running out soon.  Also needs RF for on and off back pain and muscle spasms.  Not checking blood sugars.  Not following diabetic diet.   Observations/Objective:  NAD.  A&Ox3   Assessment and Plan: 1. Type 2 diabetes mellitus without complication, without long-term current use of insulin (HCC) - atorvastatin (LIPITOR) 20 MG tablet; Take 1 tablet (20 mg total) by mouth daily.  Dispense: 30 tablet; Refill: 3 - metFORMIN (GLUCOPHAGE) 500 MG tablet; Take 2 tablets (1,000 mg total) by mouth 2 (two) times daily with a meal.  Dispense: 120 tablet; Refill: 3 - Lipid panel; Future - Hemoglobin A1c; Future  2. Mixed dyslipidemia - atorvastatin (LIPITOR) 20 MG tablet; Take 1 tablet (20 mg total) by mouth daily.  Dispense: 30 tablet; Refill: 3 - Lipid panel; Future - Comprehensive metabolic panel; Future  3. Muscle spasm of back - CBC with Differential/Platelet; Future - ibuprofen (ADVIL) 600 MG tablet; Take 1 tablet (600 mg total) by mouth every 8 (eight) hours as needed for moderate pain. Take after eating  Dispense: 60 tablet; Refill: 2 - cyclobenzaprine (FLEXERIL) 5 MG tablet; Take 1 tablet (5 mg total) by mouth 3 (three) times daily as needed for  muscle spasms.  Dispense: 60 tablet; Refill: 0  4. Language barrier-pacific interpreters used and additional time performing visit was required.   She will come today or tomorrow for labs  Follow Up Instructions: See PCP in 3 months for chronic conditions   I discussed the assessment and treatment plan with the patient. The patient was provided an opportunity to ask questions and all were answered. The patient agreed with the plan and demonstrated an understanding of the instructions.   The patient was advised to call back or seek an in-person evaluation if the symptoms worsen or if the condition fails to improve as anticipated.  I provided 14 minutes of non-face-to-face time during this encounter.   Freeman Caldron, PA-C  Patient ID: Nini Cavan, female   DOB: 1966-03-03, 55 y.o.   MRN: [id number]

## 2021-01-16 LAB — CBC WITH DIFFERENTIAL/PLATELET
Basophils Absolute: 0 10*3/uL (ref 0.0–0.2)
Basos: 1 %
EOS (ABSOLUTE): 0.1 10*3/uL (ref 0.0–0.4)
Eos: 2 %
Hematocrit: 41.1 % (ref 34.0–46.6)
Hemoglobin: 13.9 g/dL (ref 11.1–15.9)
Immature Grans (Abs): 0 10*3/uL (ref 0.0–0.1)
Immature Granulocytes: 0 %
Lymphocytes Absolute: 2 10*3/uL (ref 0.7–3.1)
Lymphs: 41 %
MCH: 31.2 pg (ref 26.6–33.0)
MCHC: 33.8 g/dL (ref 31.5–35.7)
MCV: 92 fL (ref 79–97)
Monocytes Absolute: 0.2 10*3/uL (ref 0.1–0.9)
Monocytes: 4 %
Neutrophils Absolute: 2.5 10*3/uL (ref 1.4–7.0)
Neutrophils: 52 %
Platelets: 187 10*3/uL (ref 150–450)
RBC: 4.45 x10E6/uL (ref 3.77–5.28)
RDW: 13.1 % (ref 11.7–15.4)
WBC: 4.8 10*3/uL (ref 3.4–10.8)

## 2021-01-16 LAB — LIPID PANEL
Chol/HDL Ratio: 3.3 ratio (ref 0.0–4.4)
Cholesterol, Total: 195 mg/dL (ref 100–199)
HDL: 59 mg/dL (ref >39)
LDL Chol Calc (NIH): 104 mg/dL — ABNORMAL HIGH (ref 0–99)
Triglycerides: 189 mg/dL — ABNORMAL HIGH (ref 0–149)
VLDL Cholesterol Cal: 32 mg/dL (ref 5–40)

## 2021-01-16 LAB — COMPREHENSIVE METABOLIC PANEL
ALT: 29 IU/L (ref 0–32)
AST: 29 IU/L (ref 0–40)
Albumin/Globulin Ratio: 1.8 (ref 1.2–2.2)
Albumin: 4.4 g/dL (ref 3.8–4.9)
Alkaline Phosphatase: 90 IU/L (ref 44–121)
BUN/Creatinine Ratio: 18 (ref 9–23)
BUN: 10 mg/dL (ref 6–24)
Bilirubin Total: 0.4 mg/dL (ref 0.0–1.2)
CO2: 23 mmol/L (ref 20–29)
Calcium: 9.8 mg/dL (ref 8.7–10.2)
Chloride: 101 mmol/L (ref 96–106)
Creatinine, Ser: 0.56 mg/dL — ABNORMAL LOW (ref 0.57–1.00)
Globulin, Total: 2.5 g/dL (ref 1.5–4.5)
Glucose: 222 mg/dL — ABNORMAL HIGH (ref 65–99)
Potassium: 3.9 mmol/L (ref 3.5–5.2)
Sodium: 141 mmol/L (ref 134–144)
Total Protein: 6.9 g/dL (ref 6.0–8.5)
eGFR: 109 mL/min/{1.73_m2} (ref >59)

## 2021-01-16 LAB — HEMOGLOBIN A1C
Est. average glucose Bld gHb Est-mCnc: 146 mg/dL
Hgb A1c MFr Bld: 6.7 % — ABNORMAL HIGH (ref 4.8–5.6)

## 2021-01-21 ENCOUNTER — Other Ambulatory Visit: Payer: Self-pay | Admitting: Physician Assistant

## 2021-01-21 ENCOUNTER — Other Ambulatory Visit: Payer: Self-pay

## 2021-01-21 DIAGNOSIS — E119 Type 2 diabetes mellitus without complications: Secondary | ICD-10-CM

## 2021-01-21 MED ORDER — GLIPIZIDE 5 MG PO TABS
5.0000 mg | ORAL_TABLET | Freq: Every day | ORAL | 3 refills | Status: DC
  Filled 2021-01-21: qty 30, 30d supply, fill #0

## 2021-01-22 ENCOUNTER — Other Ambulatory Visit: Payer: Self-pay

## 2021-01-23 ENCOUNTER — Other Ambulatory Visit: Payer: Self-pay

## 2021-02-13 ENCOUNTER — Other Ambulatory Visit: Payer: Self-pay

## 2021-02-13 ENCOUNTER — Telehealth: Payer: Self-pay

## 2021-02-13 MED ORDER — FLUCONAZOLE 150 MG PO TABS
150.0000 mg | ORAL_TABLET | Freq: Once | ORAL | 0 refills | Status: AC
  Filled 2021-02-13: qty 1, 1d supply, fill #0

## 2021-02-13 NOTE — Telephone Encounter (Signed)
If she requests to be seen in the office then she will need an in person. Next Diabetes visit not due till 04/2021 as she was just seen on 01/15/21. I have sent in Diflucan to her Pharmacy.

## 2021-02-13 NOTE — Telephone Encounter (Signed)
Pt is calling and was given the mobile bus locations for today and sat however the pt would like to be seen in office . Pt is having vaginal itching x 4 days or if md could call in fluconazole 150 mg one pill. Pt said dr Chapman Fitch gave herin the past per spanish interpreter leticia ID [phone]  Former patient of Dr.Fulp, saw angela on 01/15/21, does patient needs to be seen in person or virtual.

## 2021-02-13 NOTE — Telephone Encounter (Signed)
Please reach out to patient and inform her that Diflucan pill has been sent for her vaginal itching

## 2021-02-13 NOTE — Telephone Encounter (Signed)
Patient returned nurse call and informed of the message below, patient voice understanding.  Patient would like to know if she should keep her appointment for 02/24/2021 since fluconazole (DIFLUCAN) 150 MG tablet was prescribed. Best call back # is [phone].

## 2021-02-24 ENCOUNTER — Ambulatory Visit (INDEPENDENT_AMBULATORY_CARE_PROVIDER_SITE_OTHER): Payer: Self-pay | Admitting: Internal Medicine

## 2021-02-24 ENCOUNTER — Other Ambulatory Visit: Payer: Self-pay

## 2021-02-24 ENCOUNTER — Encounter: Payer: Self-pay | Admitting: Internal Medicine

## 2021-02-24 VITALS — BP 101/70 | HR 58 | Temp 97.3°F | Resp 16 | Wt 175.0 lb

## 2021-02-24 DIAGNOSIS — E119 Type 2 diabetes mellitus without complications: Secondary | ICD-10-CM

## 2021-02-24 DIAGNOSIS — E782 Mixed hyperlipidemia: Secondary | ICD-10-CM

## 2021-02-24 DIAGNOSIS — E785 Hyperlipidemia, unspecified: Secondary | ICD-10-CM | POA: Insufficient documentation

## 2021-02-24 LAB — GLUCOSE, POCT (MANUAL RESULT ENTRY): POC Glucose: 101 mg/dl — AB (ref 70–99)

## 2021-02-24 MED ORDER — ATORVASTATIN CALCIUM 20 MG PO TABS
20.0000 mg | ORAL_TABLET | Freq: Every day | ORAL | 1 refills | Status: DC
  Filled 2021-02-24: qty 90, 90d supply, fill #0
  Filled 2021-07-01: qty 90, 90d supply, fill #1

## 2021-02-24 MED ORDER — METFORMIN HCL 500 MG PO TABS
1000.0000 mg | ORAL_TABLET | Freq: Two times a day (BID) | ORAL | 1 refills | Status: DC
Start: 2021-02-24 — End: 2021-10-22
  Filled 2021-02-24: qty 180, 45d supply, fill #0
  Filled 2021-06-15: qty 180, 45d supply, fill #1

## 2021-02-24 MED ORDER — GLIPIZIDE 5 MG PO TABS
5.0000 mg | ORAL_TABLET | Freq: Every day | ORAL | 1 refills | Status: DC
  Filled 2021-02-24: qty 90, 90d supply, fill #0
  Filled 2021-06-15: qty 90, 90d supply, fill #1

## 2021-02-24 NOTE — Progress Notes (Signed)
  Subjective:    Elaine Burch - 55 y.o. female MRN [id number]  Date of birth: Oct 09, 1965  HPI  Elaine Burch is here for transfer of care from other PCP within this clinic group. She has a history of T2DM and HLD. Plans to reestablish with her orthopedic doctor about her new onset right shoulder pain. 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 She recently was prescribed Diflucan for presumed yeast candidiasis, reports that her symptoms of pruritis resolved after taking medication.    Health Maintenance:  Health Maintenance Due  Topic Date Due  . HIV Screening  Never done  . Hepatitis C Screening  Never done  . COLONOSCOPY (Pts 45-30yrs Insurance coverage will need to be confirmed)  Never done  . URINE MICROALBUMIN  10/09/2020  . COVID-19 Vaccine (2 - Pfizer 3-dose series) 10/16/2020  . MAMMOGRAM  11/07/2020    -  reports that she has never smoked. She has never used smokeless tobacco. - Review of Systems: Per HPI. - Past Medical History: Patient Active Problem List   Diagnosis Date Noted  . Muscle spasm of back 11/24/2013  . Dental caries 11/24/2013  . Visual changes 11/24/2013   - Medications: reviewed and updated   Objective:   Physical Exam BP 101/70   Pulse (!) 58   Temp (!) 97.3 F (36.3 C)   Resp 16   Wt 175 lb (79.4 kg)   SpO2 96%   BMI 32.01 kg/m  Physical Exam Constitutional:      General: She is not in acute distress.    Appearance: She is not diaphoretic.  Cardiovascular:     Rate and Rhythm: Normal rate.  Pulmonary:     Effort: Pulmonary effort is normal. No respiratory distress.  Musculoskeletal:        General: Normal range of motion.  Skin:    General: Skin is warm and dry.  Neurological:     Mental Status: She is alert and oriented to person, place, and time.  Psychiatric:        Mood and Affect: Affect normal.        Judgment: Judgment normal.        Assessment & Plan:    1. Type 2  diabetes mellitus without complication, without long-term current use of insulin (HCC) Glucose 101. Last A1c 6.7 in April 2022, well controlled. Continue current regimen.  - Glucose (CBG) - atorvastatin (LIPITOR) 20 MG tablet; Take 1 tablet (20 mg total) by mouth daily.  Dispense: 90 tablet; Refill: 1 - glipiZIDE (GLUCOTROL) 5 MG tablet; Take 1 tablet (5 mg total) by mouth daily before breakfast.  Dispense: 90 tablet; Refill: 1 - metFORMIN (GLUCOPHAGE) 500 MG tablet; Take 2 tablets (1,000 mg total) by mouth 2 (two) times daily with a meal.  Dispense: 180 tablet; Refill: 1  2. Mixed dyslipidemia - atorvastatin (LIPITOR) 20 MG tablet; Take 1 tablet (20 mg total) by mouth daily.  Dispense: 90 tablet; Refill: [place], D.O. 02/24/2021, 9:12 AM [facility] at [facility]

## 2021-02-24 NOTE — Progress Notes (Signed)
Pain-intermittent Shoulder- 8/10 back pain- 8/10 Hips- 8/10  Takes the ibuprofen and muscle relaxer Does not take pain away completely

## 2021-02-25 ENCOUNTER — Other Ambulatory Visit: Payer: Self-pay | Admitting: Obstetrics and Gynecology

## 2021-02-25 DIAGNOSIS — Z1231 Encounter for screening mammogram for malignant neoplasm of breast: Secondary | ICD-10-CM

## 2021-03-11 ENCOUNTER — Ambulatory Visit (INDEPENDENT_AMBULATORY_CARE_PROVIDER_SITE_OTHER): Payer: No Typology Code available for payment source | Admitting: Physician Assistant

## 2021-03-11 ENCOUNTER — Encounter: Payer: Self-pay | Admitting: Physician Assistant

## 2021-03-11 ENCOUNTER — Ambulatory Visit (INDEPENDENT_AMBULATORY_CARE_PROVIDER_SITE_OTHER): Payer: Self-pay

## 2021-03-11 DIAGNOSIS — M25511 Pain in right shoulder: Secondary | ICD-10-CM

## 2021-03-11 DIAGNOSIS — M7542 Impingement syndrome of left shoulder: Secondary | ICD-10-CM

## 2021-03-11 MED ORDER — LIDOCAINE HCL 1 % IJ SOLN
3.0000 mL | INTRAMUSCULAR | Status: AC | PRN
  Administered 2021-03-11: 3 mL

## 2021-03-11 MED ORDER — METHYLPREDNISOLONE ACETATE 40 MG/ML IJ SUSP
40.0000 mg | INTRAMUSCULAR | Status: AC | PRN
  Administered 2021-03-11: 40 mg via INTRA_ARTICULAR

## 2021-03-11 NOTE — Progress Notes (Addendum)
Office Visit Note   Patient: Elaine Burch           Date of Birth: January 23, 1966           MRN: [id number] Visit Date: 03/11/2021              Requested by: Camillia Herter, NP [address] [place],  [place] [address] PCP: Camillia Herter, NP   Assessment & Plan: Visit Diagnoses:  1. Impingement syndrome of left shoulder   2. Right shoulder pain, unspecified chronicity     Plan: We will refer her to physical therapy for range of motion, strengthening, modalities and home exercise program to both shoulders.  See her back in 4 weeks to see what type of response she had to therapy and to the right shoulder subacromial injection.  She may require left shoulder arthroscopy with extensive debridement if her pain continues despite these conservative measures.  Questions were encouraged and answered at length today using an interpreter.  Follow-Up Instructions: Return in about 4 weeks (around 04/08/2021).   Orders:  Orders Placed This Encounter  Procedures   Large Joint Inj   XR Shoulder Right   Ambulatory referral to Physical Therapy   No orders of the defined types were placed in this encounter.     Procedures: Large Joint Inj: R subacromial bursa on 03/11/2021 4:01 PM Indications: pain Details: 22 G 1.5 in needle, superior approach  Arthrogram: No  Medications: 3 mL lidocaine 1 %; 40 mg methylPREDNISolone acetate 40 MG/ML Outcome: tolerated well, no immediate complications Procedure, treatment alternatives, risks and benefits explained, specific risks discussed. Consent was given by the patient. Immediately prior to procedure a time out was called to verify the correct patient, procedure, equipment, support staff and site/side marked as required. Patient was prepped and draped in the usual sterile fashion.       Clinical Data: No additional findings.   Subjective: Chief Complaint  Patient presents with   Right Shoulder - Pain    HPI Patient is a  55 year old female who was last seen by Dr. Ninfa Linden in February for left shoulder pain.  She is sent for intra-articular injection with Dr. Junius Roads.  She states the injection helped for about 2 months and she had a little bit of pain during that time never completely resolved all the pain in the left shoulder.  He is now having 6-7 out of 10 pain in the left shoulder at worst.  She is also developed right shoulder pain which she states is 7-8 out of 10 pain at worst.  Pain in the right shoulder is been ongoing for 5 to 6 months.  No known injury.  MRI LEFT shoulder dated 09/03/2019 showed no acute fracture.  Mild supraspinatus and infraspinatus tendinosis.  Degenerative labrum.  Mild irregularity of the glenohumeral joint cartilage.  Mild AC joint changes. Patient is diabetic last hemoglobin A1c 1 month ago was 6.7.  Review of Systems Negative for fevers chills.  Please see HPI otherwise negative or noncontributory.  Objective: Vital Signs: There were no vitals taken for this visit.  Physical Exam Constitutional:      Appearance: She is not ill-appearing or diaphoretic.  Neurological:     Mental Status: She is alert and oriented to person, place, and time.  Psychiatric:        Mood and Affect: Mood normal.     Ortho Exam Bilateral shoulders: 5 out of 5 strength bilaterally with external and internal rotation against  resistance.  Empty can test is negative bilaterally.  Impingement testing positive bilaterally.  Liftoff test negative on the left positive on the right. Specialty Comments:  No specialty comments available.  Imaging: XR Shoulder Right  Result Date: 03/11/2021 Right shoulder 3 views: No acute fracture.  Glenohumeral joints well-maintained.  Mild AC joint changes.  No subluxation dislocation.    PMFS History: Patient Active Problem List   Diagnosis Date Noted   Type 2 diabetes mellitus without complication, without long-term current use of insulin ([place]) 02/24/2021    Hyperlipidemia 02/24/2021   Muscle spasm of back 11/24/2013   Past Medical History:  Diagnosis Date   Diabetes mellitus without complication ([place])    Hyperlipidemia    Known health problems: none     Family History  Problem Relation Age of Onset   Diabetes Mother    Cancer Father    Diabetes Brother     Past Surgical History:  Procedure Laterality Date   NO PAST SURGERIES     Social History   Occupational History   Not on file  Tobacco Use   Smoking status: Never Smoker   Smokeless tobacco: Never Used  Vaping Use   Vaping Use: Never used  Substance and Sexual Activity   Alcohol use: No   Drug use: No   Sexual activity: Yes    Birth control/protection: None

## 2021-03-19 ENCOUNTER — Ambulatory Visit: Payer: No Typology Code available for payment source

## 2021-03-24 ENCOUNTER — Other Ambulatory Visit: Payer: Self-pay

## 2021-03-24 ENCOUNTER — Ambulatory Visit (INDEPENDENT_AMBULATORY_CARE_PROVIDER_SITE_OTHER): Payer: Self-pay | Admitting: Physical Therapy

## 2021-03-24 ENCOUNTER — Encounter: Payer: Self-pay | Admitting: Physical Therapy

## 2021-03-24 DIAGNOSIS — M6281 Muscle weakness (generalized): Secondary | ICD-10-CM

## 2021-03-24 DIAGNOSIS — M25511 Pain in right shoulder: Secondary | ICD-10-CM

## 2021-03-24 DIAGNOSIS — G8929 Other chronic pain: Secondary | ICD-10-CM

## 2021-03-24 DIAGNOSIS — M25512 Pain in left shoulder: Secondary | ICD-10-CM

## 2021-03-24 NOTE — Patient Instructions (Signed)
Access Code: [email or web address] URL: [email or web address] Date: 03/24/2021 Prepared by: Daleen Bo  Exercises Seated Scapular Retraction - 2 x daily - 7 x weekly - 3 sets - 10 reps Isometric Shoulder Flexion at Wall - 2 x daily - 7 x weekly - 2 sets - 10 reps - 5 hold Standing Isometric Shoulder External Rotation with Doorway - 2 x daily - 7 x weekly - 2 sets - 10 reps - 5 hold Seated Shoulder Flexion Towel Slide at Table Top - 2 x daily - 7 x weekly - 2 sets - 10 reps - 5 hold

## 2021-03-24 NOTE — Therapy (Signed)
[facility] [address] [place], [place], [address] Phone: [phone]   Fax:  [phone]  Physical Therapy Evaluation  Patient Details  Name: Elaine Burch MRN: [id number] Date of Birth: 1965/10/31 Referring Provider (PT): Pete Pelt, [place]   Encounter Date: 03/24/2021   PT End of Session - 03/24/21 1532     Visit Number 1    Number of Visits 24    Date for PT Re-Evaluation 06/22/21    Authorization Type CAFA EXP    PT Start Time 0930    PT Stop Time 9326    PT Time Calculation (min) 44 min             Past Medical History:  Diagnosis Date   Diabetes mellitus without complication ([place])    Hyperlipidemia    Known health problems: none     Past Surgical History:  Procedure Laterality Date   NO PAST SURGERIES      There were no vitals filed for this visit.    Subjective Assessment - 03/24/21 0944     Subjective Pt states that the R and L shoulders hurt for some time now. It has been going on for at least a couple of months. Lifting overhead hurts very much and keeps her from doing work at home. She was told she is not able to lift anything OH with her L arm, so she has been using the R for everything. 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 Currently, her R shoulder hurts more than the L. Aggs: raising arm, lifting, brush hair, fasten her bra; Eases: massages, Vick's vapor rub; Waking up first thing in the morning is more painful and gets better throughout the day. Pt relays NT while lifting her arm high. Pt states the pain feels deep but it is around the shoulder. Current 7/10, Best 5/10, Worst 7/10. Pt denies locking/catching. Pt denies night pain. Pt denies unexplained. Pt denies system symptoms. Pt denies food affecting pain.    Patient is accompained by: Interpreter    Pertinent History DM2, hyperlipidemia    Patient Stated Goals Pt states she would like to reduce pain and getting  back to normal work.    Currently in Pain? Yes    Pain Score 7     Pain Location Shoulder    Pain Orientation Right    Pain Descriptors / Indicators Aching;Sore;Sharp    Pain Type Chronic pain                [facility] PT Assessment - 03/24/21 0001       Assessment   Medical Diagnosis M25.511 (ICD-10-CM) - Right shoulder pain, unspecified chronicity  M75.42 (ICD-10-CM) - Impingement syndrome of left shoulder    Referring Provider (PT) Pete Pelt, PA-C      Precautions   Precautions None      Restrictions   Weight Bearing Restrictions No      Balance Screen   Has the patient fallen in the past 6 months No      [place] residence      Prior Function   Level of Independence Independent      Cognition   Overall Cognitive Status Within Functional Limits for tasks assessed      Observation/Other Assessments   Focus on Therapeutic Outcomes (FOTO)  to be addended      Posture/Postural Control   Posture/Postural Control Postural limitations  Postural Limitations Rounded Shoulders;Increased thoracic kyphosis      ROM / Strength   AROM / PROM / Strength AROM;PROM;Strength      AROM   Overall AROM Comments R flexion 120; ABD 126 ER subocciptals IR SIJ reach; L 135 flexion, ABD 141, ER C7 reach, IR L3 reach      PROM   Overall PROM Comments WFL but painful at end range      Strength   Overall Strength Comments 4-/5 throughout R due to pain      Palpation   Palpation comment increased TTP along infra, supra, UT  bilat      Special Tests    Special Tests Biceps/Labral Tests;Rotator Cuff Impingement    Rotator Cuff Impingment tests Neer impingement test;Hawkins- Kennedy test;Empty Can test;Drop Arm test;Painful Arc of Motion    Biceps/Labral tests Pronated Load Test (SLAP)      Neer Impingement test    Findings Positive      Hawkins-Kennedy test   Findings Positive      Empty Can test   Findings Positive      Drop Arm  test   Findings Negative      Painful Arc of Motion   Findings Positive      O'Brien's Test   Findings Negative      Pronated load test (SLAP)   Findings Negative                        Objective measurements completed on examination: See above findings.       [facility] Adult PT Treatment/Exercise - 03/24/21 0001       Exercises   Exercises Shoulder   Exercises  Seated Scapular Retraction -3x10 Isometric Shoulder Flexion at Wall -2x105s Standing Isometric Shoulder External Rotation with Doorway -2x10 5s Seated Shoulder Flexion Towel Slide at Table Top - 2x10 5s                   PT Education - 03/24/21 1531     Education Details MOI, diagnosis, prognosis, anatomy, exercise progression, DOMS expectations, muscle firing, HEP, joint protection, postural changes, POC    Person(s) Educated Patient    Methods Explanation;Demonstration;Tactile cues;Verbal cues;Handout    Comprehension Verbalized understanding;Returned demonstration;Verbal cues required;Tactile cues required                         Plan - 03/24/21 1532     Clinical Impression Statement Pt is a 55 y.o. female presenting to PT eval today with CC of  bilateral shoulder pain R>L. Pt demonstrates decreased bilateral shoulder ROM, shoulder weakness, and difficulty with OH reaching. Pt's s/s appear consistent with generalized RTC related pain due to overuse or possible strain. Due to NT, there is potential for C/S involvement, though that appears less likely. Clinical testing does not suggest internal derangement at this time. Pt's impairments limit her occupation, ADL, and function. Pt would benefit from continued skilled therapy in order to reach goals and maximize functional bilat UE strength and ROM for full return to PLOF.    Personal Factors and Comorbidities Age;Time since onset of injury/illness/exacerbation;Profession    Examination-Activity Limitations Bathing;Transfers;Bed  Mobility;Reach Overhead;Carry;Dressing;Hygiene/Grooming    Examination-Participation Restrictions Occupation;Other;Community Activity;Interpersonal Relationship;Meal Prep;Cleaning;Laundry    Stability/Clinical Decision Making Stable/Uncomplicated    Clinical Decision Making Low    Rehab Potential Good    PT Frequency 2x / week   1-2   PT Duration 12 weeks  likely to DC by 8 weeks   PT Next Visit Plan review HEP, AAROM shoulder pulleys, IR/ER stretching, shoulder protraction, re-check C/S    PT Home Exercise Plan [email or web address]    Consulted and Agree with Plan of Care Patient             Patient will benefit from skilled therapeutic intervention in order to improve the following deficits and impairments:  Improper body mechanics, Pain, Impaired flexibility, Decreased strength, Hypomobility, Decreased range of motion, Postural dysfunction, Increased muscle spasms, Impaired UE functional use  Visit Diagnosis: Chronic right shoulder pain  Chronic left shoulder pain  Muscle weakness (generalized)     Problem List Patient Active Problem List   Diagnosis Date Noted   Type 2 diabetes mellitus without complication, without long-term current use of insulin ([place]) 02/24/2021   Hyperlipidemia 02/24/2021   Muscle spasm of back 11/24/2013   Daleen Bo PT, DPT 03/24/21 3:50 PM   [facility] [address] [place], [place], [address] Phone: [phone]   Fax:  [phone]  Name: Elaine Burch MRN: [id number] Date of Birth: May 30, 1966

## 2021-03-26 ENCOUNTER — Encounter: Payer: Self-pay | Admitting: Rehabilitative and Restorative Service Providers"

## 2021-03-26 ENCOUNTER — Ambulatory Visit (INDEPENDENT_AMBULATORY_CARE_PROVIDER_SITE_OTHER): Payer: Self-pay | Admitting: Rehabilitative and Restorative Service Providers"

## 2021-03-26 ENCOUNTER — Other Ambulatory Visit: Payer: Self-pay

## 2021-03-26 DIAGNOSIS — G8929 Other chronic pain: Secondary | ICD-10-CM

## 2021-03-26 DIAGNOSIS — M25612 Stiffness of left shoulder, not elsewhere classified: Secondary | ICD-10-CM

## 2021-03-26 DIAGNOSIS — M6281 Muscle weakness (generalized): Secondary | ICD-10-CM

## 2021-03-26 DIAGNOSIS — M25611 Stiffness of right shoulder, not elsewhere classified: Secondary | ICD-10-CM

## 2021-03-26 DIAGNOSIS — M25511 Pain in right shoulder: Secondary | ICD-10-CM

## 2021-03-26 NOTE — Patient Instructions (Signed)
Access Code: [email or web address] URL: [email or web address] Date: 03/26/2021 Prepared by: Vista Mink  Exercises Seated Scapular Retraction - 5 x daily - 7 x weekly - 1 sets - 5 reps Isometric Shoulder Flexion at Wall - 2 x daily - 7 x weekly - 2 sets - 10 reps - 5 hold Standing Isometric Shoulder External Rotation with Doorway - 2 x daily - 7 x weekly - 2 sets - 10 reps - 5 hold Seated Shoulder Flexion Towel Slide at Table Top - 2 x daily - 7 x weekly - 2 sets - 10 reps - 5 hold Supine Scapular Protraction in Flexion with Dumbbells - 2-3 x daily - 7 x weekly - 1 sets - 20 reps - 3 seconds hold Standing Shoulder Internal Rotation Stretch with Hands Behind Back - 2-3 x daily - 7 x weekly - 1 sets - 10 reps - 10 seconds hold Shoulder Internal Rotation with Resistance - 2 x daily - 7 x weekly - 1 sets - 10 reps Shoulder External Rotation with Anchored Resistance with Towel Under Elbow - 2 x daily - 3 x weekly - 2 sets - 10 reps - 3 hold

## 2021-03-26 NOTE — Therapy (Addendum)
[facility] Physical Therapy [address] [place], [place], [address] Phone: [phone]   Fax:  [phone]  Physical Therapy Treatment  Patient Details  Name: Elaine Burch MRN: [id number] Date of Birth: 06/22/1966 Referring Provider (PT): Pete Pelt, PA-C  PHYSICAL THERAPY DISCHARGE SUMMARY  Visits from Start of Care: 2  Current functional level related to goals / functional outcomes: See note   Remaining deficits: See note   Education / Equipment: HEP   Patient agrees to discharge. Patient goals were not met. Patient is being discharged due to not returning since the last visit.  Encounter Date: 03/26/2021     Past Medical History:  Diagnosis Date   Diabetes mellitus without complication ([place])    Hyperlipidemia    Known health problems: none     Past Surgical History:  Procedure Laterality Date   NO PAST SURGERIES      There were no vitals filed for this visit.                                      PT Long Term Goals - 03/26/21 1313       PT LONG TERM GOAL #1   Title Elaine Burch will be able to reach overhead and behind her back with 0/10 pain.    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    Time 6    Period Weeks    Status New    Target Date 05/07/21      PT LONG TERM GOAL #2   Title Improve B shoulder AROM for flexion to 170; ER to 90; IR to 60 and horizontal adduction to 40 degrees.    Time 6    Period Weeks    Status New    Target Date 05/07/01      PT LONG TERM GOAL #3   Title Improve B shoulder IR strength to at least 20 pounds and B ER strength to at least 14 pounds.    Time 6    Period Weeks    Status New    Target Date 05/07/21      PT LONG TERM GOAL #4   Title Improve FOTO.    Time 6    Period Weeks    Status New    Target Date 05/07/21      PT LONG TERM GOAL #5   Title Elaine Burch will be independent with her HEP at DC.    Time 6    Period Weeks     Status New    Target Date 05/07/21                     Patient will benefit from skilled therapeutic intervention in order to improve the following deficits and impairments:  Improper body mechanics, Pain, Impaired flexibility, Decreased strength, Hypomobility, Decreased range of motion, Postural dysfunction, Increased muscle spasms, Impaired UE functional use  Visit Diagnosis: Muscle weakness (generalized)  Stiffness of right shoulder, not elsewhere classified  Stiffness of left shoulder, not elsewhere classified  Chronic right shoulder pain     Problem List Patient Active Problem List   Diagnosis Date Noted   Type 2 diabetes mellitus without complication, without long-term current use of insulin ([place]) 02/24/2021   Hyperlipidemia 02/24/2021   Muscle spasm of back 11/24/2013    Farley Ly PT, MPT 05/14/2021, 11:57 AM  [facility]  [address] [place], [place], [address] Phone: [phone]   Fax:  [phone]  Name: Elaine Burch MRN: [id number] Date of Birth: [birth year]

## 2021-04-07 ENCOUNTER — Ambulatory Visit: Payer: Self-pay | Admitting: *Deleted

## 2021-04-07 ENCOUNTER — Other Ambulatory Visit: Payer: Self-pay

## 2021-04-07 VITALS — BP 118/80 | Wt 179.5 lb

## 2021-04-07 DIAGNOSIS — Z1211 Encounter for screening for malignant neoplasm of colon: Secondary | ICD-10-CM

## 2021-04-07 DIAGNOSIS — Z1239 Encounter for other screening for malignant neoplasm of breast: Secondary | ICD-10-CM

## 2021-04-07 NOTE — Progress Notes (Signed)
Patient ID: Elaine Burch, female    DOB: Apr 09, 1966  MRN: [id number]  CC: Annual Physical Exam  Subjective: Elaine Burch is a 55 y.o. female who presents for annual physical exam.   Her concerns today include:   DIABETES TYPE 2 FOLLOW-UP: 02/24/2021 per DO note: Glucose 101. Last A1c 6.7 in April 2022, well controlled. Continue current regimen. Metformin and Glipizide.   04/08/2021: Last A1C:   Results for orders placed or performed in visit on 04/08/21  POCT glycosylated hemoglobin (Hb A1C)  Result Value Ref Range   Hemoglobin A1C 6.1 (A) 4.0 - 5.6 %   HbA1c POC (<> result, manual entry)     HbA1c, POC (prediabetic range)     HbA1c, POC (controlled diabetic range)      Med Adherence:  '[x]'  Yes  Medication side effects:  '[]'  Yes    '[x]'  No Last eye exam: reports completed a few months ago  2. HYPERLIPIDEMIA FOLLOW-UP: 02/24/2021 per DO note: Atorvastatin.   04/08/2021: Doing well on current regimen. No side effects. No issues/concerns.   3. BACK PAIN FOLLOW-UP: 01/15/2021 per PA note: CBC, Ibuprofen, Cyclobenzaprine.  04/08/2021: Back pain worsening. Right > left. Previous medications not working.    Patient Active Problem List   Diagnosis Date Noted   Type 2 diabetes mellitus without complication, without long-term current use of insulin ([place]) 02/24/2021   Hyperlipidemia 02/24/2021   Muscle spasm of back 11/24/2013     Current Outpatient Medications on File Prior to Visit  Medication Sig Dispense Refill   atorvastatin (LIPITOR) 20 MG tablet Take 1 tablet (20 mg total) by mouth daily. 90 tablet 1   Blood Glucose Monitoring Suppl (TRUE METRIX METER) w/Device KIT Use to check blood sugars 2-3 times per day 1 kit 0   cyclobenzaprine (FLEXERIL) 5 MG tablet Take 1 tablet (5 mg total) by mouth 3 (three) times daily as needed for muscle spasms. 60 tablet 0   glipiZIDE (GLUCOTROL) 5 MG tablet Take 1 tablet (5 mg total) by mouth daily before breakfast. 90  tablet 1   glucose blood test strip USE AS INSTRUCTED TO CHECK BLOOD SUGAR 2-3 TIMES 100 strip 0   ibuprofen (ADVIL) 600 MG tablet Take 1 tablet (600 mg total) by mouth every 8 (eight) hours as needed for moderate pain. Take after eating 60 tablet 2   metFORMIN (GLUCOPHAGE) 500 MG tablet Take 2 tablets (1,000 mg total) by mouth 2 (two) times daily with a meal. 180 tablet 1   TRUEplus Lancets 28G MISC USE TO CHECK BLOOD SUGAR 2-3 TIMES DAILY. 100 each 11   No current facility-administered medications on file prior to visit.    No Known Allergies  Social History   Socioeconomic History   Marital status: Married    Spouse name: Not on file   Number of children: 4   Years of education: Not on file   Highest education level: 6th grade  Occupational History   Not on file  Tobacco Use   Smoking status: Never   Smokeless tobacco: Never  Vaping Use   Vaping Use: Never used  Substance and Sexual Activity   Alcohol use: No   Drug use: No   Sexual activity: Yes    Birth control/protection: None  Other Topics Concern   Not on file  Social History Narrative   Not on file   Social Determinants of Health   Financial Resource Strain: Not on file  Food Insecurity: No Food Insecurity   Worried  About Running Out of Food in the Last Year: Never true   Ran Out of Food in the Last Year: Never true  Transportation Needs: No Transportation Needs   Lack of Transportation (Medical): No   Lack of Transportation (Non-Medical): No  Physical Activity: Not on file  Stress: Not on file  Social Connections: Not on file  Intimate Partner Violence: Not on file    Family History  Problem Relation Age of Onset   Diabetes Mother    Cancer Father    Diabetes Brother     Past Surgical History:  Procedure Laterality Date   NO PAST SURGERIES      ROS: Review of Systems Negative except as stated above  PHYSICAL EXAM: BP 114/73 (BP Location: Left Arm, Patient Position: Sitting, Cuff Size:  Large)   Pulse 71   Temp 98.4 F (36.9 C)   Resp 16   Ht 5' 2.01" (1.575 m)   Wt 176 lb 9.6 oz (80.1 kg)   SpO2 97%   BMI 32.29 kg/m   Physical Exam HENT:     Head: Normocephalic and atraumatic.     Right Ear: Tympanic membrane, ear canal and external ear normal.     Left Ear: Tympanic membrane, ear canal and external ear normal.  Eyes:     Extraocular Movements: Extraocular movements intact.     Conjunctiva/sclera: Conjunctivae normal.     Pupils: Pupils are equal, round, and reactive to light.  Cardiovascular:     Rate and Rhythm: Normal rate and regular rhythm.     Pulses: Normal pulses.     Heart sounds: Normal heart sounds.  Pulmonary:     Effort: Pulmonary effort is normal.     Breath sounds: Normal breath sounds.  Chest:     Comments: Patient declined examination. Abdominal:     General: Bowel sounds are normal.     Palpations: Abdomen is soft.  Genitourinary:    Comments: Patient declined examination.  Musculoskeletal:        General: Normal range of motion.     Cervical back: Normal range of motion and neck supple.  Skin:    General: Skin is warm and dry.     Capillary Refill: Capillary refill takes less than 2 seconds.  Neurological:     General: No focal deficit present.     Mental Status: She is alert and oriented to person, place, and time.  Psychiatric:        Mood and Affect: Mood normal.        Behavior: Behavior normal.    Results for orders placed or performed in visit on 04/08/21  POCT glycosylated hemoglobin (Hb A1C)  Result Value Ref Range   Hemoglobin A1C 6.1 (A) 4.0 - 5.6 %   HbA1c POC (<> result, manual entry)     HbA1c, POC (prediabetic range)     HbA1c, POC (controlled diabetic range)      ASSESSMENT AND PLAN: 1. Annual physical exam: - Counseled on 150 minutes of exercise per week as tolerated, healthy eating (including decreased daily intake of saturated fats, cholesterol, added sugars, sodium), STI prevention, and routine  healthcare maintenance.  2. Screening for metabolic disorder: - CMP last obtained 01/15/2021.  3. Screening for deficiency anemia: - CBC last obtained 01/15/2021.  4. Thyroid disorder screen: - TSH to check thyroid function.  - TSH  5. Need for hepatitis C screening test: - Hepatitis C antibody to screen for hepatitis C.  - Hepatitis C Antibody  6. Encounter for screening for HIV: - HIV antibody to screen for human immunodeficiency virus.  - HIV antibody (with reflex)  7. Colon cancer screening: - Referral to Gastroenterology for colon cancer screening by colonoscopy. - Ambulatory referral to Gastroenterology  8. Type 2 diabetes mellitus without complication, without long-term current use of insulin ([place]): - Hemoglobin A1c at goal at 6.1%, goal < 7%. This is improved from previous hemoglobin A1c of 6.7% on 01/15/2021. Next hemoglobin A1c due October 2022.  - Continue Metformin and Glipizide as prescribed.  - Discussed the importance of healthy eating habits, low-carbohydrate diet, low-sugar diet, regular aerobic exercise (at least 150 minutes a week as tolerated) and medication compliance to achieve or maintain control of diabetes. - Microalbumin / creatinine urine ratio to check kidney function.  - Follow-up with primary provider in 3 months or sooner if needed.  - Microalbumin / creatinine urine ratio - POCT glycosylated hemoglobin (Hb A1C)  9. Hyperlipidemia, unspecified hyperlipidemia type: -Practice low-fat heart healthy diet and at least 150 minutes of moderate intensity exercise weekly as tolerated.  - Continue Atorvastatin as prescribed.  - Lipid panel last obtained 01/15/2021.  10. Chronic right-sided back pain, unspecified back location: - Diagnostic x-ray lumbar spine for further evaluation.  - Diagnostic x-ray thoracic spine for further evaluation.  - DG Lumbar Spine Complete; Future - DG Thoracic Spine W/Swimmers; Future  11. Language barrier: - [place]  Interpreters participated during today's visit. Interpreter Name: [place], [place] #: [id number].  Patient was given the opportunity to ask questions.  Patient verbalized understanding of the plan and was able to repeat key elements of the plan. Patient was given clear instructions to go to Emergency Department or return to medical center if symptoms don't improve, worsen, or new problems develop.The patient verbalized understanding.   Orders Placed This Encounter  Procedures   DG Lumbar Spine Complete   DG Thoracic Spine W/Swimmers   Hepatitis C Antibody   HIV antibody (with reflex)   Microalbumin / creatinine urine ratio   TSH   Ambulatory referral to Gastroenterology   POCT glycosylated hemoglobin (Hb A1C)      Elaine Brodt Zachery Dauer, NP

## 2021-04-07 NOTE — Patient Instructions (Signed)
Explained breast self awareness with Mikia Norden. Patient did not need a Pap smear today due to last Pap smear was 06/22/2019. Let her know [facility] will cover Pap smears every 3 years unless has a history of abnormal Pap smears. Referred patient to the [facility] for a screening mammogram on mobile unit. Appointment scheduled Thursday, April 09, 2021 at 0830. Patient aware of appointment and will be there. Scotty Kunz verbalized understanding.  Murlin Schrieber, Arvil [place], RN 10:55 AM

## 2021-04-07 NOTE — Progress Notes (Signed)
Elaine Burch is a 55 y.o. female who presents to [facility] clinic today with no complaints.    Pap Smear: Pap smear not completed today. Last Pap smear was 06/22/2019 at [facility] and was normal. Per patient has no history of an abnormal Pap smear. Last Pap smear result is available in Epic.   Physical exam: Breasts Breasts symmetrical. No skin abnormalities bilateral breasts. No nipple retraction bilateral breasts. No nipple discharge bilateral breasts. No lymphadenopathy. No lumps palpated bilateral breasts. No complaints of pain or tenderness on exam.     MS DIGITAL SCREENING BILATERAL  Result Date: 09/29/2017 CLINICAL DATA:  Screening. EXAM: DIGITAL SCREENING BILATERAL MAMMOGRAM WITH CAD COMPARISON:  Previous exam(s). ACR Breast Density Category b: There are scattered areas of fibroglandular density. FINDINGS: There are no findings suspicious for malignancy. Images were processed with CAD. IMPRESSION: No mammographic evidence of malignancy. A result letter of this screening mammogram will be mailed directly to the patient. RECOMMENDATION: Screening mammogram in one year. (Code:SM-B-01Y) BI-RADS CATEGORY  1: Negative. Electronically Signed   By: Fidela Salisbury M.D.   On: 09/29/2017 10:58   MM DIAG BREAST TOMO BILATERAL  Result Date: 09/09/2016 CLINICAL DATA:  Patient describes diffuse bilateral intermittent breast pain.Patient denies palpable lump. EXAM: 2D DIGITAL DIAGNOSTIC BILATERAL MAMMOGRAM WITH CAD AND ADJUNCT TOMO COMPARISON:  Previous exam(s). ACR Breast Density Category b: There are scattered areas of fibroglandular density. FINDINGS: Bilateral 2D CC and MLO projections were obtained today, with additional 3D tomosynthesis. There are no dominant masses, suspicious calcifications or secondary signs of malignancy within either breast. Mammographic images were processed with CAD. IMPRESSION: No evidence of malignancy within either breast.  RECOMMENDATION: Screening mammogram in one year.(Code:SM-B-01Y) Benign causes of breast pain, and possible remedies, were discussed with the patient. Patient was encouraged to follow-up with referring physician if pain became localized and persistent or if a palpable lump/mass developed. I have discussed the findings and recommendations with the patient. Results were also provided in writing at the conclusion of the visit. If applicable, a reminder letter will be sent to the patient regarding the next appointment. BI-RADS CATEGORY  1: Negative. Electronically Signed   By: Franki Cabot M.D.   On: 09/09/2016 14:22   MS DIGITAL SCREENING TOMO BILATERAL  Result Date: 11/07/2018 CLINICAL DATA:  Screening. EXAM: DIGITAL SCREENING BILATERAL MAMMOGRAM WITH TOMO AND CAD COMPARISON:  Previous exam(s). ACR Breast Density Category b: There are scattered areas of fibroglandular density. FINDINGS: There are no findings suspicious for malignancy. Images were processed with CAD. IMPRESSION: No mammographic evidence of malignancy. A result letter of this screening mammogram will be mailed directly to the patient. RECOMMENDATION: Screening mammogram in one year. (Code:SM-B-01Y) BI-RADS CATEGORY  1: Negative. Electronically Signed   By: Everlean Alstrom M.D.   On: 11/07/2018 13:44   MS DIGITAL DIAG TOMO BILAT  Result Date: 03/27/2020 CLINICAL DATA:  55 year old female with bilateral breast pain for 1-2 weeks. EXAM: DIGITAL DIAGNOSTIC BILATERAL MAMMOGRAM WITH CAD AND TOMO COMPARISON:  Previous exam(s). ACR Breast Density Category a: The breast tissue is almost entirely fatty. FINDINGS: 2D/3D full field views of both breasts demonstrate no suspicious mass, distortion or worrisome calcifications. Mammographic images were processed with CAD. IMPRESSION: No evidence of breast malignancy. RECOMMENDATION: Bilateral screening mammogram in 1 year. I have discussed the findings, causes of breast pain and recommendations with the  patient. If applicable, a reminder letter will be sent to the patient regarding the next appointment. BI-RADS CATEGORY  1: Negative. Electronically Signed   By: Margarette Canada M.D.   On: 03/27/2020 15:00     Pelvic/Bimanual Pap is not indicated today per BCCCP guidelines.   Smoking History: Patient has never smoked.   Patient Navigation: Patient education provided. Access to services provided for patient through [place] program. Spanish interpreter Rudene Anda from [facility] provided.   Colorectal Cancer Screening: Per patient has never had colonoscopy completed. FIT Test given to patient to complete. No complaints today.    Breast and Cervical Cancer Risk Assessment: Patient does not have family history of breast cancer, known genetic mutations, or radiation treatment to the chest before age 71. Patient does not have history of cervical dysplasia, immunocompromised, or DES exposure in-utero.  Risk Assessment     Risk Scores       04/07/2021 11/07/2018   Last edited by: Demetrius Revel, LPN Treazure Nery, Heath Gold, RN   5-year risk: 0.9 % 0.8 %   Lifetime risk: 7.3 % 7.7 %            A: BCCCP exam without pap smear No complaints.  P: Referred patient to the [place] for a screening mammogram on mobile unit. Appointment scheduled Thursday, April 09, 2021 at 0830.  Loletta Parish, RN 04/07/2021 10:55 AM

## 2021-04-08 ENCOUNTER — Ambulatory Visit (INDEPENDENT_AMBULATORY_CARE_PROVIDER_SITE_OTHER): Payer: Self-pay

## 2021-04-08 ENCOUNTER — Telehealth: Payer: Self-pay

## 2021-04-08 ENCOUNTER — Ambulatory Visit (INDEPENDENT_AMBULATORY_CARE_PROVIDER_SITE_OTHER): Payer: Self-pay | Admitting: Family

## 2021-04-08 ENCOUNTER — Ambulatory Visit (INDEPENDENT_AMBULATORY_CARE_PROVIDER_SITE_OTHER): Payer: No Typology Code available for payment source | Admitting: Physician Assistant

## 2021-04-08 ENCOUNTER — Encounter: Payer: Self-pay | Admitting: Family

## 2021-04-08 ENCOUNTER — Encounter: Payer: Self-pay | Admitting: Physician Assistant

## 2021-04-08 VITALS — BP 114/73 | HR 71 | Temp 98.4°F | Resp 16 | Ht 62.01 in | Wt 176.6 lb

## 2021-04-08 DIAGNOSIS — M25511 Pain in right shoulder: Secondary | ICD-10-CM

## 2021-04-08 DIAGNOSIS — G8929 Other chronic pain: Secondary | ICD-10-CM

## 2021-04-08 DIAGNOSIS — M549 Dorsalgia, unspecified: Secondary | ICD-10-CM

## 2021-04-08 DIAGNOSIS — Z0000 Encounter for general adult medical examination without abnormal findings: Secondary | ICD-10-CM

## 2021-04-08 DIAGNOSIS — Z130 Encounter for screening for diseases of the blood and blood-forming organs and certain disorders involving the immune mechanism: Secondary | ICD-10-CM

## 2021-04-08 DIAGNOSIS — Z13228 Encounter for screening for other metabolic disorders: Secondary | ICD-10-CM

## 2021-04-08 DIAGNOSIS — M7542 Impingement syndrome of left shoulder: Secondary | ICD-10-CM

## 2021-04-08 DIAGNOSIS — Z114 Encounter for screening for human immunodeficiency virus [HIV]: Secondary | ICD-10-CM

## 2021-04-08 DIAGNOSIS — Z1159 Encounter for screening for other viral diseases: Secondary | ICD-10-CM

## 2021-04-08 DIAGNOSIS — E119 Type 2 diabetes mellitus without complications: Secondary | ICD-10-CM

## 2021-04-08 DIAGNOSIS — E785 Hyperlipidemia, unspecified: Secondary | ICD-10-CM

## 2021-04-08 DIAGNOSIS — Z1329 Encounter for screening for other suspected endocrine disorder: Secondary | ICD-10-CM

## 2021-04-08 DIAGNOSIS — Z789 Other specified health status: Secondary | ICD-10-CM

## 2021-04-08 DIAGNOSIS — Z1211 Encounter for screening for malignant neoplasm of colon: Secondary | ICD-10-CM

## 2021-04-08 LAB — POCT GLYCOSYLATED HEMOGLOBIN (HGB A1C): Hemoglobin A1C: 6.1 % — AB (ref 4.0–5.6)

## 2021-04-08 NOTE — Progress Notes (Signed)
Pt presents for annual physical exam w/o pap. Pt states that she has been experiencing an aching pain on right upper back and feels to be going numb

## 2021-04-08 NOTE — Telephone Encounter (Signed)
Per Artis Delay please cancel all PT visits for now, thank you

## 2021-04-08 NOTE — Addendum Note (Signed)
Addended by: Robyne Peers on: 04/08/2021 04:11 PM   Modules accepted: Orders

## 2021-04-08 NOTE — Patient Instructions (Signed)
Cuidados preventivos en las mujeres de 40 a 64 aos de edad Preventive Care 40-53 Years Old, Female Los cuidados preventivos hacen referencia a las opciones en cuanto al estilo de vida y a las visitas al mdico, las cuales pueden promover la salud y el bienestar. Esto puede comprender lo siguiente: Un examen fsico anual. Esto tambin se conoce como visita de control de bienestar anual. Exmenes dentales y oculares de manera regular. Vacunas. Estudios para detectar ciertas afecciones. Elecciones para un estilo de vida saludable, por ejemplo: Seguir una dieta saludable. Practicar actividad fsica con regularidad. No consumir drogas ni productos que contengan nicotina y tabaco. Limitar el consumo de bebidas alcohlicas. Qu puedo esperar para mi visita de cuidado preventivo? Examen fsico El mdico revisar lo siguiente: Estatura y peso. Estos pueden usarse para calcular el IMC (ndice de masa corporal). El IMC es una medicin que indica si tiene un peso saludable. Frecuencia cardaca y presin arterial. Temperatura corporal. Piel para detectar manchas anormales. Asesoramiento Su mdico puede preguntarle acerca de: Problemas mdicos pasados. Antecedentes mdicos familiares. Consumo de tabaco, alcohol y drogas. Su bienestar emocional. Bienestar en el hogar y las relaciones personales. Su actividad sexual. Hbitos de alimentacin, ejercicio y sueo. Su trabajo y ambiente laboral. Acceso a armas de fuego. Mtodos anticonceptivos. Ciclo menstrual. Antecedentes de embarazo. Qu vacunas necesito?  Las vacunas se aplican a varias edades, segn un calendario. El mdico le recomendar vacunas segn su edad, sus antecedentes mdicos, su estilo de viday otros factores, como los viajes o el lugar donde trabaja. Qu pruebas necesito? Anlisis de sangre Niveles de lpidos y colesterol. Estos se pueden verificar cada 5 aos, o ms a menudo, si usted tiene ms de 50 aos de edad. Anlisis de  hepatitis C. Anlisis de hepatitis B. Pruebas de deteccin Pruebas de deteccin de cncer de pulmn. Es posible que se le realice esta prueba de deteccin a partir de los 55 aos de edad, si ha fumado durante 30 aos un paquete diario y sigue fumando o dej el hbito en algn momento en los ltimos 15 aos. Pruebas de deteccin de cncer colorrectal. Todos los adultos a partir de los 50 aos de edad y hasta los 75 aos de edad deben hacerse esta prueba de deteccin. El mdico puede recomendarle las pruebas de deteccin a partir de los 45 aos de edad si corre un mayor riesgo. Le realizarn pruebas cada 1 a 10 aos, segn los resultados y el tipo de prueba de deteccin. Pruebas de deteccin de la diabetes. Esto se realiza mediante un control del azcar en la sangre (glucosa) despus de no haber comido durante un periodo de tiempo (ayuno). Es posible que se le realice esta prueba cada 1 a 3 aos. Mamografa. Se puede realizar cada 1 o 2 aos. Hable con su mdico sobre cundo debe comenzar a realizarse mamografas de manera regular. Esto depende de si tiene antecedentes familiares de cncer de mama o no. Pruebas de deteccin de cncer relacionado con las mutaciones del BRCA. Es posible que se las deba realizar si tiene antecedentes de cncer de mama, de ovario, de trompas o peritoneal. Examen plvico y prueba de Papanicolaou. Esto se puede realizar cada 3 aos a partir de los 21 aos de edad. A partir de los 30 aos, esto se puede realizar cada 5 aos si usted se realiza una prueba de Papanicolaou en combinacin con una prueba de deteccin del virus del papiloma humano (VPH). Otras pruebas Pruebas de enfermedades de transmisin sexual (ETS), si est en   riesgo. Densitometra sea. Esto se realiza para detectar osteoporosis. Se le puede realizar este examen de deteccin si tiene un riesgo alto de tener osteoporosis. Hable con su mdico sobre los resultados de las pruebas, las opciones detratamiento  y, si corresponde, la necesidad de realizar ms pruebas. Siga estas instrucciones en su casa: Comida y bebida  Siga una dieta que incluya frutas y verduras frescas, cereales integrales, protenas magras y productos lcteos descremados. Tome los suplementos vitamnicos y minerales como se lo haya indicado el mdico. No beba alcohol si: Su mdico le indica no hacerlo. Est embarazada, puede estar embarazada o est tratando de quedar embarazada. Si bebe alcohol: Limite la cantidad que consume de 0 a 1 medida por da. Est atenta a la cantidad de alcohol que hay en las bebidas que toma. En los [place], una medida equivale a una botella de cerveza de 12 oz (355 ml), un vaso de vino de 5 oz (148 ml) o un vaso de una bebida alcohlica de alta graduacin de 1 oz (44 ml).  Estilo de vida Cudese los dientes y las encas a diario. Cepllese los dientes a la maAyako y a la noche con pasta dental con fluoruro. Use hilo dental una vez al da. Mantngase activa. Haga al menos 30 minutos de ejercicio, 5 o ms das cada semana. No consuma ningn producto que contenga nicotina o tabaco, como cigarrillos, cigarrillos electrnicos y tabaco de mascar. Si necesita ayuda para dejar de fumar, consulte al mdico. No consuma drogas. Si es sexualmente activa, practique sexo seguro. Use un condn u otra forma de proteccin para prevenir las ITS (infecciones de transmisin sexual). Si no desea quedar embarazada, use un mtodo anticonceptivo. Si busca un embarazo, realice una consulta previa al embarazo con el mdico. Si el mdico se lo indic, tome una dosis baja de aspirina diariamente a partir de los 50 aos de edad. Encuentre formas saludables de lidiar con el estrs tales como: Meditacin, yoga o escuchar msica. Lleve un diario personal. Hable con una persona confiable. Pase tiempo con amigos y familiares. Seguridad Usa siempre el cinturn de seguridad al conducir o viajar en un vehculo. No  conduzca: Si ha estado bebiendo alcohol. No viaje con un conductor que ha estado bebiendo. Si est cansada o distrada. Mientras est enviando mensajes de texto. Use un casco y otros equipos de proteccin durante las actividades deportivas. Si tiene armas de fuego en su casa, asegrese de seguir todos los procedimientos de seguridad correspondientes. Cundo volver? Visite al mdico una vez al ao para una visita anual de control de bienestar. Pregntele al mdico con qu frecuencia debe realizarse un control de la vista y los dientes. Mantenga su esquema de vacunacin al da. Esta informacin no tiene como fin reemplazar el consejo del mdico. Asegresede hacerle al mdico cualquier pregunta que tenga. Document Revised: 07/23/2020 Document Reviewed: 07/23/2020 Elsevier Patient Education  2022 Elsevier Inc.  

## 2021-04-08 NOTE — Progress Notes (Signed)
HPI: [name] returns today in follow-up bilateral shoulders.  She states that her left shoulder is 50 to 60% better.  Her main complaint today is her right shoulder.  She had a cortisone injection on 03/11/2021 which only gave her relief for about 2 days.  She states physical therapy is making her right shoulder pain worse.  She is taking Tylenol and ibuprofen without any real relief.  States that her pain at worst is 8 out of 10 with medications 5 out of 10 pain in the right shoulder.  She is unable to lift heavy objects with the left shoulder due to discomfort.  She notes diminished range of motion of the right shoulder.  She is accompanied by an interpreter today.  Review of systems: See HPI otherwise negative  Physical exam: General well-developed well-nourished female no acute distress mood affect appropriate. Bilateral shoulders: She has weakness with external and internal rotation against resistance right shoulder.  5 out of 5 strength on the left with external and internal rotation against resistance.  Empty can test is negative bilaterally.  Positive impingement testing on the right negative on the left.  Liftoff test is negative on the left positive on the right.  Impression: Left shoulder impingement improved Right shoulder pain  Plan: She will continue to do home exercise program for mainly left shoulder.  We will hold on her physical therapy for now.  Obtain an MRI of the right shoulder rule out rotator cuff tear due to the fact that patient is failed conservative treatment and still has weakness and pain in the shoulder.  Questions were encouraged and answered at length.

## 2021-04-09 ENCOUNTER — Other Ambulatory Visit: Payer: Self-pay | Admitting: Family

## 2021-04-09 ENCOUNTER — Other Ambulatory Visit: Payer: Self-pay

## 2021-04-09 ENCOUNTER — Ambulatory Visit
Admission: RE | Admit: 2021-04-09 | Discharge: 2021-04-09 | Disposition: A | Discharge status: Home / Self Care | Payer: No Typology Code available for payment source | Source: Ambulatory Visit | Attending: Obstetrics and Gynecology | Admitting: Obstetrics and Gynecology

## 2021-04-09 DIAGNOSIS — M419 Scoliosis, unspecified: Secondary | ICD-10-CM

## 2021-04-09 DIAGNOSIS — M5137 Other intervertebral disc degeneration, lumbosacral region: Secondary | ICD-10-CM

## 2021-04-09 DIAGNOSIS — Z1231 Encounter for screening mammogram for malignant neoplasm of breast: Secondary | ICD-10-CM

## 2021-04-09 LAB — TSH: TSH: 2.31 u[IU]/mL (ref 0.450–4.500)

## 2021-04-09 LAB — MICROALBUMIN / CREATININE URINE RATIO
Creatinine, Urine: 41.8 mg/dL
Microalb/Creat Ratio: 14 mg/g creat (ref 0–29)
Microalbumin, Urine: 5.9 ug/mL

## 2021-04-09 LAB — HEPATITIS C ANTIBODY: Hep C Virus Ab: 0.2 s/co ratio (ref 0.0–0.9)

## 2021-04-09 LAB — HIV ANTIBODY (ROUTINE TESTING W REFLEX): HIV Screen 4th Generation wRfx: NONREACTIVE

## 2021-04-09 NOTE — Progress Notes (Signed)
Please call patient with update.   Microalbumin / creatinine urine ratio results as follows:  There is no significant amount of protein in the urine. This is good news. Increased protein in the urine could mean that the diabetes is affecting the kidney.

## 2021-04-09 NOTE — Progress Notes (Signed)
Please call patient with update.   Thyroid function normal.   Hepatitis C negative.   HIV negative.   Diabetes discussed in office.

## 2021-04-09 NOTE — Progress Notes (Signed)
Please call patient with update.   Mild scoliosis. Referral placed to Orthopedics for further evaluation and management. Their office should call patient within 2 weeks with appointment details.

## 2021-04-09 NOTE — Progress Notes (Signed)
Please call patient with update.   Mild scoliosis of thoracic spine.   Mild arthritis of lumbar spine.   Referral to Orthopedics for further evaluation and management. Their office should call patient within 2 weeks with appointment details.

## 2021-04-10 ENCOUNTER — Other Ambulatory Visit: Payer: Self-pay | Admitting: Family Medicine

## 2021-04-10 ENCOUNTER — Encounter: Payer: Self-pay | Admitting: Physical Therapy

## 2021-04-10 ENCOUNTER — Other Ambulatory Visit: Payer: Self-pay

## 2021-04-10 DIAGNOSIS — E119 Type 2 diabetes mellitus without complications: Secondary | ICD-10-CM

## 2021-04-10 MED ORDER — TRUE METRIX BLOOD GLUCOSE TEST VI STRP
ORAL_STRIP | 9 refills | Status: DC
  Filled 2021-04-10: qty 100, 33d supply, fill #0

## 2021-04-10 MED FILL — Lancets: 30 days supply | Qty: 100 | Fill #0 | Status: AC

## 2021-04-13 ENCOUNTER — Encounter: Payer: Self-pay | Admitting: Family Medicine

## 2021-04-13 ENCOUNTER — Other Ambulatory Visit: Payer: Self-pay

## 2021-04-13 ENCOUNTER — Ambulatory Visit (INDEPENDENT_AMBULATORY_CARE_PROVIDER_SITE_OTHER): Payer: No Typology Code available for payment source | Admitting: Family Medicine

## 2021-04-13 DIAGNOSIS — M545 Low back pain, unspecified: Secondary | ICD-10-CM

## 2021-04-13 DIAGNOSIS — M546 Pain in thoracic spine: Secondary | ICD-10-CM

## 2021-04-13 DIAGNOSIS — G8929 Other chronic pain: Secondary | ICD-10-CM

## 2021-04-13 MED ORDER — BACLOFEN 10 MG PO TABS
5.0000 mg | ORAL_TABLET | Freq: Three times a day (TID) | ORAL | 3 refills | Status: DC | PRN
  Filled 2021-04-13: qty 30, 10d supply, fill #0

## 2021-04-13 MED ORDER — MELOXICAM 15 MG PO TABS
7.5000 mg | ORAL_TABLET | Freq: Every day | ORAL | 6 refills | Status: DC | PRN
  Filled 2021-04-13: qty 30, 30d supply, fill #0
  Filled 2021-06-15: qty 90, 90d supply, fill #1

## 2021-04-13 NOTE — Progress Notes (Signed)
   Office Visit Note   Patient: Elaine Burch           Date of Birth: 12/12/1965           MRN: [id number] Visit Date: 04/13/2021 Requested by: Camillia Herter, NP [address] [place],  [place] [address] PCP: Camillia Herter, NP  Subjective: Chief Complaint  Patient presents with   Lower Back - Pain    Pain in the lower back x 2-3 years. The pain is always there, but at different levels of severity. Pain radiates into the upper lateral thighs. Ibuprofen helps.Cyclobenzaprine helps a little bit.  H/o 2 falls in 2006 & 2016.    HPI: She is here with back pain.  The interpreter was present today.  Symptoms started about 2 or 3 years ago, with pain consistently in the lower back but recently in the right shoulder blade area.  She has been having numbness sensation in her scapular region.  No radicular symptoms, no weakness in her extremities.  Ibuprofen helps to lower back pain but not the upper.  She has been to physical therapy for her shoulder but not for her spine.  She has not been to a Restaurant manager, fast food.  She cannot think of a specific trauma that started this.                ROS:   All other systems were reviewed and are negative.  Objective: Vital Signs: There were no vitals taken for this visit.  Physical Exam:  General:  Alert and oriented, in no acute distress. Pulm:  Breathing unlabored. Psy:  Normal mood, congruent affect.  Back: She has limited ability to forward flex because of pain.  She has multiple tender trigger points in the right scapular region.  Neck range of motion is full and she has 5/5 upper extremity strength, 2+ DTRs. Lower back: Tender over the L5-S1 level.   Imaging: No x-rays today but recent x-rays were reviewed showing mild degenerative disc disease, and mild thoracic scoliosis.    Assessment & Plan: Chronic thoracolumbar back pain -Discussed with patient, elected to proceed with MRI of the thoracic spine.  Depending on the  results, consider physical therapy, chiropractic, or possibly epidural injection. -Baclofen and meloxicam given to take as needed.     Procedures: No procedures performed        PMFS History: Patient Active Problem List   Diagnosis Date Noted   Type 2 diabetes mellitus without complication, without long-term current use of insulin ([place]) 02/24/2021   Hyperlipidemia 02/24/2021   Muscle spasm of back 11/24/2013   Past Medical History:  Diagnosis Date   Diabetes mellitus without complication ([place])    Hyperlipidemia    Known health problems: none     Family History  Problem Relation Age of Onset   Diabetes Mother    Cancer Father    Diabetes Brother    Breast cancer Neg Hx     Past Surgical History:  Procedure Laterality Date   NO PAST SURGERIES     Social History   Occupational History   Not on file  Tobacco Use   Smoking status: Never   Smokeless tobacco: Never  Vaping Use   Vaping Use: Never used  Substance and Sexual Activity   Alcohol use: No   Drug use: No   Sexual activity: Yes    Birth control/protection: None

## 2021-04-14 ENCOUNTER — Other Ambulatory Visit: Payer: Self-pay

## 2021-04-14 ENCOUNTER — Encounter: Payer: Self-pay | Admitting: Physical Therapy

## 2021-04-17 ENCOUNTER — Encounter: Payer: Self-pay | Admitting: Internal Medicine

## 2021-04-21 ENCOUNTER — Encounter: Payer: Self-pay | Admitting: Physical Therapy

## 2021-04-23 ENCOUNTER — Encounter: Payer: Self-pay | Admitting: Rehabilitative and Restorative Service Providers"

## 2021-05-04 ENCOUNTER — Ambulatory Visit
Admission: RE | Admit: 2021-05-04 | Discharge: 2021-05-04 | Disposition: A | Discharge status: Home / Self Care | Payer: No Typology Code available for payment source | Source: Ambulatory Visit | Attending: Physician Assistant | Admitting: Physician Assistant

## 2021-05-04 ENCOUNTER — Ambulatory Visit
Admission: RE | Admit: 2021-05-04 | Discharge: 2021-05-04 | Disposition: A | Discharge status: Home / Self Care | Payer: No Typology Code available for payment source | Source: Ambulatory Visit | Attending: Family Medicine | Admitting: Family Medicine

## 2021-05-04 DIAGNOSIS — M25511 Pain in right shoulder: Secondary | ICD-10-CM

## 2021-05-04 DIAGNOSIS — M546 Pain in thoracic spine: Secondary | ICD-10-CM

## 2021-05-05 ENCOUNTER — Telehealth: Payer: Self-pay | Admitting: Family Medicine

## 2021-05-05 NOTE — Telephone Encounter (Signed)
Thoracic spine MRI scan does not show any nerve impingement.  There is a very tiny disc bulge at T8-9.  No indication for surgery.  Shoulder MRI scan shows a high-grade partial tear of the rotator cuff tendon.  This will possibly need surgical repair.  I recommend following up with Dr. Ninfa Linden to discuss this in further detail.  Presuming surgery is done, she will need physical therapy afterward and at that time, they can also work on her back pain.

## 2021-05-06 NOTE — Telephone Encounter (Signed)
The patient already has an appointment with Dr. Ninfa Linden scheduled for MRI review. Per Dr. Junius Roads, ok to hold off on calling, as this will all be covered at that office visit.

## 2021-05-25 ENCOUNTER — Other Ambulatory Visit: Payer: Self-pay

## 2021-05-25 ENCOUNTER — Ambulatory Visit (AMBULATORY_SURGERY_CENTER): Payer: Self-pay | Admitting: *Deleted

## 2021-05-25 VITALS — Ht 62.0 in | Wt 183.0 lb

## 2021-05-25 DIAGNOSIS — Z1211 Encounter for screening for malignant neoplasm of colon: Secondary | ICD-10-CM

## 2021-05-25 MED ORDER — PLENVU 140 G PO SOLR: 1.0000 | ORAL | 0 refills | Status: DC

## 2021-05-25 NOTE — Progress Notes (Signed)
No egg or soy allergy known to patient  No  past sedation no surgeries or procedures Patient denies ever being told they had issues or difficulty with intubation  No FH of Malignant Hyperthermia No diet pills per patient No home 02 use per patient  No blood thinners per patient  Pt denies issues with constipation daily- states occasional issue but not daily No A fib or A flutter  EMMI video to pt or via [place] 19 guidelines implemented in PV today with Pt and RN   Pt is fully vaccinated  for Covid   Due to the COVID-19 pandemic we are asking patients to follow certain guidelines.  Pt aware of COVID protocols and [place] guidelines   Interpreter in Pv with pt today- all questions answered through interpreter   Plenvu  sample Lot- [id number] exp 06/2022

## 2021-05-26 ENCOUNTER — Ambulatory Visit (INDEPENDENT_AMBULATORY_CARE_PROVIDER_SITE_OTHER): Payer: Self-pay | Admitting: Orthopaedic Surgery

## 2021-05-26 ENCOUNTER — Encounter: Payer: Self-pay | Admitting: Orthopaedic Surgery

## 2021-05-26 DIAGNOSIS — M75111 Incomplete rotator cuff tear or rupture of right shoulder, not specified as traumatic: Secondary | ICD-10-CM

## 2021-05-26 DIAGNOSIS — M7541 Impingement syndrome of right shoulder: Secondary | ICD-10-CM

## 2021-05-26 DIAGNOSIS — M25511 Pain in right shoulder: Secondary | ICD-10-CM

## 2021-05-26 MED ORDER — LIDOCAINE HCL 1 % IJ SOLN
3.0000 mL | INTRAMUSCULAR | Status: AC | PRN
  Administered 2021-05-26: 3 mL

## 2021-05-26 MED ORDER — METHYLPREDNISOLONE ACETATE 40 MG/ML IJ SUSP
40.0000 mg | INTRAMUSCULAR | Status: AC | PRN
  Administered 2021-05-26: 40 mg via INTRA_ARTICULAR

## 2021-05-26 NOTE — Progress Notes (Signed)
Office Visit Note   Patient: Elaine Burch           Date of Birth: 08-Mar-1966           MRN: [id number] Visit Date: 05/26/2021              Requested by: Camillia Herter, NP [address] [place],  [place] [address] PCP: Camillia Herter, NP   Assessment & Plan: Visit Diagnoses:  1. Right shoulder pain, unspecified chronicity   2. 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 Impingement syndrome of right shoulder     Plan: Through the interpreter and using a shoulder model explained what is going on with her right shoulder.  I feel that we should at least try 1 more steroid injection in her right shoulder subacromial space and see what this does for her.  She agreed to this treatment plan and tolerated well.  I would like to see her back in just 2 weeks.  I talked about the possibility of an arthroscopic intervention if conservative treatment fails and I explained why this is recommended based on what we can tell her through the interpreter.  Again we will go over this again in 2 weeks if she does not have a good response to the steroid injection.  Follow-Up Instructions: Return in about 4 weeks (around 06/23/2021).   Orders:  Orders Placed This Encounter  Procedures   Large Joint Inj    No orders of the defined types were placed in this encounter.     Procedures: Large Joint Inj: R subacromial bursa on 05/26/2021 9:49 AM Indications: pain and diagnostic evaluation Details: 22 G 1.5 in needle  Arthrogram: No  Medications: 3 mL lidocaine 1 %; 40 mg methylPREDNISolone acetate 40 MG/ML Outcome: tolerated well, no immediate complications Procedure, treatment alternatives, risks and benefits explained, specific risks discussed. Consent was given by the patient. Immediately prior to procedure a time out was called to verify the correct patient, procedure, equipment, support staff and site/side marked as required. Patient was prepped and draped in the  usual sterile fashion.      Clinical Data: No additional findings.   Subjective: Chief Complaint  Patient presents with   Right Shoulder - Follow-up  The patient comes in today with an interpreter to go over a MRI of her right shoulder.  We at least perform 1 steroid injection in the right shoulder.  She is only 55 years old and very active.  She hurts with overhead activities and reaching behind her and has been painful and weak for her in terms of the right shoulder.  It was appropriate at this point to order a MRI.  [facility]  She is still experiencing shoulder issues with that right shoulder.  HPI  Review of Systems There is currently listed no headache, chest pain, shortness of breath, fever, chills, nausea, vomiting  Objective: Vital Signs: There were no vitals taken for this visit.  Physical Exam She is alert and orient x3 and in no acute distress Ortho Exam Examination of her right shoulder shows positive signs of impingement with some slight weakness of the rotator cuff.  The motion is full.  It is painful though. Specialty Comments:  No specialty comments available.  Imaging: No results found. The MRI of her right shoulder does show partial-thickness bursal surface tear of the rotator cuff.  There is some tendinosis of the cuff  as well.  There is slight tearing of the superior labrum.  The biceps anchor is intact.  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 05/26/2021   Impingement syndrome of right shoulder 05/26/2021   Type 2 diabetes mellitus without complication, without long-term current use of insulin ([place]) 02/24/2021   Hyperlipidemia 02/24/2021   Muscle spasm of back 11/24/2013   Past Medical History:  Diagnosis Date   Arthritis    beginning   Diabetes mellitus without complication (HCC)    GERD (gastroesophageal reflux  disease)    past hx of GERD   Hyperlipidemia     Family History  Problem Relation Age of Onset   Diabetes Mother    Cancer Father    Diabetes Brother    Breast cancer Neg Hx    Colon cancer Neg Hx    Colon polyps Neg Hx    Esophageal cancer Neg Hx    Rectal cancer Neg Hx    Stomach cancer Neg Hx     Past Surgical History:  Procedure Laterality Date   NO PAST SURGERIES     Social History   Occupational History   Not on file  Tobacco Use   Smoking status: Never   Smokeless tobacco: Never  Vaping Use   Vaping Use: Never used  Substance and Sexual Activity   Alcohol use: No   Drug use: No   Sexual activity: Yes    Birth control/protection: None

## 2021-06-10 ENCOUNTER — Encounter: Payer: Self-pay | Admitting: Internal Medicine

## 2021-06-10 ENCOUNTER — Other Ambulatory Visit: Payer: Self-pay

## 2021-06-10 ENCOUNTER — Ambulatory Visit (AMBULATORY_SURGERY_CENTER): Payer: Self-pay | Admitting: Internal Medicine

## 2021-06-10 VITALS — BP 118/68 | HR 60 | Temp 98.6°F | Resp 15 | Ht 62.0 in | Wt 183.0 lb

## 2021-06-10 DIAGNOSIS — Z1211 Encounter for screening for malignant neoplasm of colon: Secondary | ICD-10-CM

## 2021-06-10 DIAGNOSIS — K5140 Inflammatory polyps of colon without complications: Secondary | ICD-10-CM

## 2021-06-10 DIAGNOSIS — D125 Benign neoplasm of sigmoid colon: Secondary | ICD-10-CM

## 2021-06-10 MED ORDER — SODIUM CHLORIDE 0.9 % IV SOLN: 500.0000 mL | INTRAVENOUS | Status: DC

## 2021-06-10 NOTE — Progress Notes (Signed)
Report to PACU, RN, vss, BBS= Clear.  

## 2021-06-10 NOTE — Op Note (Signed)
[facility] Patient Name: Elaine Burch Procedure Date: 06/10/2021 10:56 AM MRN: [id number] Endoscopist: Gatha Mayer , MD Age: 55 Referring MD:  Date of Birth: [birth year] Gender: Female Account #: [id number] Procedure:                Colonoscopy Indications:              Screening for colorectal malignant neoplasm, This                            is the patient's first colonoscopy Medicines:                Propofol per Anesthesia, Monitored Anesthesia Care Procedure:                Pre-Anesthesia Assessment:                           - Prior to the procedure, a History and Physical                            was performed, and patient medications and                            allergies were reviewed. The patient's tolerance of                            previous anesthesia was also reviewed. The risks                            and benefits of the procedure and the sedation                            options and risks were discussed with the patient.                            All questions were answered, and informed consent                            was obtained. Prior Anticoagulants: The patient has                            taken no previous anticoagulant or antiplatelet                            agents. ASA Grade Assessment: II - A patient with                            mild systemic disease. After reviewing the risks                            and benefits, the patient was deemed in                            satisfactory condition to undergo the procedure.  After obtaining informed consent, the colonoscope                            was passed under direct vision. Throughout the                            procedure, the patient's blood pressure, pulse, and                            oxygen saturations were monitored continuously. The                            PCF-HQ190L Colonoscope was introduced through the                             anus and advanced to the the cecum, identified by                            appendiceal orifice and ileocecal valve. The                            colonoscopy was performed without difficulty. The                            patient tolerated the procedure well. The quality                            of the bowel preparation was excellent. The bowel                            preparation used was Plenvu via split dose                            instruction. Scope In: 11:06:30 AM Scope Out: 11:21:32 AM Scope Withdrawal Time: 0 hours 12 minutes 39 seconds  Total Procedure Duration: 0 hours 15 minutes 2 seconds  Findings:                 The perianal and digital rectal examinations were                            normal.                           A diminutive polyp was found in the sigmoid colon.                            The polyp was sessile. The polyp was removed with a                            cold snare. Resection and retrieval were complete.                            Verification of patient identification for the  specimen was done. Estimated blood loss was minimal.                           Multiple diverticula were found in the sigmoid                            colon, ascending colon and cecum.                           The exam was otherwise without abnormality on                            direct and retroflexion views. Complications:            No immediate complications. Estimated Blood Loss:     Estimated blood loss was minimal. Impression:               - One diminutive polyp in the sigmoid colon,                            removed with a cold snare. Resected and retrieved.                           - Diverticulosis in the sigmoid colon, in the                            ascending colon and in the cecum.                           - The examination was otherwise normal on direct                            and retroflexion  views. Recommendation:           - Patient has a contact number available for                            emergencies. The signs and symptoms of potential                            delayed complications were discussed with the                            patient. Return to normal activities tomorrow.                            Written discharge instructions were provided to the                            patient.                           - Resume previous diet.                           - Continue present medications.                           -  Await pathology results.                           - Repeat colonoscopy is recommended. The                            colonoscopy date will be determined after pathology                            results from today's exam become available for                            review. Gatha Mayer, MD 06/10/2021 11:26:34 AM This report has been signed electronically.

## 2021-06-10 NOTE — Progress Notes (Signed)
[facility] History and Physical   Primary Care Physician:  Camillia Herter, NP   Reason for Procedure:   Colon cancer screening  Plan:    colonoscopy     HPI: Elaine Burch is a 55 y.o. female here for a screening colonoscopy procedure.   Past Medical History:  Diagnosis Date   Arthritis    beginning   Diabetes mellitus without complication (HCC)    GERD (gastroesophageal reflux disease)    past hx of GERD   Hyperlipidemia     Past Surgical History:  Procedure Laterality Date   NO PAST SURGERIES      Prior to Admission medications   Medication Sig Start Date End Date Taking? Authorizing Provider  atorvastatin (LIPITOR) 20 MG tablet Take 1 tablet (20 mg total) by mouth daily. 02/24/21 02/24/22 Yes Nicolette Bang, MD  glipiZIDE (GLUCOTROL) 5 MG tablet Take 1 tablet (5 mg total) by mouth daily before breakfast. 02/24/21  Yes Nicolette Bang, MD  glucose blood (TRUE METRIX BLOOD GLUCOSE TEST) test strip USE AS INSTRUCTED TO CHECK BLOOD SUGAR 2-3 TIMES 04/10/21 04/10/22 Yes Minette Brine, Amy J, NP  metFORMIN (GLUCOPHAGE) 500 MG tablet Take 2 tablets (1,000 mg total) by mouth 2 (two) times daily with a meal. 02/24/21 02/24/22 Yes Nicolette Bang, MD  TRUEplus Lancets 28G MISC USE TO CHECK BLOOD SUGAR 2-3 TIMES DAILY. 12/15/20 12/15/21 Yes McClung, Dionne Bucy, PA-C  baclofen (LIORESAL) 10 MG tablet Take 0.5-1 tablets (5-10 mg total) by mouth 3 (three) times daily as needed for muscle spasms. Patient not taking: No sig reported 04/13/21   Hilts, Michael, MD  Blood Glucose Monitoring Suppl (TRUE METRIX METER) w/Device KIT Use to check blood sugars 2-3 times per day 08/09/19   Fulp, Cammie, MD  cyclobenzaprine (FLEXERIL) 5 MG tablet Take 1 tablet (5 mg total) by mouth 3 (three) times daily as needed for muscle spasms. Patient not taking: Reported on 06/10/2021 01/15/21   Argentina Donovan, PA-C  ibuprofen (ADVIL) 600 MG tablet Take 1 tablet (600 mg  total) by mouth every 8 (eight) hours as needed for moderate pain. Take after eating Patient not taking: No sig reported 01/15/21   Argentina Donovan, PA-C  meloxicam (MOBIC) 15 MG tablet Take 0.5-1 tablets (7.5-15 mg total) by mouth daily as needed for pain. Patient not taking: Reported on 06/10/2021 04/13/21   Hilts, Legrand Como, MD    Current Outpatient Medications  Medication Sig Dispense Refill   atorvastatin (LIPITOR) 20 MG tablet Take 1 tablet (20 mg total) by mouth daily. 90 tablet 1   glipiZIDE (GLUCOTROL) 5 MG tablet Take 1 tablet (5 mg total) by mouth daily before breakfast. 90 tablet 1   glucose blood (TRUE METRIX BLOOD GLUCOSE TEST) test strip USE AS INSTRUCTED TO CHECK BLOOD SUGAR 2-3 TIMES 100 strip 9   metFORMIN (GLUCOPHAGE) 500 MG tablet Take 2 tablets (1,000 mg total) by mouth 2 (two) times daily with a meal. 180 tablet 1   TRUEplus Lancets 28G MISC USE TO CHECK BLOOD SUGAR 2-3 TIMES DAILY. 100 each 11   baclofen (LIORESAL) 10 MG tablet Take 0.5-1 tablets (5-10 mg total) by mouth 3 (three) times daily as needed for muscle spasms. (Patient not taking: No sig reported) 30 each 3   Blood Glucose Monitoring Suppl (TRUE METRIX METER) w/Device KIT Use to check blood sugars 2-3 times per day 1 kit 0   cyclobenzaprine (FLEXERIL) 5 MG tablet Take 1 tablet (5 mg total) by mouth 3 (three)  times daily as needed for muscle spasms. (Patient not taking: Reported on 06/10/2021) 60 tablet 0   ibuprofen (ADVIL) 600 MG tablet Take 1 tablet (600 mg total) by mouth every 8 (eight) hours as needed for moderate pain. Take after eating (Patient not taking: No sig reported) 60 tablet 2   meloxicam (MOBIC) 15 MG tablet Take 0.5-1 tablets (7.5-15 mg total) by mouth daily as needed for pain. (Patient not taking: Reported on 06/10/2021) 30 tablet 6   Current Facility-Administered Medications  Medication Dose Route Frequency Provider Last Rate Last Admin   0.9 %  sodium chloride infusion  500 mL Intravenous Continuous  Gatha Mayer, MD        Allergies as of 06/10/2021   (No Known Allergies)    Family History  Problem Relation Age of Onset   Diabetes Mother    Cancer Father    Diabetes Brother    Breast cancer Neg Hx    Colon cancer Neg Hx    Colon polyps Neg Hx    Esophageal cancer Neg Hx    Rectal cancer Neg Hx    Stomach cancer Neg Hx     Social History   Socioeconomic History   Marital status: Married    Spouse name: Not on file   Number of children: 4   Years of education: Not on file   Highest education level: 6th grade  Occupational History   Not on file  Tobacco Use   Smoking status: Never   Smokeless tobacco: Never  Vaping Use   Vaping Use: Never used  Substance and Sexual Activity   Alcohol use: No   Drug use: No   Sexual activity: Yes     Review of Systems:  All other review of systems negative except as mentioned in the HPI.  Physical Exam: Vital signs BP 109/68   Pulse 70   Temp 98.6 F (37 C) (Temporal)   Ht _0  (1.575 m)   Wt 183 lb (83 kg)   SpO2 96%   BMI 33.47 kg/m   General:   Alert,  Well-developed, well-nourished, pleasant and cooperative in NAD Lungs:  Clear throughout to auscultation.   Heart:  Regular rate and rhythm; no murmurs, clicks, rubs,  or gallops. Abdomen:  Soft, nontender and nondistended. Normal bowel sounds.   Neuro/Psych:  Alert and cooperative. Normal mood and affect. A and O x 3   _1  E. Carlean Purl, MD, [facility] [phone] (pager) 06/10/2021 10:58 AM@

## 2021-06-10 NOTE — Progress Notes (Signed)
Interpreter used today at the [facility] for this pt.  Interpreter's name is- Joaquim Lai

## 2021-06-10 NOTE — Progress Notes (Signed)
Called to room to assist during endoscopic procedure.  Patient ID and intended procedure confirmed with present staff. Received instructions for my participation in the procedure from the performing physician.  

## 2021-06-10 NOTE — Progress Notes (Signed)
Pt's states no medical or surgical changes since previsit or office visit. 

## 2021-06-10 NOTE — Patient Instructions (Addendum)
I found and removed one tiny polyp that looks benign (not cancer).  You also have a condition called diverticulosis - common and not usually a problem. Please read the handout provided.   I will let you know pathology results and when to have another routine colonoscopy by mail and/or My Chart.  I appreciate the opportunity to care for you. Gatha Mayer, MD, [facility]  Handouts provided on polyps and diverticulosis.   USTED TUVO UN PROCEDIMIENTO ENDOSCPICO HOY EN EL [facility]:   Lea el informe del procedimiento que se le entreg para cualquier pregunta especfica sobre lo que se Primary school teacher.  Si el informe del examen no responde a sus preguntas, por favor llame a su gastroenterlogo para aclararlo.  Si usted solicit que no se le den [employer] de lo que se Estate manager/land agent en su procedimiento al [employer] va a cuidar, entonces el informe del procedimiento se ha incluido en un sobre sellado para que usted lo revise despus cuando le sea ms conveniente.   LO QUE PUEDE ESPERAR: Algunas sensaciones de hinchazn en el abdomen.  Puede tener ms gases de lo normal.  El caminar puede ayudarle a eliminar el aire que se le puso en el tracto gastrointestinal durante el procedimiento y reducir la hinchazn.  Si le hicieron una endoscopia inferior (como una colonoscopia o una sigmoidoscopia flexible), podra notar manchas de sangre en las heces fecales o en el papel higinico.  Si se someti a una preparacin intestinal para su procedimiento, es posible que no tenga una evacuacin intestinal normal durante [employer].   Tenga en cuenta:  Es posible que note un poco de irritacin y congestin en la nariz o algn drenaje.  Esto es debido al oxgeno [employer] durante su procedimiento.  No hay que preocuparse y esto debe desaparecer ms o Scientist, research (medical).   SNTOMAS PARA REPORTAR INMEDIATAMENTE:  Despus de una endoscopia inferior (colonoscopia o sigmoidoscopia flexible):   Cantidades excesivas de sangre en las heces fecales  Sensibilidad significativa o empeoramiento de los dolores abdominales   Hinchazn aguda del abdomen que antes no tena   Fiebre de 100F o ms    Para asuntos urgentes o de Freight forwarder, puede comunicarse con un gastroenterlogo a cualquier hora llamando al [phone].  DIETA:  Recomendamos una comida pequea al principio, pero luego puede continuar con su dieta normal.  Tome muchos lquidos, Teacher, adult education las bebidas alcohlicas durante 24 horas.    ACTIVIDAD:  Debe planear tomarse las cosas con calma por el resto del da y no debe CONDUCIR ni usar maquinaria pesada Programmer, applications (debido a los medicamentos de sedacin utilizados durante el examen).     SEGUIMIENTO: Nuestro personal llamar al nmero que aparece en su historial al siguiente da hbil de su procedimiento para ver cmo se siente y para responder cualquier pregunta o inquietud que pueda tener con respecto a la informacin que se le dio despus del procedimiento. Si no podemos contactarle, le dejaremos un mensaje.  Sin embargo, si se siente bien y no tiene Paediatric nurse, no es necesario que nos devuelva la llamada.  Asumiremos que ha regresado a sus actividades diarias normales sin incidentes. Si se le tomaron algunas biopsias, le contactaremos por telfono o por carta en las prximas 3 semanas.  Si no ha sabido [employer] biopsias en el transcurso de 3 semanas, por favor llmenos al [phone].   FIRMAS/CONFIDENCIALIDAD: Usted y/o el acompaante que le cuide han [place]  documentos que se ingresarn en su historial mdico electrnico.  Estas firmas atestiguan el hecho de que la informacin anterior      YOU HAD AN ENDOSCOPIC PROCEDURE TODAY AT [facility]:   Refer to the procedure report that was given to you for any specific questions about what was found during the examination.  If the procedure report does not answer your questions, please call  your gastroenterologist to clarify.  If you requested that your care partner not be given the details of your procedure findings, then the procedure report has been included in a sealed envelope for you to review at your convenience later.  YOU SHOULD EXPECT: Some feelings of bloating in the abdomen. Passage of more gas than usual.  Walking can help get rid of the air that was put into your GI tract during the procedure and reduce the bloating. If you had a lower endoscopy (such as a colonoscopy or flexible sigmoidoscopy) you may notice spotting of blood in your stool or on the toilet paper. If you underwent a bowel prep for your procedure, you may not have a normal bowel movement for a few days.  Please Note:  You might notice some irritation and congestion in your nose or some drainage.  This is from the oxygen used during your procedure.  There is no need for concern and it should clear up in a day or so.  SYMPTOMS TO REPORT IMMEDIATELY:  Following lower endoscopy (colonoscopy or flexible sigmoidoscopy):  Excessive amounts of blood in the stool  Significant tenderness or worsening of abdominal pains  Swelling of the abdomen that is new, acute  Fever of 100F or higher  For urgent or emergent issues, a gastroenterologist can be reached at any hour by calling [phone]. Do not use MyChart messaging for urgent concerns.    DIET:  We do recommend a small meal at first, but then you may proceed to your regular diet.  Drink plenty of fluids but you should avoid alcoholic beverages for 24 hours.  ACTIVITY:  You should plan to take it easy for the rest of today and you should NOT DRIVE or use heavy machinery until tomorrow (because of the sedation medicines used during the test).    FOLLOW UP: Our staff will call the number listed on your records 48-72 hours following your procedure to check on you and address any questions or concerns that you may have regarding the information given to you  following your procedure. If we do not reach you, we will leave a message.  We will attempt to reach you two times.  During this call, we will ask if you have developed any symptoms of COVID 19. If you develop any symptoms (ie: fever, flu-like symptoms, shortness of breath, cough etc.) before then, please call [phone].  If you test positive for Covid 19 in the 2 weeks post procedure, please call and report this information to [place].    If any biopsies were taken you will be contacted by phone or by letter within the next 1-3 weeks.  Please call us at [phone] if you have not heard about the biopsies in 3 weeks.    SIGNATURES/CONFIDENTIALITY: You and/or your care partner have signed paperwork which will be entered into your electronic medical record.  These signatures attest to the fact that that the information above on your After Visit Summary has been reviewed and is understood.  Full responsibility of the confidentiality of this discharge information  lies with you and/or your care-partner.

## 2021-06-11 ENCOUNTER — Ambulatory Visit: Payer: Self-pay | Admitting: Physician Assistant

## 2021-06-12 ENCOUNTER — Telehealth: Payer: Self-pay

## 2021-06-12 ENCOUNTER — Telehealth: Payer: Self-pay | Admitting: *Deleted

## 2021-06-12 NOTE — Telephone Encounter (Signed)
  Follow up Call-  Call back number 06/10/2021  Post procedure Call Back phone  # [phone]  Permission to leave phone message Yes  Some recent data might be hidden     Patient questions:  Do you have a fever, pain , or abdominal swelling? No. Pain Score  0 *  Have you tolerated food without any problems? Yes.    Have you been able to return to your normal activities? Yes.    Do you have any questions about your discharge instructions: Diet   No. Medications  No. Follow up visit  No.  Do you have questions or concerns about your Care? No.  Actions: * If pain score is 4 or above: No action needed, pain <4.

## 2021-06-12 NOTE — Telephone Encounter (Signed)
Attempted to reach patient for post-procedure follow-up call. No answer. Left voicemail.

## 2021-06-15 ENCOUNTER — Other Ambulatory Visit: Payer: Self-pay

## 2021-06-19 ENCOUNTER — Encounter: Payer: Self-pay | Admitting: Internal Medicine

## 2021-06-22 ENCOUNTER — Ambulatory Visit (INDEPENDENT_AMBULATORY_CARE_PROVIDER_SITE_OTHER): Payer: Self-pay | Admitting: Nurse Practitioner

## 2021-06-22 ENCOUNTER — Ambulatory Visit: Payer: Self-pay | Admitting: Physician Assistant

## 2021-06-22 ENCOUNTER — Encounter: Payer: Self-pay | Admitting: Nurse Practitioner

## 2021-06-22 ENCOUNTER — Other Ambulatory Visit: Payer: Self-pay

## 2021-06-22 DIAGNOSIS — E119 Type 2 diabetes mellitus without complications: Secondary | ICD-10-CM

## 2021-06-22 DIAGNOSIS — U071 COVID-19: Secondary | ICD-10-CM

## 2021-06-22 MED ORDER — MOLNUPIRAVIR 200 MG PO CAPS
4.0000 | ORAL_CAPSULE | Freq: Two times a day (BID) | ORAL | 0 refills | Status: AC
Start: 2021-06-22 — End: 2021-06-27
  Filled 2021-06-22: qty 40, 5d supply, fill #0

## 2021-06-22 NOTE — Progress Notes (Signed)
Virtual Visit via Telephone Note  I connected with Elaine Burch on 06/22/21 at 11:10 AM EDT by telephone and verified that I am speaking with the correct person using two identifiers.  Location: Patient: home Provider: office   I discussed the limitations, risks, security and privacy concerns of performing an evaluation and management service by telephone and the availability of in person appointments. I also discussed with the patient that there may be a patient responsible charge related to this service. The patient expressed understanding and agreed to proceed.   History of Present Illness:  Patient presents today for televisit for COVID.  Spanish interpreter used for this visit.  Patient states that she tested positive this past Saturday.  Symptoms started this past Tuesday.  Patient is on day 5 of symptoms.  Patient states that she is still symptomatic with flulike/COVID-like symptoms.  She denies any significant shortness of breath or any significant fever.  We discussed that we can order molnupiravir, but she will need to start this today.  Patient does need a return to work note.  She would like to return to work this Thursday.     Observations/Objective:  Vitals with BMI 06/10/2021 06/10/2021 06/10/2021  Height - - -  Weight - - -  BMI - - -  Systolic [phone]  Diastolic 68 63 65  Pulse 60 61 63     Assessment and Plan:  Covid 19:   Stay well hydrated  Stay active  Deep breathing exercises   May take tylenol for fever or pain  You are being prescribed MOLNUPIRAVIR for COVID-19 infection.   Patient Instructions  Covid 19:   Stay well hydrated  Stay active  Deep breathing exercises   May take tylenol for fever or pain  You are being prescribed MOLNUPIRAVIR for COVID-19 infection.     [facility] and [facility] ([place], [place], [place] #[phone])  Monday through Friday 8a-5:30p   Please call the  pharmacy or go through the drive through vs going inside if you are picking up the mediation yourself to prevent further spread. If prescribed to a [facility] affiliated pharmacy, a pharmacist will bring the medication out to your car.   ADMINISTRATION INSTRUCTIONS: Take with or without food. Swallow the tablets whole. Don't chew, crush, or break the medications because it might not work as well  For each dose of the medication, you should be taking FOUR tablets at one time, TWICE a day   Finish your full five-day course of Molnupiravir even if you feel better before you're done. Stopping this medication too early can make it less effective to prevent severe illness related to [place].    Molnupiravir is prescribed for YOU ONLY. Don't share it with others, even if they have similar symptoms as you. This medication might not be right for everyone.   Make sure to take steps to protect yourself and others while you're taking this medication in order to get well soon and to prevent others from getting sick with COVID-19.   **If you are of childbearing potential (any gender) - it is advised to not get pregnant while taking this medication and recommended that condoms are used for female partners the next 3 months after taking the medication out of extreme caution    COMMON SIDE EFFECTS: Diarrhea Nausea  Dizziness    If your COVID-19 symptoms get worse, get medical help right away. Call 911 if you experience symptoms such as worsening cough,  trouble breathing, chest pain that doesn't go away, confusion, a hard time staying awake, and pale or blue-colored skin. This medication won't prevent all COVID-19 cases from getting worse.      Follow up:  Follow up in if needed  COVID-19: Cuarentena y aislamiento COVID-19: Quarantine and Art therapist cuarentena Si estuvo expuesto [place] cuarentena y [place] de otras personas cuando haya estado en contacto estrecho con alguien que  tiene COVID-19. Aslese Si est enfermo o el resultado de la prueba es positivo Aslese cuando est enfermo o cuando tenga COVID-19, aunque no tenga sntomas. Cundo quedarse en casa Cmo calcular la cuarentena La fecha de la exposicin se considera el da 0. El da 1 es Engineer, manufacturing systems completo despus de su ltimo contacto con una persona que tuvo COVID-19. Foy [place] en casa y [place] de otras personas durante al menos 5 das. Descubra por [employer] for Disease Control and Prevention Librarian, academic) (Centros para Building surveyor y la Prevencin de Arboriculturist) han actualizado las pautas para el pblico general. SI USTED se expuso al COVID-19 y NO est  al daSI USTED se expuso al COVID-19 y NO est con las vacunas contra el COVID-19 Haga cuarentena durante al menos 5 [place] en su [place] en su casa y haga cuarentena durante al menos 5 das completos. Use una mascarilla bien ajustada si debe estar cerca de otras personas en su casa. No viaje. Somtase a pruebas de Chief Operating Officer no presente sntomas, hgase la prueba al menos 5 das despus de haber tenido el ltimo contacto estrecho con una persona con COVID-19. Despus de la cuarentena Est atento a los sntomas Est atento a los sntomas hasta 10 das despus de la ltima vez que haya tenido contacto estrecho con una persona con COVID-19. Evite viajar Es Media planner 10 das despus de la ltima vez que tuvo contacto estrecho con una persona con COVID-19. Si presenta sntomas Aslese de inmediato y hgase la prueba. Contine quedndose en su casa hasta que [employer]. Use una mascarilla bien ajustada cuando est con [employer]. Tome precauciones [employer] da 10 Use una mascarilla bien ajustada Use una mascarilla bien ajustada durante 10 das completos en todo momento en que est cerca de otras personas dentro de su casa o en pblico. No vaya a lugares donde no pueda usar L[employer]ell [employer]. Si  debe Stanley 6 y 35, tome precauciones. Evite estar cerca de personas que tengan ms probabilidades de enfermarse de COVID-19. SI USTED se expuso al COVID-19 y est  al daSI USTED se expuso al COVID-19 y est con las vacunas contra el COVID-19 No haga cuarentena No es necesario que se quede en su casa, a menos que presente sntomas. Somtase a pruebas de Chief Operating Officer no presente sntomas, hgase la prueba al menos 5 das despus de haber tenido el ltimo contacto estrecho con una persona con COVID-19. Est atento a los sntomas Est atento a los sntomas hasta 10 das despus de la ltima vez que haya tenido contacto estrecho con una persona con COVID-19. Si presenta sntomas Aslese de inmediato y hgase la prueba. Contine quedndose en su casa hasta que [employer]. Use una mascarilla bien ajustada cuando est con [employer]. Tome precauciones Goldman Sachs da 10 Use una mascarilla bien ajustada Use una mascarilla bien ajustada durante 10 das completos en todo momento en que est cerca de otras personas dentro de su casa o en pblico. No vaya  a lugares donde no pueda usar [employer]. Tome precauciones si viaja Evite estar cerca de personas que tengan ms probabilidades de enfermarse de COVID-19. SI USTED se expuso al COVID-19 y tuvo COVID-19 confirmado en los ltimos [address] (tuvo un resultado positivo en la prueba viral) No haga cuarentena No es necesario que se quede en su casa, a menos que presente sntomas. Est atento a los sntomas Est atento a los sntomas hasta 10 das despus de la ltima vez que haya tenido contacto estrecho con una persona con COVID-19. Si presenta sntomas Aslese de inmediato y hgase la prueba. Contine quedndose en su casa hasta que [employer]. Use una mascarilla bien ajustada cuando est con [employer]. Tome precauciones Goldman Sachs da 10 Use una mascarilla bien ajustada Use una mascarilla  bien ajustada durante 10 das completos en todo momento en que est cerca de otras personas dentro de su casa o en pblico. No vaya a lugares donde no pueda usar [employer]. Tome precauciones si viaja Evite estar cerca de personas que tengan ms probabilidades de enfermarse de COVID-19. Cmo calcular el [place] 0 es Engineer, manufacturing systems en que tiene sntomas u obtiene un resultado positivo en la prueba viral. El da 1 es Engineer, manufacturing systems completo despus de que aparecen los sntomas o de que le toman la muestra para la prueba. Si tiene COVID-19 o tiene sntomas, aslese durante al menos 5 das. SI USTED tuvo resultado positivo de COVID-19 o tiene sntomas, independientemente de su estado respecto de la vacunacin [place] en su [place] en su casa durante 5 das y aslese de las dems personas que vivan en su casa. Use una mascarilla bien ajustada si debe estar cerca de otras personas en su casa. No viaje. Finalizacin del aislamiento si tuvo sntomas Finalice el aislamiento despus de 5 das completos si no tiene fiebre durante 24 horas (sin usar medicamentos para Primary school teacher) y sus sntomas estn mejorando. Finalizacin del aislamiento si NO tuvo sntomas Finalice el aislamiento cuando hayan pasado al menos 5 das completos despus del resultado positivo en la prueba. Si se enferma de gravedad de COVID-19 o tiene un sistema inmunitario debilitado Debe aislarse durante al [employer] 10 das. Consulte a su mdico antes de Barista. Tome precauciones Goldman Sachs da 10 Use una mascarilla bien ajustada Use una mascarilla bien ajustada durante 10 das completos en todo momento en que est cerca de otras personas dentro de su casa o en pblico. No vaya a lugares donde no pueda usar [employer]. No viaje No viaje hasta 10 das despus del inicio de sus sntomas o hasta la fecha en que se le realiz la prueba que dio resultado  positivo si no tuvo sntomas. Evite estar cerca de personas que tengan ms probabilidades de enfermarse de COVID-19. Definiciones Exposicin Contacto con una persona infectada por el SARS-CoV-2, el virus que causa el COVID-19, de manera tal que aumenta la probabilidad de infectarse por el virus. Contacto estrecho Se considera contacto estrecho a la persona que se haya encontrado a menos de 6 pies de distancia de una persona infectada (confirmada por laboratorio o con diagnstico clnico) durante un total acumulado de 15 minutos o ms durante un perodo de 24 horas. Por ejemplo, tres exposiciones individuales de 5 minutos conforman un total de 15 minutos. Las personas que estn expuestas a alguien con COVID-19 despus de haber completado al menos 5  das de aislamiento no se consideran contactos estrechos. Haga cuarentena La cuarentena es una estrategia que se utiliza para prevenir la transmisin del COVID-19 al Cisco separadas de los dems a las personas que han estado en contacto estrecho con alguien con [id number]. Quin no necesita hacer cuarentena? Si tuvo contacto estrecho con una persona con COVID-19 y est en uno de los siguientes grupos, no necesita Patent attorney. Est al da con las vacunas contra el COVID-19. Tuvo COVID-19 confirmado en los ltimos [address] (lo que significa que obtuvo un resultado positivo en una prueba viral). Si est al da con las vacunas contra el COVID-19, debe usar una mascarilla bien ajustada cuando est con otras personas durante 10 das a partir de la fecha de ltimo contacto estrecho con una persona con COVID-19 (la fecha del ltimo contacto estrecho se considera el da 0). Hgase la prueba al menos 5 das despus de haber tenido el ltimo contacto estrecho con una persona con COVID-19. Si da positivo o presenta sntomas de COVID-19, aslese de las dems personas y siga las recomendaciones de la seccin Aislamiento a continuacin. Si obtuvo un resultado positivo  de COVID-19 en una prueba viral en los ltimos 33 das y posteriormente se recuper y Scientist, research (medical) sin sntomas de COVID-19, no es necesario que haga cuarentena ni que se haga la prueba despus de un contacto estrecho. Debe usar Geographical information systems officer bien ajustada cuando est con otras personas durante 10 das a partir de la fecha de ltimo contacto estrecho con una persona con COVID-19 (la fecha del ltimo contacto estrecho se considera el da 0). Si tiene sntomas de COVID-19, hgase la prueba, aslese de [employer] y siga las recomendaciones de la seccin Aislamiento a continuacin. [place] deben hacer cuarentena? Si est en contacto estrecho con una persona con COVID-19, debe hacer cuarentena si no est al da con las vacunas contra el COVID-19. Esto incluye a las personas que no estn vacunadas. Qu hacer durante la cuarentena [place] en su casa y mantngase alejado [place] al menos 5 das (del da 0 al da 5) despus del ltimo contacto con Ardelia Mems persona con COVID-19. La fecha de la exposicin se considera el da 0. Use una mascarilla bien ajustada cuando est cerca de otras personas en su casa, si es posible. [place] 10 das despus de su ltimo contacto estrecho con una persona con COVID-19, est atento a si presenta fiebre (100.4 F o ms), tos, dificultad para respirar u otros sntomas de COVID-19. Si presenta sntomas, hgase la prueba inmediatamente y aslese hasta que reciba el resultado. Si el resultado es positivo, siga las recomendaciones de [place]. Si no presenta sntomas, hgase la prueba al menos 5 das despus de haber tenido el ltimo contacto estrecho con una persona con COVID-19. Si el resultado de la prueba es negativo, puede salir de Medical illustrator, [place] siga usando una mascarilla bien ajustada cuando est cerca de otras personas en su casa y en pblico hasta 10 das despus de su ltimo contacto estrecho con una persona con COVID-19. Si el resultado es positivo,  debe aislarse durante al menos 5 das a partir de la fecha de la prueba positiva (si no tiene sntomas). Si presenta sntomas de COVID-19, aslese durante al menos 5 das desde la fecha en que comenzaron los sntomas (la fecha en que comenzaron los sntomas es el da 0). Siga las recomendaciones de la seccin Aislamiento a continuacin. Si no puede hacerse una prueba 5 das despus del ltimo contacto  estrecho con una persona con COVID-19, puede salir de su casa despus del da 5 si no ha tenido sntomas de COVID-19 en todo el perodo de 5 das. Use una mascarilla bien ajustada durante 10 das despus de la fecha del ltimo contacto estrecho cuando est con otras personas en su casa y en pblico. Evite a las personas con un sistema inmunitario debilitado o con ms probabilidades de enfermarse gravemente de COVID-19, y Art therapist las residencias de Mining engineer y otros entornos de alto riesgo, Nurse, children's despus de al menos 10 das. Si es posible, aljese de las personas con las que vive, Financial risk analyst de las personas que tienen un mayor riesgo de Gaffer gravemente por COVID-19, as como de otras personas fuera de su casa, durante los 10 das completos posteriores a su ltimo contacto estrecho con una persona con COVID-19. Si no puede Patent attorney, debe usar [employer] bien ajustada durante 10 das cuando est cerca de otras personas en su casa y en pblico. Si no puede usar Geographical information systems officer cuando est cerca de otras personas, debe Clinical biochemist en cuarentena durante 10 das. Evite a las personas con un sistema inmunitario debilitado o con ms probabilidades de enfermarse gravemente de COVID-19, y evite las residencias de ancianos y otros entornos de alto riesgo, Nurse, children's despus de al menos 10 das. Consulte informacin adicional sobre viajes. No vaya a lugares donde no pueda usar mascarilla, como restaurantes y algunos gimnasios, y evite comer cerca de Producer, television/film/video en su casa y en el trabajo hasta 10 das despus de  su ltimo contacto estrecho con una persona con COVID-19. Despus de la cuarentena Est atento a la aparicin de sntomas hasta 10 das despus de su ltimo contacto estrecho con una persona con COVID-19. Si tiene sntomas, aslese inmediatamente y hgase la prueba. Cuarentena en entornos con congregacin de alto riesgo En ciertos entornos con congregacin en los que existe un alto riesgo de transmisin secundaria (como correccionales y centros penitenciarios, refugios para personas sin hogar o cruceros), los CDC recomiendan una cuarentena de 10 das para los residentes, independientemente de que hayan recibido o no la vacunacin y Water engineer. Durante los perodos de Administrator crtica de personal, los establecimientos pueden optar por acortar el perodo de cuarentena del personal con el fin de garantizar la continuidad de las operaciones. La decisin de acortar la cuarentena en estos entornos debe tomarse en consulta con los departamentos de salud estatales, locales, tribales o territoriales y Counselling psychologist en cuenta el contexto y las caractersticas del establecimiento. La gua especfica para cada entorno de los CDC proporciona recomendaciones adicionales para estos entornos. Aislamiento El aislamiento se [place] para separar a las personas con sospecha o confirmacin de COVID-19 de las personas sin COVID-19. Las personas que estn aisladas deben quedarse en su casa hasta que sea seguro que estn cerca de los dems. En casa, cualquier persona enferma o infectada debe separarse de los dems, o usar una mascarilla bien ajustada cuando necesite estar cerca de los dems. Las personas aisladas deben quedarse en una "habitacin de enfermos" o zona especfica y usar un bao separado si est disponible. Todas las personas que tengan COVID-19 presunto o confirmado deben quedarse en casa y Therapist, nutritional de las dems personas durante al menos 5 das completos (el da 0 es Engineer, manufacturing systems de sntomas o la fecha del da de la  prueba viral positiva para las personas asintomticas). Deben usar mascarilla cuando estn cerca de otras personas en su casa y en pblico durante 5 das ms.  Las personas con COVID-19 confirmado o que presentan sntomas de COVID-19 deben aislarse independientemente de su estado respecto de la vacunacin. Esto puede comprender lo siguiente: Personas con prueba viral positiva de COVID-19, independientemente de que tengan sntomas o no. Personas con sntomas de COVID-19, incluidas las personas que estn [employer] Corporation de la prueba o que no se han hecho la prueba. Las personas con sntomas deben aislarse aunque no sepan si han estado en contacto estrecho con alguien con COVID-19. Qu hacer durante el aislamiento Est atento a sus sntomas. Si tiene un signo de advertencia de emergencia (lo que incluye dificultad para respirar), busque atencin mdica de emergencia de manera inmediata. Si es posible, qudese en una habitacin separada de otros miembros del hogar. Use un bao aparte, si es posible. Si es posible, tome medidas para mejorar la ventilacin en su casa. Evite el contacto con los dems miembros del hogar y con las [place]. No comparta artculos de uso personal del hogar, como tazas, toallas y utensilios. Use una mascarilla bien ajustada cuando necesite estar cerca de otras personas. Obtenga ms informacin sobre qu hacer si est enfermo y cmo notificar a sus contactos. Finalizacin del aislamiento para personas que tuvieron COVID-19 con sntomas Si tuvo COVID-19 con sntomas, aslese durante al menos 5 das. Para calcular su perodo de aislamiento de 5 das, el da 0 es Engineer, manufacturing systems en que tuvo sntomas. El da 1 es Engineer, manufacturing systems completo despus de que aparecieron los sntomas. Puede salir del aislamiento despus de 5 das completos. Puede finalizar el aislamiento despus de 5 das completos si no tiene fiebre durante 24 horas sin el uso de medicamentos para bajar la fiebre y si  sus otros sntomas han mejorado (la prdida del gusto y Armed forces logistics/support/administrative officer puede persistir durante semanas o meses despus de la recuperacin y no es Leisure centre manager el final del aislamiento). Debe seguir usando una mascarilla bien ajustada cuando est cerca de otras personas en su casa y en pblico durante 5 das ms (del da 6 al da 10) despus del final de su perodo de aislamiento de 5 das. Si no puede usar Geographical information systems officer cuando est cerca de otras personas, Theatre manager en aislamiento durante 10 das completos. Evite a las personas con un sistema inmunitario debilitado o con ms probabilidades de enfermarse gravemente de COVID-19, y evite las residencias de ancianos y otros entornos de alto riesgo, Nurse, children's despus de al menos 10 das. Si sigue teniendo fiebre o sus otros sntomas no han mejorado despus de 5 das de [place], debe esperar para Financial risk analyst su aislamiento hasta que no tenga fiebre durante 24 horas sin el uso de medicamentos para Sports coach la fiebre y [place] que sus otros sntomas hayan mejorado. Contine usando una mascarilla bien ajustada hasta el da 10. Comunquese con su mdico si tiene alguna pregunta. Consulte informacin adicional sobre viajes. No vaya a lugares donde no pueda usar mascarilla, como restaurantes y algunos gimnasios, y evite comer cerca de Producer, television/film/video en su casa y en el trabajo hasta que hayan pasado 10 das completos despus del Passenger transport manager en que tuvo sntomas. Si la persona tiene Paramedic a una prueba y desea [place], el mejor enfoque es utilizar una prueba de antgenos1 [place] final del perodo de aislamiento de 5 das. Tome la [place] para la prueba nicamente si no tiene fiebre durante 24 horas sin el uso de medicamentos para bajar la fiebre y si sus otros sntomas han mejorado (la prdida del gusto y Photographer  olfato puede persistir durante semanas o meses despus de la recuperacin y no es necesario retrasar el final del aislamiento por esto). Si el resultado de la prueba  es positivo, debe continuar con el aislamiento Doctor, general practice 10. Si el resultado de la prueba es negativo, puede Barista, Armed forces training and education officer debe seguir usando una mascarilla bien ajustada cuando est cerca de otras personas en su casa y en pblico hasta el da 10. Siga las recomendaciones adicionales sobre el uso de Pescadero y sobre evitar viajar que se describen anteriormente. 1Como se indica en el etiquetado para las pruebas de antgenos sin receta autorizadas: [employer] deben tratarse como presuntos. Los Microsoft no descartan la infeccin por SARS-CoV-2 y no deben [employer] nica base para tomar decisiones de tratamiento o de manejo del paciente, incluidas las decisiones de control de la infeccin. Para mejorar los [place], las pruebas de antgenos deben [employer] veces en un perodo de tres das con un intervalo de al menos 24 horas y no ms de 48 horas entre pruebas. Tenga en cuenta que estas recomendaciones sobre la finalizacin del aislamiento no se aplican a Engineer, manufacturing con COVID-19 moderado o muy grave ni a personas con el sistema inmunitario debilitado. Consulte la seccin a continuacin para Therapist, music las recomendaciones sobre cundo finalizar el aislamiento para [place]. Finalizacin del aislamiento de personas que tuvieron resultado positivo en la prueba de COVID-19 pero no tuvieron sntomas Si tiene resultado positivo de COVID-19 y nunca presenta sntomas, aslese durante al menos 5 [place]. El da 0 es el da de la prueba viral positiva (segn la fecha en que se la hizo) y Games developer 1 es Engineer, manufacturing systems completo despus de la toma de la muestra para la prueba que dio resultado positivo. Puede salir del aislamiento despus de 5 das completos. Si sigue sin tener sntomas, puede finalizar el aislamiento despus de al menos 5 das. Debe seguir usando una mascarilla bien ajustada cuando est cerca de otras personas en su casa y en [place] 10 (del  da 6 al da 10). Si no puede usar Geographical information systems officer cuando est cerca de otras personas, Theatre manager en aislamiento durante 10 das. Evite a las personas con un sistema inmunitario debilitado o con ms probabilidades de enfermarse gravemente de COVID-19, y evite las residencias de ancianos y otros entornos de alto riesgo, Nurse, children's despus de al menos 10 das. Si presenta sntomas despus de recibir un resultado positivo, su perodo de aislamiento de 5 das debe comenzar de [place]. El da 0 es Engineer, manufacturing systems en que tiene sntomas. Siga las recomendaciones anteriores sobre la finalizacin del aislamiento de las personas que tuvieron COVID-19 con sntomas. Consulte informacin adicional sobre viajes. No vaya a lugares donde no pueda usar mascarilla, como restaurantes y algunos gimnasios, y Futures trader cerca de Producer, television/film/video en su casa y en el trabajo hasta que hayan pasado 10 das desde el da en que recibi el resultado positivo en la prueba. Si la persona tiene Paramedic a una prueba y desea [place], el mejor enfoque es utilizar una prueba de antgenos1 [place] final del perodo de aislamiento de 5 das. Si el resultado de la prueba es positivo, debe continuar con el aislamiento Doctor, general practice 10. Si el resultado de la prueba es positivo, tambin puede elegir realizarse la prueba diariamente y si el resultado es negativo, puede finalizar el aislamiento, Armed forces training and education officer continuar usando una mascarilla bien ajustada cuando est cerca de otras personas  en su casa y en [place] 10. Siga las recomendaciones adicionales sobre el uso de [place] y sobre evitar viajar que se describen anteriormente. 1Como se indica en el etiquetado para las pruebas de antgenos sin receta autorizadas: [employer] deben tratarse como presuntos. Los [employer] no descartan la infeccin por SARS-CoV-2 y no deben [employer] nica base para tomar decisiones de tratamiento o de manejo del paciente, incluidas las  decisiones de control de la infeccin. Para mejorar los [place], las pruebas de antgenos deben [employer] veces en un perodo de tres das con un intervalo de al menos 24 horas y no ms de 48 horas entre pruebas. Finalizacin del aislamiento para personas con COVID-19 moderado o muy grave, o con un sistema inmunitario debilitado Las personas moderadamente enfermas de COVID-19 (con sntomas que afecten a los pulmones, como falta de aire o dificultad para Ambulance person) deben aislarse durante 10 das y seguir todas las dems precauciones de aislamiento. Para calcular su perodo de aislamiento de [address], el da 0 es Engineer, manufacturing systems con sntomas. El da 1 es Engineer, manufacturing systems completo despus de que aparecieron los sntomas. Si no est seguro de si sus sntomas son moderados, hable con un mdico para obtener ms orientacin. Las personas que estn muy enfermas a causa del COVID-19 (esto significa personas que fueron hospitalizadas o necesitaron cuidados intensivos o asistencia de Animal nutritionist) y Engineer, manufacturing que tienen sistemas inmunitarios debilitados podran Marine scientist en casa durante ms tiempo. Es posible que tambin deban hacerse una prueba viral para determinar cundo podrn estar cerca de Producer, television/film/video. Los CDC recomiendan un perodo de Editor, commissioning de un mnimo de 10 das y un mximo de 45 das para las personas que estuvieron gravemente enfermas de COVID-19 y para las personas con el sistema inmunitario debilitado. Consulte a su mdico cundo Energy manager a Agricultural consultant de Producer, television/film/video. Si no est seguro de si sus sntomas son graves o si tiene el sistema inmunitario debilitado, hable con un mdico para obtener ms orientacin. Las Engineer, manufacturing con un sistema inmunitario debilitado deben hablar con el mdico sobre la posibilidad de respuestas inmunitarias reducidas a las vacunas contra el COVID-19 y la necesidad de Clinical biochemist con las medidas de Engineer, building services (lo que incluye usar una mascarilla bien  ajustada y Aeronautical engineer y espacios interiores mal ventilados) para protegerse contra el COVID-19 hasta que el mdico les indique lo contrario. Tambin se debe animar a los contactos estrechos de las personas inmunodeprimidas, incluidos los miembros del Museum/gallery curator, a recibir todas las dosis recomendadas de la vacuna contra el COVID-19 para contribuir a la proteccin de Social worker. Aislamiento en entornos con congregacin de alto riesgo En ciertos entornos con congregacin de alto riesgo que tienen un alto riesgo de transmisin secundaria y donde no es factible dividir a las Financial planner cohortes (como correccionales y Production assistant, radio, refugios para personas sin hogar y cruceros), los CDC recomiendan un perodo de aislamiento de 10 das para los residentes. Durante los perodos de Administrator crtica de personal, los establecimientos pueden optar por acortar el perodo de aislamiento del personal con el fin de Engineer, materials la continuidad de las operaciones. La decisin de Education administrator en estos entornos debe tomarse en consulta con los departamentos de salud estatales, locales, tribales o territoriales y Counselling psychologist en cuenta el contexto y las caractersticas del establecimiento. La gua especfica para cada entorno de los CDC proporciona recomendaciones adicionales para estos entornos. Esta gua de los CDC  est destinada a complementar (no a Training and development officer) cualquier ley, norma y reglamento federal, estatal, local, territorial o tribal sobre salud y [place]. Recomendaciones para entornos especficos Estas recomendaciones no se aplican a los profesionales de la atencin mdica. Para obtener instrucciones especficas para estos entornos, consulte Profesionales de la atencin mdica: Gua provisional para la gestin del personal de atencin mdica con infeccin por SARS-CoV-2 o exposicin al [employer], residentes y visitantes de entornos de atencin mdica: Recomendaciones provisionales  para la prevencin y el control de infecciones para el personal de atencin mdica durante la pandemia de la enfermedad por coronavirus de 2019 (COVID-19) Se encuentran disponibles recomendaciones y guas adicionales especficas de otros entornos. Estas recomendaciones sobre cuarentena y aislamiento se aplican a los entornos escolares desde el jardn de infantes hasta el 94. grado. Puede encontrar la gua adicional aqu: Descripcin general de la cuarentena por COVID-19 para escuelas de jardn de infantes hasta el 12. grado Viajeros: Informacin y recomendaciones de viaje Establecimientos y otros entornos con Database administrator de personas: pginas de gua para entornos comunitarios, laborales y escolares Preguntas frecuentes sobre la exposicin continua al COVID-19 Vivo con alguien con COVID-19, pero no puedo separarme de esa persona. Cmo manejamos la cuarentena en esta situacin? Es [employer] personas con COVID-19 permanezcan separadas de otras personas, si es posible, incluso si viven juntas. Si no es posible que la persona con COVID-19 se separe de las otras personas con las que vive, esas personas con las que convive tendrn exposicin continua, es decir que estarn expuestas repetidamente hasta que esa persona ya no pueda propagar el virus a Producer, television/film/video. En esta situacin, hay precauciones que puede tomar para limitar la propagacin del COVID-19: La persona con COVID-19 y todas las personas con las que viven deben usar una mascarilla bien ajustada dentro del Museum/gallery curator. Si es posible, una sola persona debe cuidar de la persona con COVID-19 para limitar la cantidad de personas que estn en contacto estrecho con la persona infectada. Tome medidas para protegerse y Museum/gallery curator a los dems para reducir la transmisin en el hogar: Pngase en cuarentena si no est al da con las vacunas contra el COVID-19. Aslese si est enfermo o tuvo un resultado positivo de COVID-19, aunque no tenga  sntomas. Obtenga ms informacin [employer] de salud pblica con respecto a las [place], el uso de Administrator y la cuarentena de los contactos estrechos, como usted, que tienen exposicin continua. Estas recomendaciones difieren dependiendo de su estado de vacunacin. Qu debo hacer si tengo una exposicin continua al COVID-19 por alguien con quien vivo? Las recomendaciones para esta situacin dependen de su estado de vacunacin: Si no est al da con las vacunas contra el COVID-19 y tiene exposicin continua al COVID-19, debe hacer lo siguiente: Iniciar la cuarentena inmediatamente y Corporate treasurer durante todo el perodo de aislamiento de la persona con COVID-19. Continuar la cuarentena durante 5 das ms a partir del da despus del final del aislamiento de la persona con COVID-19. Hacerse una prueba al menos 5 das despus del final del aislamiento de la persona infectada con Anne Ng. Si el resultado de la prueba es negativo, puede salir de su casa, Armed forces training and education officer debe seguir usando una mascarilla bien ajustada cuando est cerca de otras personas en su hogar y en pblico hasta 10 das despus del final del aislamiento de la persona con COVID-19. Aislarse inmediatamente si presenta sntomas de COVID-19 o si da positivo en la prueba. Si est al da con las vacunas contra el  COVID-19 y tiene exposicin continua al COVID-19, debe hacer lo siguiente: Hacerse una prueba al menos 5 das despus de su primera exposicin. Una persona con COVID-19 se considera infecciosa desde los 2 das anteriores a presentar sntomas, o 2 das antes de la fecha de la prueba positiva si no tiene sntomas. Hacerse una prueba de nuevo al menos 5 das despus del final del aislamiento de la persona con COVID-19. Usar una mascarilla bien ajustada cuando est con la persona con [id number], y hacerlo durante todo su perodo de aislamiento. Usar una mascarilla bien ajustada cuando est con otras personas durante 10 das  despus de que finalice el perodo de aislamiento de la persona infectada. Aislarse inmediatamente si presenta sntomas de COVID-19 o si da positivo en la prueba. Qu debo hacer si varias personas con las que vivo tienen un resultado positivo de COVID-19 en diferentes momentos? Las recomendaciones para esta situacin dependen de su estado de vacunacin: Si no est al da con las vacunas contra el COVID-19, debe hacer lo siguiente: Ponerse en cuarentena durante todo el perodo de aislamiento de cualquier persona infectada con IT trainer. Continuar la cuarentena hasta 5 das despus de la fecha de finalizacin del aislamiento de la ltima persona infectada que viva con usted. Por ejemplo, si el ltimo da de aislamiento de la ltima persona infectada con COVID-19 fue el 56 de junio, el nuevo perodo de [place] de [place] [address] [place]. Hacerse una prueba al menos 5 das despus del final del aislamiento de la ltima persona infectada con Anne Ng. Usar una mascarilla bien ajustada cuando est cerca de cualquier persona con COVID-19 mientras esa persona est aislada. Usar una mascarilla bien ajustada cuando est con otras personas hasta 10 das despus de su ltimo contacto estrecho. Aislarse inmediatamente si presenta sntomas de COVID-19 o si da positivo en la prueba. Si est al da con las vacunas contra el COVID-19, debe hacer lo siguiente: Filomena Jungling prueba al menos 5 das despus de su primera exposicin. Una persona con COVID-19 se considera infecciosa desde los 2 das anteriores a presentar sntomas, o 2 das antes de la fecha de la prueba positiva si no tiene sntomas. Hacerse una prueba de nuevo al menos 5 das despus del final del aislamiento de la ltima persona infectada con Anne Ng. Usar una mascarilla bien ajustada cuando est cerca de cualquier persona con COVID-19 mientras esa persona est aislada. Usar una mascarilla bien ajustada cuando est con otras personas  durante 10 das despus del final del aislamiento de la ltima persona infectada que viva con usted. Por ejemplo, si el ltimo da de aislamiento de la ltima persona infectada con COVID-19 fue el 35 de junio, el nuevo perodo de 10 das para usar una mascarilla bien ajustada en lugares pblicos cerrados comienza el 1 de julio. Aislarse inmediatamente si presenta sntomas de COVID-19 o si da positivo en la prueba. Tuve COVID-19 y termin el aislamiento. Tengo que ponerme en cuarentena o hacerme una prueba si alguien con quien vivo contrae COVID-19 poco despus de que termin el aislamiento? No. Si termin el aislamiento hace poco y alguien que vive con usted da positivo en la prueba del virus que causa COVID-19 poco despus del final de su perodo de aislamiento, no tiene que ponerse en cuarentena ni hacerse una prueba siempre que no presente nuevos sntomas. Una vez que todas las personas que viven juntas hayan terminado el aislamiento o la cuarentena, consulte las siguientes directrices acerca de nuevas  exposiciones al COVID-19. Si tuvo COVID-19 en los [address] anteriores y luego estuvo en contacto estrecho con alguien con COVID-19, no tiene que ponerse en cuarentena ni hacerse una prueba si no tiene sntomas. Sin embargo, Estate manager/land agent lo siguiente: Usar Manufacturing engineer en interiores en pblico durante 10 das despus de la fecha del ltimo Loss adjuster, chartered. Vigilar los sntomas de COVID-19 durante 10 das a partir de la fecha de su ltimo contacto cercano. Aislarse inmediatamente y [employer] prueba si tiene sntomas. Si pasaron ms de 90 das desde que se recuper de la infeccin, siga las recomendaciones de los CDC para Pharmacologist. Estas recomendaciones variarn dependiendo de su estado de vacunacin. 12/31/2020 Derl Barrow del contenido: [employer] for Immunization and Respiratory Diseases (NCIRD), Division of Viral Diseases ([place] de [place] y [place]) Esta informacin no tiene Marine scientist el consejo del mdico. Asegrese de hacerle al mdico cualquier pregunta que tenga. Document Revised: 01/13/2021 Document Reviewed: 02/05/2021 Elsevier Patient Education  2022 [employer].      I discussed the assessment and treatment plan with the patient. The patient was provided an opportunity to ask questions and all were answered. The patient agreed with the plan and demonstrated an understanding of the instructions.   The patient was advised to call back or seek an in-person evaluation if the symptoms worsen or if the condition fails to improve as anticipated.  I provided 23 minutes of non-face-to-face time during this encounter.   Fenton Foy, NP

## 2021-06-22 NOTE — Patient Instructions (Signed)
Covid 19:   Stay well hydrated  Stay active  Deep breathing exercises   May take tylenol for fever or pain  You are being prescribed MOLNUPIRAVIR for COVID-19 infection.     [facility] and [place] ([address], [place], [place] [phone])  Monday through Friday 8a-5:30p   Please call the pharmacy or go through the drive through vs going inside if you are picking up the mediation yourself to prevent further spread. If prescribed to a [facility] affiliated pharmacy, a pharmacist will bring the medication out to your car.   ADMINISTRATION INSTRUCTIONS: Take with or without food. Swallow the tablets whole. Don't chew, crush, or break the medications because it might not work as well  For each dose of the medication, you should be taking FOUR tablets at one time, TWICE a day   Finish your full five-day course of Molnupiravir even if you feel better before you're done. Stopping this medication too early can make it less effective to prevent severe illness related to [place].    Molnupiravir is prescribed for YOU ONLY. Don't share it with others, even if they have similar symptoms as you. This medication might not be right for everyone.   Make sure to take steps to protect yourself and others while you're taking this medication in order to get well soon and to prevent others from getting sick with COVID-19.   **If you are of childbearing potential (any gender) - it is advised to not get pregnant while taking this medication and recommended that condoms are used for female partners the next 3 months after taking the medication out of extreme caution    COMMON SIDE EFFECTS: Diarrhea Nausea  Dizziness    If your COVID-19 symptoms get worse, get medical help right away. Call 911 if you experience symptoms such as worsening cough, trouble breathing, chest pain that doesn't go away, confusion, a hard time staying awake, and pale or blue-colored  skin. This medication won't prevent all COVID-19 cases from getting worse.      Follow up:  Follow up in if needed  COVID-19: Cuarentena y aislamiento COVID-19: Quarantine and Art therapist cuarentena Si estuvo expuesto [place] cuarentena y [place] de otras personas cuando haya estado en contacto estrecho con alguien que tiene COVID-19. Aslese Si est enfermo o el resultado de la prueba es positivo Aslese cuando est enfermo o cuando tenga COVID-19, aunque no tenga sntomas. Cundo quedarse en casa Cmo calcular la cuarentena La fecha de la exposicin se considera el da 0. El da 1 es Engineer, manufacturing systems completo despus de su ltimo contacto con una persona que tuvo COVID-19. Foy [place] en casa y [place] de otras personas durante al menos 5 das. Descubra por [employer] for Disease Control and Prevention Librarian, academic) (Centros para Building surveyor y la Prevencin de Arboriculturist) han actualizado las pautas para el pblico general. SI USTED se expuso al COVID-19 y NO est  al daSI USTED se expuso al COVID-19 y NO est con las vacunas contra el COVID-19 Haga cuarentena durante al menos 5 [place] en su [place] en su casa y haga cuarentena durante al menos 5 das completos. Use una mascarilla bien ajustada si debe estar cerca de otras personas en su casa. No viaje. Somtase a pruebas de Chief Operating Officer no presente sntomas, hgase la prueba al menos 5 das despus de haber tenido el ltimo contacto estrecho con una persona con COVID-19. Despus de la cuarentena Est atento a los  sntomas Est atento a los sntomas hasta 10 das despus de la ltima vez que haya tenido contacto estrecho con una persona con COVID-19. Evite viajar Es Media planner 10 das despus de la ltima vez que tuvo contacto estrecho con una persona con COVID-19. Si presenta sntomas Aslese de inmediato y hgase la prueba. Contine quedndose en su casa hasta que [employer]. Use una  mascarilla bien ajustada cuando est con [employer] Pacific. Tome precauciones Goldman Sachs da 10 Use una mascarilla bien ajustada Use una mascarilla bien ajustada durante 10 das completos en todo momento en que est cerca de otras personas dentro de su casa o en pblico. No vaya a lugares donde no pueda usar L[employer]ell [employer]. Si debe [place] 6 y 59, tome precauciones. Evite estar cerca de personas que tengan ms probabilidades de enfermarse de COVID-19. SI USTED se expuso al COVID-19 y est  al daSI USTED se expuso al COVID-19 y est con las vacunas contra el COVID-19 No haga cuarentena No es necesario que se quede en su casa, a menos que presente sntomas. Somtase a pruebas de Chief Operating Officer no presente sntomas, hgase la prueba al menos 5 das despus de haber tenido el ltimo contacto estrecho con una persona con COVID-19. Est atento a los sntomas Est atento a los sntomas hasta 10 das despus de la ltima vez que haya tenido contacto estrecho con una persona con COVID-19. Si presenta sntomas Aslese de inmediato y hgase la prueba. Contine quedndose en su casa hasta que [employer]. Use una mascarilla bien ajustada cuando est con [employer]. Tome precauciones Goldman Sachs da 10 Use una mascarilla bien ajustada Use una mascarilla bien ajustada durante 10 das completos en todo momento en que est cerca de otras personas dentro de su casa o en pblico. No vaya a lugares donde no pueda usar [employer]. Tome precauciones si viaja Evite estar cerca de personas que tengan ms probabilidades de enfermarse de COVID-19. SI USTED se expuso al COVID-19 y tuvo COVID-19 confirmado en los ltimos [address] (tuvo un resultado positivo en la prueba viral) No haga cuarentena No es necesario que se quede en su casa, a menos que presente sntomas. Est atento a los sntomas Est atento a los sntomas hasta 10 das despus de la ltima vez que  haya tenido contacto estrecho con una persona con COVID-19. Si presenta sntomas Aslese de inmediato y hgase la prueba. Contine quedndose en su casa hasta que [employer]. Use una mascarilla bien ajustada cuando est con [employer]. Tome precauciones Goldman Sachs da 10 Use una mascarilla bien ajustada Use una mascarilla bien ajustada durante 10 das completos en todo momento en que est cerca de otras personas dentro de su casa o en pblico. No vaya a lugares donde no pueda usar [employer]. Tome precauciones si viaja Evite estar cerca de personas que tengan ms probabilidades de enfermarse de COVID-19. Cmo calcular el [place] 0 es Engineer, manufacturing systems en que tiene sntomas u obtiene un resultado positivo en la prueba viral. El da 1 es Engineer, manufacturing systems completo despus de que aparecen los sntomas o de que le toman la muestra para la prueba. Si tiene COVID-19 o tiene sntomas, aslese durante al menos 5 das. SI USTED tuvo resultado positivo de COVID-19 o tiene sntomas, independientemente de su estado respecto de la vacunacin [place] en su [place] 5 das [place] en su  casa durante 5 das y aslese de las dems personas que vivan en su casa. Use una mascarilla bien ajustada si debe estar cerca de otras personas en su casa. No viaje. Finalizacin del aislamiento si tuvo sntomas Finalice el aislamiento despus de 5 das completos si no tiene fiebre durante 24 horas (sin usar medicamentos para Primary school teacher) y sus sntomas estn mejorando. Finalizacin del aislamiento si NO tuvo sntomas Finalice el aislamiento cuando hayan pasado al menos 5 das completos despus del resultado positivo en la prueba. Si se enferma de gravedad de COVID-19 o tiene un sistema inmunitario debilitado Debe aislarse durante al [employer] 10 das. Consulte a su mdico antes de Barista. Tome precauciones Goldman Sachs da 10 Use una mascarilla bien ajustada Use  una mascarilla bien ajustada durante 10 das completos en todo momento en que est cerca de otras personas dentro de su casa o en pblico. No vaya a lugares donde no pueda usar [employer]. No viaje No viaje hasta 10 das despus del inicio de sus sntomas o hasta la fecha en que se le realiz la prueba que dio resultado positivo si no tuvo sntomas. Evite estar cerca de personas que tengan ms probabilidades de enfermarse de COVID-19. Definiciones Exposicin Contacto con una persona infectada por el SARS-CoV-2, el virus que causa el COVID-19, de manera tal que aumenta la probabilidad de infectarse por el virus. Contacto estrecho Se considera contacto estrecho a la persona que se haya encontrado a menos de 6 pies de distancia de una persona infectada (confirmada por laboratorio o con diagnstico clnico) durante un total acumulado de 15 minutos o ms durante un perodo de 24 horas. Por ejemplo, tres exposiciones individuales de 5 minutos conforman un total de 15 minutos. Las personas que estn expuestas a alguien con COVID-19 despus de haber completado al menos 5 das de aislamiento no se consideran contactos estrechos. Haga cuarentena La cuarentena es una estrategia que se utiliza para prevenir la transmisin del COVID-19 al Cisco separadas de los dems a las personas que han estado en contacto estrecho con alguien con [id number]. Quin no necesita hacer cuarentena? Si tuvo contacto estrecho con una persona con COVID-19 y est en uno de los siguientes grupos, no necesita Patent attorney. Est al da con las vacunas contra el COVID-19. Tuvo COVID-19 confirmado en los ltimos [address] (lo que significa que obtuvo un resultado positivo en una prueba viral). Si est al da con las vacunas contra el COVID-19, debe usar una mascarilla bien ajustada cuando est con otras personas durante 10 das a partir de la fecha de ltimo contacto estrecho con una persona con COVID-19 (la fecha del  ltimo contacto estrecho se considera el da 0). Hgase la prueba al menos 5 das despus de haber tenido el ltimo contacto estrecho con una persona con COVID-19. Si da positivo o presenta sntomas de COVID-19, aslese de las dems personas y siga las recomendaciones de la seccin Aislamiento a continuacin. Si obtuvo un resultado positivo de COVID-19 en una prueba viral en los ltimos 4 das y posteriormente se recuper y Scientist, research (medical) sin sntomas de COVID-19, no es necesario que haga cuarentena ni que se haga la prueba despus de un contacto estrecho. Debe usar Geographical information systems officer bien ajustada cuando est con otras personas durante 10 das a partir de la fecha de ltimo contacto estrecho con una persona con COVID-19 (la fecha del ltimo contacto estrecho se considera el da 0). Si tiene sntomas de COVID-19, hgase la prueba, aslese  [place] y [place] recomendaciones de la seccin Aislamiento a continuacin. [place] deben hacer cuarentena? Si est en contacto estrecho con una persona con COVID-19, debe hacer cuarentena si no est al da con las vacunas contra el COVID-19. Esto incluye a las personas que no estn vacunadas. Qu hacer durante la cuarentena [place] en su casa y mantngase alejado [place] al menos 5 das (del da 0 al da 5) despus del ltimo contacto con Ardelia Mems persona con COVID-19. La fecha de la exposicin se considera el da 0. Use una mascarilla bien ajustada cuando est cerca de otras personas en su casa, si es posible. [place] 10 das despus de su ltimo contacto estrecho con una persona con COVID-19, est atento a si presenta fiebre (100.4 F o ms), tos, dificultad para respirar u otros sntomas de COVID-19. Si presenta sntomas, hgase la prueba inmediatamente y aslese hasta que reciba el resultado. Si el resultado es positivo, siga las recomendaciones de [place]. Si no presenta sntomas, hgase la prueba al menos 5 das despus de haber tenido el  ltimo contacto estrecho con una persona con COVID-19. Si el resultado de la prueba es negativo, puede salir de Medical illustrator, [place] siga usando una mascarilla bien ajustada cuando est cerca de otras personas en su casa y en pblico hasta 10 das despus de su ltimo contacto estrecho con una persona con COVID-19. Si el resultado es positivo, debe aislarse durante al menos 5 das a partir de la fecha de la prueba positiva (si no tiene sntomas). Si presenta sntomas de COVID-19, aslese durante al menos 5 das desde la fecha en que comenzaron los sntomas (la fecha en que comenzaron los sntomas es el da 0). Siga las recomendaciones de la seccin Aislamiento a continuacin. Si no puede hacerse una prueba 5 das despus del ltimo contacto estrecho con una persona con COVID-19, puede salir de su casa despus del da 5 si no ha tenido sntomas de COVID-19 en todo el perodo de 5 das. Use una mascarilla bien ajustada durante 10 das despus de la fecha del ltimo contacto estrecho cuando est con otras personas en su casa y en pblico. Evite a las personas con un sistema inmunitario debilitado o con ms probabilidades de enfermarse gravemente de COVID-19, y Art therapist las residencias de Mining engineer y otros entornos de alto riesgo, Nurse, children's despus de al menos 10 das. Si es posible, aljese de las personas con las que vive, Financial risk analyst de las personas que tienen un mayor riesgo de [place]er gravemente por COVID-19, as como de otras personas fuera de su casa, durante los 10 das completos posteriores a su ltimo contacto estrecho con una persona con COVID-19. Si no puede Patent attorney, debe usar [employer] bien ajustada durante 10 das cuando est cerca de otras personas en su casa y en pblico. Si no puede usar Geographical information systems officer cuando est cerca de otras personas, debe Clinical biochemist en cuarentena durante 10 das. Evite a las personas con un sistema inmunitario debilitado o con ms probabilidades de enfermarse gravemente de  COVID-19, y evite las residencias de ancianos y otros entornos de alto riesgo, Nurse, children's despus de al menos 10 das. Consulte informacin adicional sobre viajes. No vaya a lugares donde no pueda usar mascarilla, como restaurantes y algunos gimnasios, y evite comer cerca de Producer, television/film/video en su casa y en el trabajo hasta 10 das despus de su ltimo contacto estrecho con una persona con COVID-19. Despus de la cuarentena Est atento a la aparicin  de sntomas hasta 10 das despus de su ltimo contacto estrecho con una persona con COVID-19. Si tiene sntomas, aslese inmediatamente y hgase la prueba. Cuarentena en entornos con congregacin de alto riesgo En ciertos entornos con congregacin en los que existe un alto riesgo de transmisin secundaria (como correccionales y centros penitenciarios, refugios para personas sin hogar o cruceros), los CDC recomiendan una cuarentena de 10 das para los residentes, independientemente de que hayan recibido o no la vacunacin y Water engineer. Durante los perodos de Administrator crtica de personal, los establecimientos pueden optar por acortar el perodo de cuarentena del personal con el fin de garantizar la continuidad de las operaciones. La decisin de acortar la cuarentena en estos entornos debe tomarse en consulta con los departamentos de salud estatales, locales, tribales o territoriales y Counselling psychologist en cuenta el contexto y las caractersticas del establecimiento. La gua especfica para cada entorno de los CDC proporciona recomendaciones adicionales para estos entornos. Aislamiento El aislamiento se [place] para separar a las personas con sospecha o confirmacin de COVID-19 de las personas sin COVID-19. Las personas que estn aisladas deben quedarse en su casa hasta que sea seguro que estn cerca de los dems. En casa, cualquier persona enferma o infectada debe separarse de los dems, o usar una mascarilla bien ajustada cuando necesite estar cerca de los dems. Las  personas aisladas deben quedarse en una "habitacin de enfermos" o zona especfica y usar un bao separado si est disponible. Todas las personas que tengan COVID-19 presunto o confirmado deben quedarse en casa y Therapist, nutritional de las dems personas durante al menos 5 das completos (el da 0 es Engineer, manufacturing systems de sntomas o la fecha del da de la prueba viral positiva para las personas asintomticas). Deben usar mascarilla cuando estn cerca de otras personas en su casa y en pblico durante 5 das ms. Las personas con COVID-19 confirmado o que presentan sntomas de COVID-19 deben aislarse independientemente de su estado respecto de la vacunacin. Esto puede comprender lo siguiente: Personas con prueba viral positiva de COVID-19, independientemente de que tengan sntomas o no. Personas con sntomas de COVID-19, incluidas las personas que estn [employer] de la prueba o que no se han hecho la prueba. Las personas con sntomas deben aislarse aunque no sepan si han estado en contacto estrecho con alguien con COVID-19. Qu hacer durante el aislamiento Est atento a sus sntomas. Si tiene un signo de advertencia de emergencia (lo que incluye dificultad para respirar), busque atencin mdica de emergencia de manera inmediata. Si es posible, qudese en una habitacin separada de otros miembros del hogar. Use un bao aparte, si es posible. Si es posible, tome medidas para mejorar la ventilacin en su casa. Evite el contacto con los dems miembros del hogar y con las [place]. No comparta artculos de uso personal del hogar, como tazas, toallas y utensilios. Use una mascarilla bien ajustada cuando necesite estar cerca de otras personas. Obtenga ms informacin sobre qu hacer si est enfermo y cmo notificar a sus contactos. Finalizacin del aislamiento para personas que tuvieron COVID-19 con sntomas Si tuvo COVID-19 con sntomas, aslese durante al menos 5 das. Para calcular su perodo de aislamiento  de 5 das, el da 0 es Engineer, manufacturing systems en que tuvo sntomas. El da 1 es Engineer, manufacturing systems completo despus de que aparecieron los sntomas. Puede salir del aislamiento despus de 5 das completos. Puede finalizar el aislamiento despus de 5 das completos si no tiene fiebre durante 24 horas sin  el uso de medicamentos para bajar la fiebre y si sus otros sntomas han mejorado (la prdida del gusto y Armed forces logistics/support/administrative officer puede persistir durante semanas o meses despus de la recuperacin y no es Leisure centre manager el final del aislamiento). Debe seguir usando una mascarilla bien ajustada cuando est cerca de otras personas en su casa y en pblico durante 5 das ms (del da 6 al da 10) despus del final de su perodo de aislamiento de 5 das. Si no puede usar Geographical information systems officer cuando est cerca de otras personas, Theatre manager en aislamiento durante 10 das completos. Evite a las personas con un sistema inmunitario debilitado o con ms probabilidades de enfermarse gravemente de COVID-19, y evite las residencias de ancianos y otros entornos de alto riesgo, Nurse, children's despus de al menos 10 das. Si sigue teniendo fiebre o sus otros sntomas no han mejorado despus de 5 das de [place], debe esperar para Financial risk analyst su aislamiento hasta que no tenga fiebre durante 24 horas sin el uso de medicamentos para Sports coach la fiebre y [place] que sus otros sntomas hayan mejorado. Contine usando una mascarilla bien ajustada hasta el da 10. Comunquese con su mdico si tiene alguna pregunta. Consulte informacin adicional sobre viajes. No vaya a lugares donde no pueda usar mascarilla, como restaurantes y algunos gimnasios, y evite comer cerca de Producer, television/film/video en su casa y en el trabajo hasta que hayan pasado 10 das completos despus del Passenger transport manager en que tuvo sntomas. Si la persona tiene Paramedic a una prueba y desea [place], el mejor enfoque es utilizar una prueba de antgenos1 [place] final del perodo de aislamiento de 5 das. Tome la [place]  para la prueba nicamente si no tiene fiebre durante 24 horas sin el uso de medicamentos para Engineer, materials fiebre y si sus otros sntomas han mejorado (la prdida del gusto y Armed forces logistics/support/administrative officer puede persistir durante semanas o meses despus de la recuperacin y no es Leisure centre manager el final del aislamiento por esto). Si el resultado de la prueba es positivo, debe continuar con el aislamiento Doctor, general practice 10. Si el resultado de la prueba es negativo, puede Barista, Armed forces training and education officer debe seguir usando una mascarilla bien ajustada cuando est cerca de otras personas en su casa y en pblico hasta el da 10. Siga las recomendaciones adicionales sobre el uso de [place] y sobre evitar viajar que se describen anteriormente. 1Como se indica en el etiquetado para las pruebas de antgenos sin receta autorizadas: [employer] deben tratarse como presuntos. Los Microsoft no descartan la infeccin por SARS-CoV-2 y no deben [employer] nica base para tomar decisiones de tratamiento o de manejo del paciente, incluidas las decisiones de control de la infeccin. Para mejorar los [place], las pruebas de antgenos deben [employer] veces en un perodo de tres das con un intervalo de al menos 24 horas y no ms de 48 horas entre pruebas. Tenga en cuenta que estas recomendaciones sobre la finalizacin del aislamiento no se aplican a Engineer, manufacturing con COVID-19 moderado o muy grave ni a personas con el sistema inmunitario debilitado. Consulte la seccin a continuacin para Therapist, music las recomendaciones sobre cundo finalizar el aislamiento para [place]. Finalizacin del aislamiento de personas que tuvieron resultado positivo en la prueba de COVID-19 pero no tuvieron sntomas Si tiene resultado positivo de COVID-19 y nunca presenta sntomas, aslese durante al menos 5 [place]. El da 0 es el da de la prueba viral positiva (segn la fecha en que se la  hizo) y Games developer 1 es Engineer, manufacturing systems completo despus de  la toma de la [place] para la prueba que dio resultado positivo. Puede salir del aislamiento despus de 5 das completos. Si sigue sin tener sntomas, puede finalizar el aislamiento despus de al menos 5 das. Debe seguir usando una mascarilla bien ajustada cuando est cerca de otras personas en su casa y en [place] 10 (del da 6 al da 10). Si no puede usar Geographical information systems officer cuando est cerca de otras personas, Theatre manager en aislamiento durante 10 das. Evite a las personas con un sistema inmunitario debilitado o con ms probabilidades de enfermarse gravemente de COVID-19, y evite las residencias de ancianos y otros entornos de alto riesgo, Nurse, children's despus de al menos 10 das. Si presenta sntomas despus de recibir un resultado positivo, su perodo de aislamiento de 5 das debe comenzar de [place]. El da 0 es Engineer, manufacturing systems en que tiene sntomas. Siga las recomendaciones anteriores sobre la finalizacin del aislamiento de las personas que tuvieron COVID-19 con sntomas. Consulte informacin adicional sobre viajes. No vaya a lugares donde no pueda usar mascarilla, como restaurantes y algunos gimnasios, y Futures trader cerca de Producer, television/film/video en su casa y en el trabajo hasta que hayan pasado 10 das desde el da en que recibi el resultado positivo en la prueba. Si la persona tiene Paramedic a una prueba y desea [place], el mejor enfoque es utilizar una prueba de antgenos1 [place] final del perodo de aislamiento de 5 das. Si el resultado de la prueba es positivo, debe continuar con el aislamiento Doctor, general practice 10. Si el resultado de la prueba es positivo, tambin puede elegir realizarse la prueba diariamente y si el resultado es negativo, puede finalizar el aislamiento, Armed forces training and education officer continuar usando una mascarilla bien ajustada cuando est cerca de otras personas en su casa y en pblico hasta el da 10. Siga las recomendaciones adicionales sobre el uso de [place] y sobre evitar viajar que se describen  anteriormente. 1Como se indica en el etiquetado para las pruebas de antgenos sin receta autorizadas: [employer] deben tratarse como presuntos. Los Microsoft no descartan la infeccin por SARS-CoV-2 y no deben [employer] nica base para tomar decisiones de tratamiento o de manejo del paciente, incluidas las decisiones de control de la infeccin. Para mejorar los [place], las pruebas de antgenos deben [employer] veces en un perodo de tres das con un intervalo de al menos 24 horas y no ms de 48 horas entre pruebas. Finalizacin del aislamiento para personas con COVID-19 moderado o muy grave, o con un sistema inmunitario debilitado Las personas moderadamente enfermas de COVID-19 (con sntomas que afecten a los pulmones, como falta de aire o dificultad para Ambulance person) deben aislarse durante 10 das y seguir todas las dems precauciones de aislamiento. Para calcular su perodo de aislamiento de [address] el da 0 es Engineer, manufacturing systems con sntomas. El da 1 es Engineer, manufacturing systems completo despus de que aparecieron los sntomas. Si no est seguro de si sus sntomas son moderados, hable con un mdico para obtener ms orientacin. Las personas que estn muy enfermas a causa del COVID-19 (esto significa personas que fueron hospitalizadas o necesitaron cuidados intensivos o asistencia de Animal nutritionist) y Engineer, manufacturing que tienen sistemas inmunitarios debilitados podran Marine scientist en casa durante ms tiempo. Es posible que tambin deban hacerse una prueba viral para determinar cundo podrn estar cerca de Producer, television/film/video. Los CDC recomiendan un perodo  de aislamiento de un mnimo de 10 das y un mximo de [place] que estuvieron gravemente enfermas de COVID-19 y para las personas con el sistema inmunitario debilitado. Consulte a su mdico cundo Energy manager a Agricultural consultant de Producer, television/film/video. Si no est seguro de si sus sntomas son graves o si tiene el sistema inmunitario  debilitado, hable con un mdico para obtener ms orientacin. Las Engineer, manufacturing con un sistema inmunitario debilitado deben hablar con el mdico sobre la posibilidad de respuestas inmunitarias reducidas a las vacunas contra el COVID-19 y la necesidad de Clinical biochemist con las medidas de Engineer, building services (lo que incluye usar una mascarilla bien ajustada y Aeronautical engineer y espacios interiores mal ventilados) para protegerse contra el COVID-19 hasta que el mdico les indique lo contrario. Tambin se debe animar a los contactos estrechos de las personas inmunodeprimidas, incluidos los miembros del Museum/gallery curator, a recibir todas las dosis recomendadas de la vacuna contra el COVID-19 para contribuir a la proteccin de Social worker. Aislamiento en entornos con congregacin de alto riesgo En ciertos entornos con congregacin de alto riesgo que tienen un alto riesgo de transmisin secundaria y donde no es factible dividir a las Financial planner cohortes (como correccionales y Production assistant, radio, refugios para personas sin hogar y cruceros), los CDC recomiendan un perodo de aislamiento de 10 das para los residentes. Durante los perodos de Administrator crtica de personal, los establecimientos pueden optar por acortar el perodo de aislamiento del personal con el fin de Engineer, materials la continuidad de las operaciones. La decisin de Education administrator en estos entornos debe tomarse en consulta con los departamentos de salud estatales, locales, tribales o territoriales y Counselling psychologist en cuenta el contexto y las caractersticas del establecimiento. La gua especfica para cada entorno de los CDC proporciona recomendaciones adicionales para estos entornos. Esta gua de los CDC est destinada a complementar (no a Training and development officer) cualquier ley, norma y reglamento federal, estatal, local, territorial o tribal sobre salud y [place]. Recomendaciones para entornos especficos Estas recomendaciones no se aplican a los profesionales de la  atencin mdica. Para obtener instrucciones especficas para estos entornos, consulte Profesionales de la atencin mdica: Gua provisional para la gestin del personal de atencin mdica con infeccin por SARS-CoV-2 o exposicin al [employer], residentes y visitantes de entornos de atencin mdica: Recomendaciones provisionales para la prevencin y el control de infecciones para el personal de atencin mdica durante la pandemia de la enfermedad por coronavirus de 2019 (COVID-19) Se encuentran disponibles recomendaciones y guas adicionales especficas de otros entornos. Estas recomendaciones sobre cuarentena y aislamiento se aplican a los entornos escolares desde el jardn de infantes hasta el 33. grado. Puede encontrar la gua adicional aqu: Descripcin general de la cuarentena por COVID-19 para escuelas de jardn de infantes hasta el 12. grado Viajeros: Informacin y recomendaciones de viaje Establecimientos y otros entornos con Database administrator de personas: pginas de gua para entornos comunitarios, laborales y escolares Preguntas frecuentes sobre la exposicin continua al COVID-19 Vivo con alguien con COVID-19, pero no puedo separarme de esa persona. Cmo manejamos la cuarentena en esta situacin? Es [employer] personas con COVID-19 permanezcan separadas de otras personas, si es posible, incluso si viven juntas. Si no es posible que la persona con COVID-19 se separe de las otras personas con las que vive, esas personas con las que convive tendrn exposicin continua, es decir que estarn expuestas repetidamente hasta que esa persona ya no pueda propagar el virus a Producer, television/film/video. En esta situacin, hay  precauciones que puede tomar para Immunologist del COVID-19: La persona con COVID-19 y todas las personas con las que viven deben usar una mascarilla bien ajustada dentro del Museum/gallery curator. Si es posible, una sola persona debe cuidar de la persona con COVID-19 para limitar la  cantidad de personas que estn en contacto estrecho con la persona infectada. Tome medidas para protegerse y Museum/gallery curator a los dems para reducir la transmisin en el hogar: Pngase en cuarentena si no est al da con las vacunas contra el COVID-19. Aslese si est enfermo o tuvo un resultado positivo de COVID-19, aunque no tenga sntomas. Obtenga ms informacin [employer] de salud pblica con respecto a las Hauser, el uso de Administrator y la cuarentena de los contactos estrechos, como usted, que tienen exposicin continua. Estas recomendaciones difieren dependiendo de su estado de vacunacin. Qu debo hacer si tengo una exposicin continua al COVID-19 por alguien con quien vivo? Las recomendaciones para esta situacin dependen de su estado de vacunacin: Si no est al da con las vacunas contra el COVID-19 y tiene exposicin continua al COVID-19, debe hacer lo siguiente: Iniciar la cuarentena inmediatamente y Corporate treasurer durante todo el perodo de aislamiento de la persona con COVID-19. Continuar la cuarentena durante 5 das ms a partir del da despus del final del aislamiento de la persona con COVID-19. Hacerse una prueba al menos 5 das despus del final del aislamiento de la persona infectada con Anne Ng. Si el resultado de la prueba es negativo, puede salir de su casa, Armed forces training and education officer debe seguir usando una mascarilla bien ajustada cuando est cerca de otras personas en su hogar y en pblico hasta 10 das despus del final del aislamiento de la persona con COVID-19. Aislarse inmediatamente si presenta sntomas de COVID-19 o si da positivo en la prueba. Si est al da con las vacunas contra el COVID-19 y tiene exposicin continua al COVID-19, debe hacer lo siguiente: Hacerse una prueba al menos 5 das despus de su primera exposicin. Una persona con COVID-19 se considera infecciosa desde los 2 das anteriores a presentar sntomas, o 2 das antes de la fecha de la prueba positiva si no  tiene sntomas. Hacerse una prueba de nuevo al menos 5 das despus del final del aislamiento de la persona con COVID-19. Usar una mascarilla bien ajustada cuando est con la persona con [id number], y hacerlo durante todo su perodo de aislamiento. Usar una mascarilla bien ajustada cuando est con otras personas durante 10 das despus de que finalice el perodo de aislamiento de la persona infectada. Aislarse inmediatamente si presenta sntomas de COVID-19 o si da positivo en la prueba. Qu debo hacer si varias personas con las que vivo tienen un resultado positivo de COVID-19 en diferentes momentos? Las recomendaciones para esta situacin dependen de su estado de vacunacin: Si no est al da con las vacunas contra el COVID-19, debe hacer lo siguiente: Ponerse en cuarentena durante todo el perodo de aislamiento de cualquier persona infectada con IT trainer. Continuar la cuarentena hasta 5 das despus de la fecha de finalizacin del aislamiento de la ltima persona infectada que viva con usted. Por ejemplo, si el ltimo da de aislamiento de la ltima persona infectada con COVID-19 fue el 84 de junio, el nuevo perodo de [place] de [place] de [place]. Hacerse una prueba al menos 5 das despus del final del aislamiento de la ltima persona infectada con Anne Ng. Usar una mascarilla bien ajustada cuando est cerca de cualquier persona con  COVID-19 mientras esa persona est aislada. Usar una mascarilla bien ajustada cuando est con otras personas hasta 10 das despus de su ltimo contacto estrecho. Aislarse inmediatamente si presenta sntomas de COVID-19 o si da positivo en la prueba. Si est al da con las vacunas contra el COVID-19, debe hacer lo siguiente: Filomena Jungling prueba al menos 5 das despus de su primera exposicin. Una persona con COVID-19 se considera infecciosa desde los 2 das anteriores a presentar sntomas, o 2 das antes de la fecha de la prueba positiva si no  tiene sntomas. Hacerse una prueba de nuevo al menos 5 das despus del final del aislamiento de la ltima persona infectada con Anne Ng. Usar una mascarilla bien ajustada cuando est cerca de cualquier persona con COVID-19 mientras esa persona est aislada. Usar una mascarilla bien ajustada cuando est con otras personas durante 10 das despus del final del aislamiento de la ltima persona infectada que viva con usted. Por ejemplo, si el ltimo da de aislamiento de la ltima persona infectada con COVID-19 fue el 55 de junio, el nuevo perodo de 10 das para usar una mascarilla bien ajustada en lugares pblicos cerrados comienza el 1 de julio. Aislarse inmediatamente si presenta sntomas de COVID-19 o si da positivo en la prueba. Tuve COVID-19 y termin el aislamiento. Tengo que ponerme en cuarentena o hacerme una prueba si alguien con quien vivo contrae COVID-19 poco despus de que termin el aislamiento? No. Si termin el aislamiento hace poco y alguien que vive con usted da positivo en la prueba del virus que causa COVID-19 poco despus del final de su perodo de aislamiento, no tiene que ponerse en cuarentena ni hacerse una prueba siempre que no presente nuevos sntomas. Una vez que todas las personas que viven juntas hayan terminado el aislamiento o la cuarentena, consulte las siguientes directrices acerca de nuevas exposiciones al COVID-19. Si tuvo COVID-19 en los [address] anteriores y luego estuvo en contacto estrecho con alguien con COVID-19, no tiene que ponerse en cuarentena ni hacerse una prueba si no tiene sntomas. Sin embargo, Estate manager/land agent lo siguiente: Usar Manufacturing engineer en interiores en pblico durante 10 das despus de la fecha del ltimo Loss adjuster, chartered. Vigilar los sntomas de COVID-19 durante 10 das a partir de la fecha de su ltimo contacto cercano. Aislarse inmediatamente y [employer] prueba si tiene sntomas. Si pasaron ms de 90 das desde que se  recuper de la infeccin, siga las recomendaciones de los CDC para Pharmacologist. Estas recomendaciones variarn dependiendo de su estado de vacunacin. 12/31/2020 Derl Barrow del contenido: [employer] for Immunization and Respiratory Diseases (NCIRD), Division of Viral Diseases ([place]) Esta informacin no tiene Marine scientist el consejo del mdico. Asegrese de hacerle al mdico cualquier pregunta que tenga. Document Revised: 01/13/2021 Document Reviewed: 02/05/2021 Elsevier Patient Education  [place].

## 2021-07-01 ENCOUNTER — Other Ambulatory Visit: Payer: Self-pay

## 2021-07-01 ENCOUNTER — Ambulatory Visit (INDEPENDENT_AMBULATORY_CARE_PROVIDER_SITE_OTHER): Payer: Self-pay | Admitting: Physician Assistant

## 2021-07-01 ENCOUNTER — Encounter: Payer: Self-pay | Admitting: Physician Assistant

## 2021-07-01 DIAGNOSIS — M75111 Incomplete rotator cuff tear or rupture of right shoulder, not specified as traumatic: Secondary | ICD-10-CM

## 2021-07-01 DIAGNOSIS — M7541 Impingement syndrome of right shoulder: Secondary | ICD-10-CM

## 2021-07-01 NOTE — Progress Notes (Signed)
HPI: Patient returns today due to continued right shoulder pain.  She was last seen by Dr. Ninfa Linden on 05/26/2021 and received a subacromial injection.  This only helped slightly.  Still having pain with overhead activity.  She ranks her pain to be 8 out of 10 pain at worst in the right shoulder.  MRI did show partial-thickness bursal surface tearing of the rotator cuff.  Tendinosis of the cuff and biceps tendon.  Also slight tear involving the superior labrum.  Physical exam: Right shoulder: She is 5 and a 5 strength with internal rotation against resistance bilaterally.  4 out of 5 strength on the right with external rotation against resistance.  Empty can test is positive on the right.  Normal strength on the left.  Positive impingement testing on the right negative on the left.  Impression: Partial right rotator cuff tear  Plan discussed with patient through interpreter that due to the fact that she has failed conservative treatment would recommend shoulder arthroscopy for arthroscopic debridement and possible rotator cuff tear repair.  Discussed with her postoperative protocol for shoulder the required debridement versus 1 that required debridement, subacromial decompression and rotator cuff repair.  Questions encouraged and answered at length.  She is given Samella Parr card and will let us know if she would like to proceed with surgery in the future.

## 2021-07-02 ENCOUNTER — Other Ambulatory Visit: Payer: Self-pay

## 2021-08-24 ENCOUNTER — Other Ambulatory Visit: Payer: Self-pay

## 2021-08-24 ENCOUNTER — Ambulatory Visit: Payer: Self-pay | Attending: Family

## 2021-09-03 NOTE — Progress Notes (Signed)
Patient ID: Elaine Burch, female    DOB: Dec 28, 1965  MRN: [id number]  CC: Menopausal Symptoms   Subjective: Elaine Burch is a 54 y.o. female who presents for menopausal symptoms.  Her concerns today include:   MENOPAUSAL SYMPTOMS: Reports as of October 2022 makes 12 months without a period. Denies any intermittent bleeding. Reports 4 years ago while having irregular periods that she was approaching menopause. Also, had an ultrasound at that time related to pelvic pain. Requesting referral to Gynecology.  Hot flashes: yes Night sweats: yes Vaginal dryness: yes Dyspareunia:yes Stress incontinence: no  Hysterectomy: no  2. BACK PAIN: Ongoing for months. Located at bilateral upper back and radiates to the neck. Requesting referral to Orthopedics. Currently seeing Orthopedics for shoulder pain.   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 90 tablet 1   Blood Glucose Monitoring Suppl (TRUE METRIX METER) w/Device KIT Use to check blood sugars 2-3 times per day 1 kit 0   glipiZIDE (GLUCOTROL) 5 MG tablet Take 1 tablet (5 mg total) by mouth daily before breakfast. 90 tablet 1   glucose blood (TRUE METRIX BLOOD GLUCOSE TEST) test strip USE AS INSTRUCTED TO CHECK BLOOD SUGAR 2-3 TIMES 100 strip 9   ibuprofen (ADVIL) 600 MG tablet Take 1 tablet (600 mg total) by mouth every 8 (eight) hours as needed for moderate pain. Take after eating (Patient not taking: No sig reported) 60 tablet 2   meloxicam (MOBIC) 15 MG  tablet Take 0.5-1 tablets (7.5-15 mg total) by mouth daily as needed for pain. (Patient not taking: Reported on 06/10/2021) 30 tablet 6   metFORMIN (GLUCOPHAGE) 500 MG tablet Take 2 tablets (1,000 mg total) by mouth 2 (two) times daily with a meal. 180 tablet 1   TRUEplus Lancets 28G MISC USE TO CHECK BLOOD SUGAR 2-3 TIMES DAILY. 100 each 11   No current facility-administered medications on file prior to visit.    No Known Allergies  Social History   Socioeconomic History   Marital status: Married    Spouse name: Not on file   Number of children: 4   Years of education: Not on file   Highest education level: 6th grade  Occupational History   Not on file  Tobacco Use   Smoking status: Never   Smokeless tobacco: Never  Vaping Use   Vaping Use: Never used  Substance and Sexual Activity   Alcohol use: No   Drug use: No   Sexual activity: Yes    Birth control/protection: None  Other Topics Concern   Not on file  Social History Narrative   Not on file   Social Determinants of Health   Financial Resource Strain: Not on file  Food Insecurity: No Food Insecurity   Worried About Running Out of Food in the Last Year: Never true   Ran Out of Food in the Last Year: Never true  Transportation Needs: No Transportation Needs   Lack of Transportation (Medical): No   Lack of Transportation (Non-Medical): No  Physical Activity: Not on  file  Stress: Not on file  Social Connections: Not on file  Intimate Partner Violence: Not on file    Family History  Problem Relation Age of Onset   Diabetes Mother    Cancer Father    Diabetes Brother    Breast cancer Neg Hx    Colon cancer Neg Hx    Colon polyps Neg Hx    Esophageal cancer Neg Hx    Rectal cancer Neg Hx    Stomach cancer Neg Hx     Past Surgical History:  Procedure Laterality Date   NO PAST SURGERIES      ROS: Review of Systems Negative except as stated above  PHYSICAL EXAM: BP 118/72 (BP Location: Left Arm, Patient  Position: Sitting, Cuff Size: Large)   Pulse 65   Temp 98.3 F (36.8 C)   Resp 18   Ht 5' 2.01" (1.575 m)   Wt 180 lb 6.4 oz (81.8 kg)   SpO2 98%   BMI 32.99 kg/m   Physical Exam HENT:     Head: Normocephalic and atraumatic.  Eyes:     Extraocular Movements: Extraocular movements intact.     Conjunctiva/sclera: Conjunctivae normal.     Pupils: Pupils are equal, round, and reactive to light.  Cardiovascular:     Rate and Rhythm: Normal rate and regular rhythm.     Pulses: Normal pulses.     Heart sounds: Normal heart sounds.  Pulmonary:     Effort: Pulmonary effort is normal.     Breath sounds: Normal breath sounds.  Musculoskeletal:     Cervical back: Normal range of motion and neck supple.  Neurological:     General: No focal deficit present.     Mental Status: She is alert and oriented to person, place, and time.  Psychiatric:        Mood and Affect: Mood normal.        Behavior: Behavior normal.   ASSESSMENT AND PLAN: 1. Menopause: - Per patient request referral to Gynecology for further evaluation and management.  - Ambulatory referral to Gynecology  2. Upper back pain, chronic: - Per patient request referral to Orthopedic Surgery for further evaluation and management. - Ambulatory referral to Orthopedic Surgery  3. Impingement syndrome of right shoulder: 4. 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 Chronic right shoulder pain: - Keep all scheduled appointments at [facility].  6. Language barrier: - [employer] participated during today's appointment. Interpreter Name: Marton Redwood #: [id number].    Patient was given the opportunity to ask questions.  Patient verbalized understanding of the plan and was able to repeat key elements of the plan. Patient was given clear instructions to go to Emergency Department or return to medical center if symptoms don't improve, worsen, or new problems develop.The patient verbalized  understanding.  Orders Placed This Encounter  Procedures   Ambulatory referral to Gynecology    Referral Priority:   Elective    Referral Type:   Consultation    Referral Reason:   Patient Preference    Requested Specialty:   Gynecology    Number of Visits Requested:   1   Ambulatory referral to Orthopedic Surgery    Referral Priority:   Elective    Referral Type:   Surgical    Referral Reason:   Patient Preference    Requested Specialty:   Orthopedic Surgery    Number of Visits Requested:   1   Follow-up with primary provider as scheduled.  Camillia Herter, NP

## 2021-09-07 ENCOUNTER — Ambulatory Visit (INDEPENDENT_AMBULATORY_CARE_PROVIDER_SITE_OTHER): Payer: Self-pay | Admitting: Family

## 2021-09-07 ENCOUNTER — Encounter: Payer: Self-pay | Admitting: Family

## 2021-09-07 ENCOUNTER — Other Ambulatory Visit: Payer: Self-pay

## 2021-09-07 VITALS — BP 118/72 | HR 65 | Temp 98.3°F | Resp 18 | Ht 62.01 in | Wt 180.4 lb

## 2021-09-07 DIAGNOSIS — Z789 Other specified health status: Secondary | ICD-10-CM

## 2021-09-07 DIAGNOSIS — G8929 Other chronic pain: Secondary | ICD-10-CM

## 2021-09-07 DIAGNOSIS — M549 Dorsalgia, unspecified: Secondary | ICD-10-CM

## 2021-09-07 DIAGNOSIS — Z780 Asymptomatic menopausal state: Secondary | ICD-10-CM

## 2021-09-07 DIAGNOSIS — M25511 Pain in right shoulder: Secondary | ICD-10-CM

## 2021-09-07 DIAGNOSIS — M7541 Impingement syndrome of right shoulder: Secondary | ICD-10-CM

## 2021-09-07 DIAGNOSIS — M75111 Incomplete rotator cuff tear or rupture of right shoulder, not specified as traumatic: Secondary | ICD-10-CM

## 2021-09-07 NOTE — Patient Instructions (Signed)
Menopausia Menopause La menopausia es el perodo normal de la vida de una mujer en el que los perodos menstruales cesan por completo. Marca el fin natural de la capacidad de una mujer de quedar embarazada. Se puede definir como la ausencia del perodo menstrual durante 12 meses sin otra causa mdica. La transicin a la menopausia (perimenopausia) con mayor frecuencia ocurre entre los 45 y los 55 aos, y puede durar muchos aos. Durante la perimenopausia, los niveles hormonales cambian en el organismo, lo que puede provocar sntomas y afectar la salud. La menopausia podra aumentar el riesgo de sufrir lo siguiente: Huesos debilitados (osteoporosis), lo cual causa fracturas. Depresin. Endurecimiento y estrechamiento de las arterias (aterosclerosis), lo que puede causar infartos de miocardio y accidentes cerebrovasculares. Cules son las causas? A menudo, la causa de esta afeccin es un cambio natural en los niveles hormonales, que ocurre a medida que envejece. La menopausia tambin puede ser causada por cambios que no son naturales, entre ellos: Ciruga para extirpar ambos ovarios (menopausia quirrgica). Efectos secundarios de algunos medicamentos, como la quimioterapia utilizada para tratar el cncer (menopausia qumica). Qu incrementa el riesgo? Es ms probable que la menopausia comience a una edad ms temprana si tiene ciertas afecciones o se ha realizado ciertos tratamientos, incluidos los siguientes: Un tumor en la hipfisis del cerebro. Una enfermedad que afecte los ovarios y las hormonas. Ciertos tratamientos para el cncer, como quimioterapia o terapia hormonal, o radioterapia en la pelvis. Fumar mucho y consumir alcohol de forma excesiva. Antecedentes familiares de menopausia temprana. Adems, es ms probable que esta afeccin se presente de manera temprana en las mujeres que son muy delgadas. Cules son los signos o sntomas? Los sntomas de esta afeccin  incluyen: Acaloramiento. Perodos menstruales irregulares. Sudoracin nocturna. Cambios en el sentimiento respecto de las relaciones sexuales. Es posible que el deseo sexual disminuya o que se sienta ms incmoda respecto de su sexualidad. Sequedad vaginal y adelgazamiento de las paredes de la vagina. Esto puede causar relaciones sexuales dolorosas. Sequedad de la piel y aparicin de arrugas. Dolores de cabeza. Problemas para dormir (insomnio). Cambios de humor o irritabilidad. Problemas de memoria. Aumento de peso. Crecimiento de vello en la cara y el pecho. Infecciones en la vejiga o problemas para orinar. Cmo se diagnostica? Esta afeccin se diagnostica en funcin de los antecedentes mdicos, un examen fsico, la edad, los antecedentes menstruales y los sntomas. Tambin le podran realizar estudios hormonales. Cmo se trata? En algunos los casos, no se necesita tratamiento. Usted y el mdico deben decidir juntos si se debe realizar un tratamiento. El tratamiento se determinar en funcin de su cuadro clnico y de sus preferencias. El tratamiento de este cuadro clnico se centra en el control de los sntomas. El tratamiento puede incluir: Terapia hormonal para la menopausia. Medicamentos para tratar sntomas o complicaciones especficos. Acupuntura. Vitaminas o suplementos herbales. Antes de comenzar el tratamiento, es importante que le avise al mdico si tiene antecedentes personales o familiares de estas afecciones: Enfermedad cardaca. Cncer de mama. Cogulos de sangre. Diabetes. Osteoporosis. Siga estas instrucciones en su casa: Estilo de vida No consuma ningn producto que contenga nicotina o tabaco, como cigarrillos, cigarrillos electrnicos y tabaco de mascar. Si necesita ayuda para dejar de consumir estos productos, consulte al mdico. Realice, por lo menos, 30 minutos de actividad fsica 5 das por semana o ms. Evite las bebidas con alcohol o cafena, as como las  comidas muy condimentadas. Esto podra ayudar a prevenir los acaloramientos. Intente dormir de 7 a 8 horas   todas las noches. Si tiene acaloramientos: Research scientist (life sciences). Evite las cosas que podran [employer] acaloramientos, como las comidas muy condimentadas, los lugares calientes o el estrs. Respire profundamente y despacio cuando comience a Chief Executive Officer. Tenga un ventilador en su casa y en su oficina. Encuentre modos de [employer], por ejemplo, a travs de la respiracin, meditacin o un diario ntimo. Considere la posibilidad de asistir a terapia grupal con otras mujeres que tengan sntomas de [place]. [place]dale recomendaciones al [employer] reuniones de terapia grupal. Comida y bebida  Siga una dieta saludable y equilibrada que incluya cereales integrales, protenas magras, productos lcteos descremados y [place] frutas y verduras. El mdico podra recomendarle que agregue una mayor cantidad de soja a su dieta. Algunos de los alimentos que contienen soja son el tofu, el tempeh y [place]. Consuma muchos alimentos que contengan calcio y vitamina D para mejorar la salud sea. Algunos productos que contienen mucho calcio son los [employer], el yogur, los frijoles, las [place], las sardinas, el brcoli y la col rizada. Medicamentos Use los medicamentos de venta libre y los recetados solamente como se lo haya indicado el mdico. Hable con el mdico antes de Art gallery manager a Solicitor suplemento herbal. [place] vitaminas y los suplementos recetados como se lo haya indicado el mdico. Indicaciones generales  Lleve un registro de sus perodos menstruales; incluya lo siguiente: El momento en que ocurren. Qu tan abundantes son y cunto duran. Cunto tiempo transcurre entre cada perodo menstrual. Lleve un registro de los sntomas; anote cundo comienzan, con qu frecuencia ocurren y cunto duran. Use lubricantes o humectantes vaginales para aliviar la  sequedad vaginal y Research officer, political party [place]. Cumpla con todas las visitas de seguimiento. Esto es importante. Esto incluye la terapia grupal y la psicoterapia. Comunquese con un mdico si: An tiene perodos menstruales despus de los 73 aos. Siente dolor durante las [employer]. No tuvo perodos menstruales durante los ltimos 12 meses y presenta sangrado vaginal. Solicite ayuda de inmediato si tiene: Depresin grave. Sangrado vaginal excesivo. Dolor al [place]. Latidos cardacos rpidos o irregulares (palpitaciones). Dolor de cabeza intenso. Dolor en el abdomen o indigestin grave. Resumen La menopausia es un perodo normal de la vida de Musician en el que los perodos menstruales cesan por completo. Se define normalmente como la ausencia del periodo menstrual durante 12 meses sin otra causa mdica. La transicin a Mudlogger (perimenopausia) con mayor frecuencia ocurre entre los 34 y los 56 aos, y puede durar varios aos. Los sntomas pueden controlarse mediante medicamentos, cambios en el estilo de vida y terapias complementarias, como acupuntura. Siga una dieta equilibrada que incluya muchos nutrientes para Psychologist, counselling salud sea y la salud cardaca, y para Chief Technology Officer los sntomas durante la [place]. Esta informacin no tiene Marine scientist el consejo del mdico. Asegrese de hacerle al mdico cualquier pregunta que tenga. Document Revised: 04/22/2020 Document Reviewed: 04/22/2020 Elsevier Patient Education  [place].

## 2021-09-07 NOTE — Progress Notes (Signed)
Pt presents for referral to gyn, states has not had menstrual cycle in about 6 months or more, pt also needs referral to Ortho in regards to MR from 7/7

## 2021-09-16 ENCOUNTER — Ambulatory Visit (INDEPENDENT_AMBULATORY_CARE_PROVIDER_SITE_OTHER): Payer: Self-pay | Admitting: Physician Assistant

## 2021-09-16 ENCOUNTER — Other Ambulatory Visit: Payer: Self-pay

## 2021-09-16 ENCOUNTER — Encounter: Payer: Self-pay | Admitting: Physician Assistant

## 2021-09-16 ENCOUNTER — Telehealth: Payer: Self-pay | Admitting: Family

## 2021-09-16 DIAGNOSIS — M5441 Lumbago with sciatica, right side: Secondary | ICD-10-CM

## 2021-09-16 DIAGNOSIS — M5442 Lumbago with sciatica, left side: Secondary | ICD-10-CM

## 2021-09-16 DIAGNOSIS — G8929 Other chronic pain: Secondary | ICD-10-CM

## 2021-09-16 DIAGNOSIS — M7062 Trochanteric bursitis, left hip: Secondary | ICD-10-CM

## 2021-09-16 DIAGNOSIS — M7061 Trochanteric bursitis, right hip: Secondary | ICD-10-CM

## 2021-09-16 IMAGING — MR MR SHOULDER*L* W/O CM
4 of 5 series · 25 of 40 positions shown · non-contrast
Comparison: X-ray 07/02/2019

CLINICAL DATA: Left shoulder pain

EXAM:
MRI OF THE LEFT SHOULDER WITHOUT CONTRAST
TECHNIQUE: Multiplanar, multisequence MR imaging of the shoulder was performed.
No intravenous contrast was administered.

[Series 10: T2 fat-sat · oblique · 4.0mm · 0.59mm/px · 5 of 16 slices shown (1 of 2)]
[im 1/16]
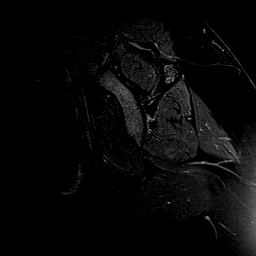
[im 3/16]
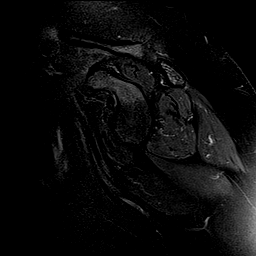
[im 6/16]
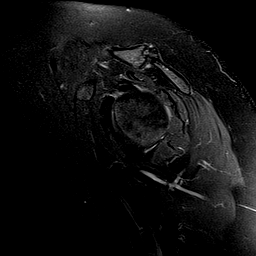
[im 8/16]
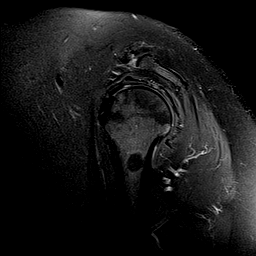
[im 13/16]
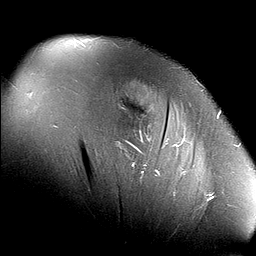

[Series 12: PD fat-sat · axial · 4.0mm · 0.28mm/px · z∈[-70,-1]mm · 9 of 17 slices shown (1 of 2)]
[im 1/17]
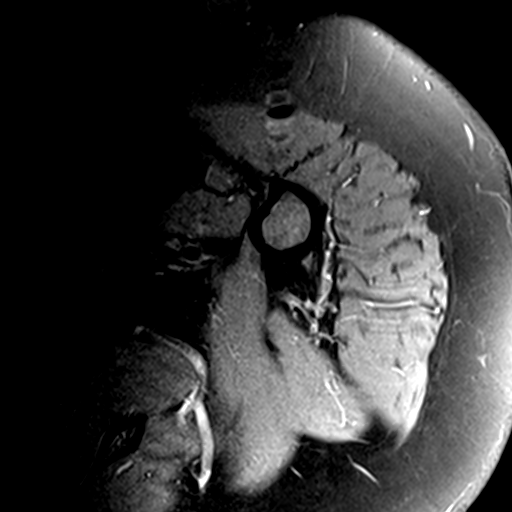
[im 3/17]
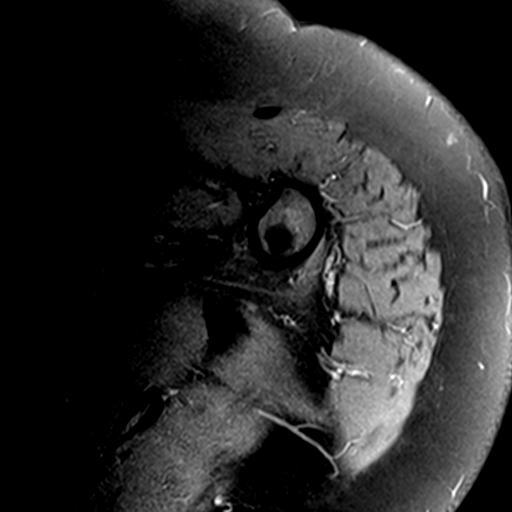
[im 5/17]
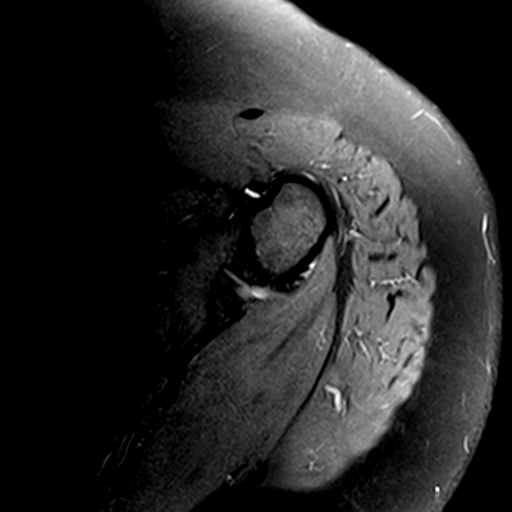
[im 7/17]
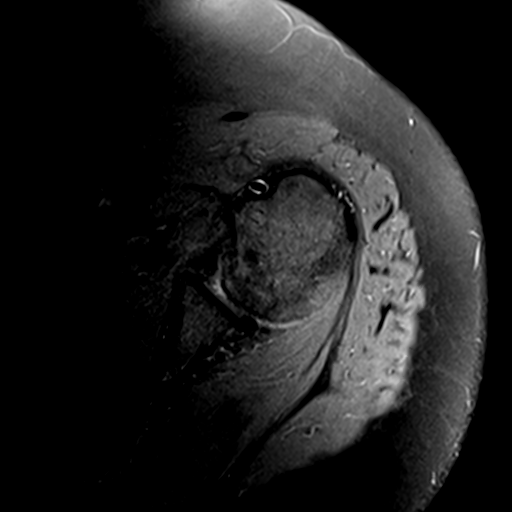
[im 9/17]
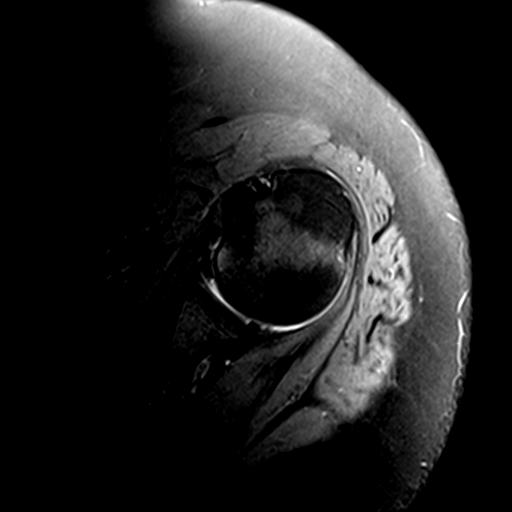
[im 11/17]
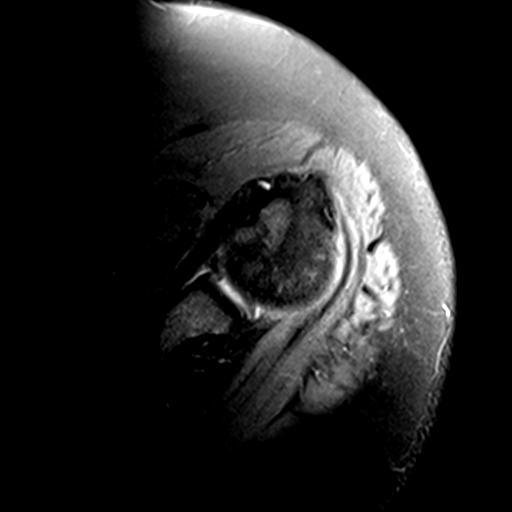
[im 13/17]
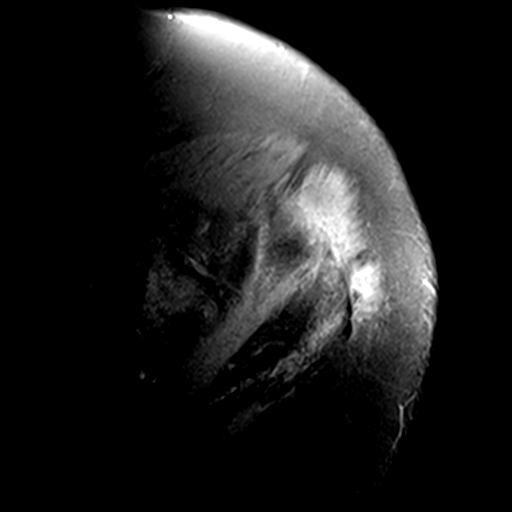
[im 15/17]
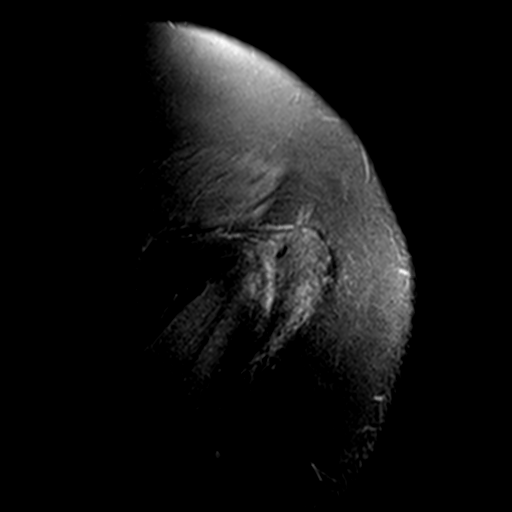
[im 17/17]
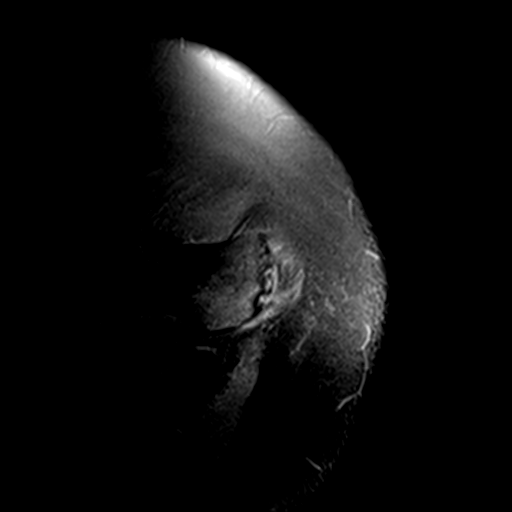

[Series 14: PD fat-sat · oblique · 4.0mm · 0.29mm/px · 8 of 15 slices shown (2 of 2)]
[im 1/15]
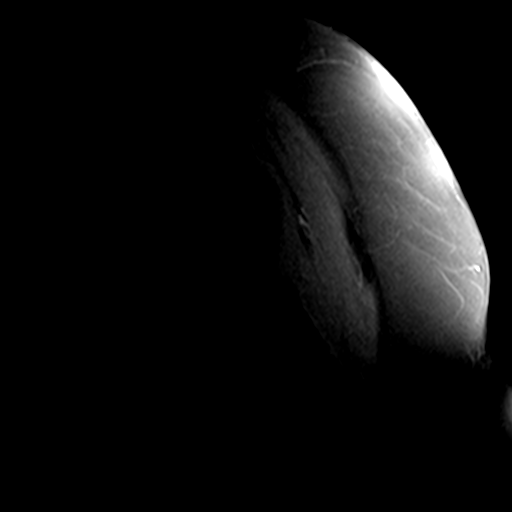
[im 3/15]
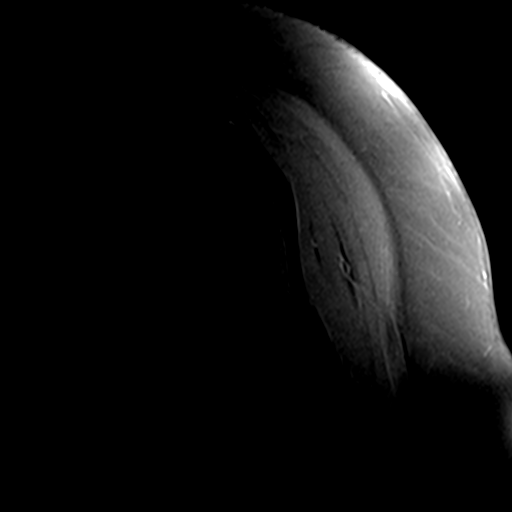
[im 5/15]
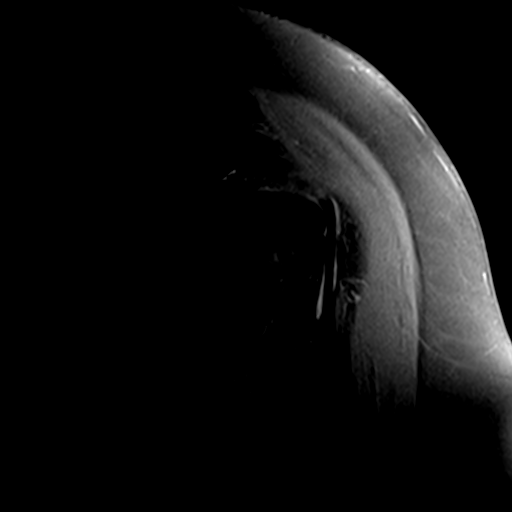
[im 7/15]
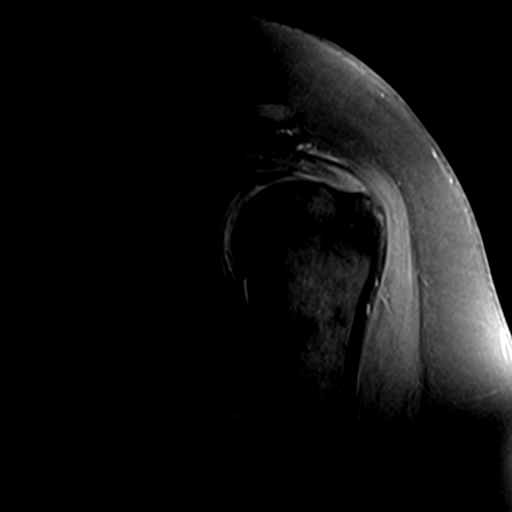
[im 9/15]
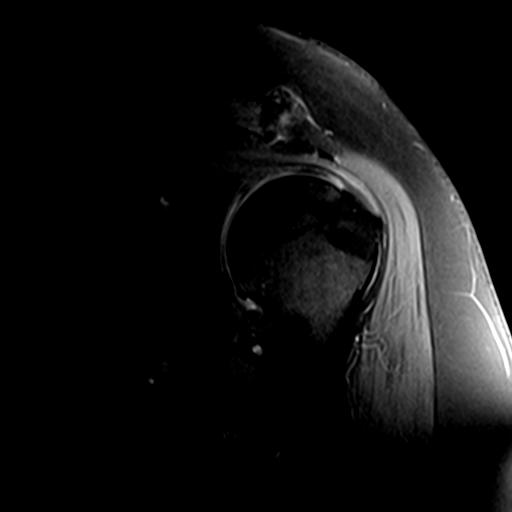
[im 11/15]
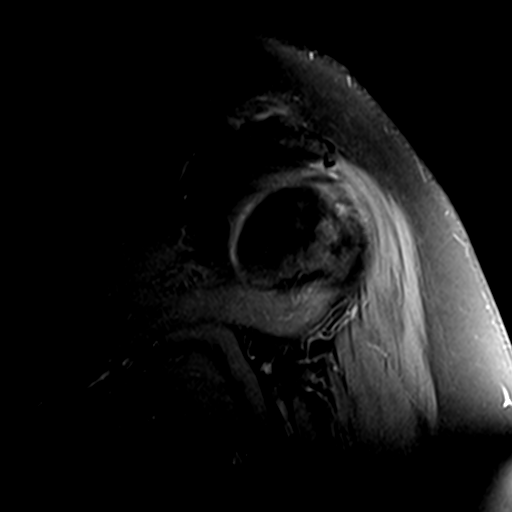
[im 13/15]
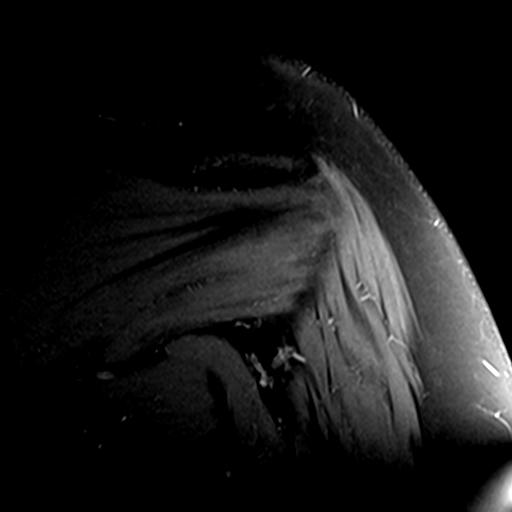
[im 15/15]
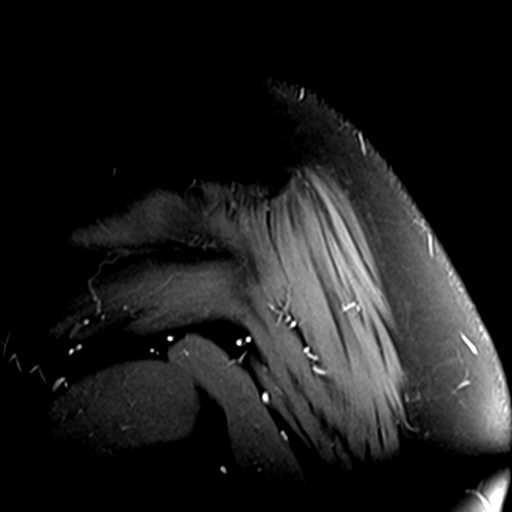

[Series 15: T2 fat-sat · oblique · 4.0mm · 0.59mm/px · 3 of 15 slices shown (2 of 2)]
[im 3/15]
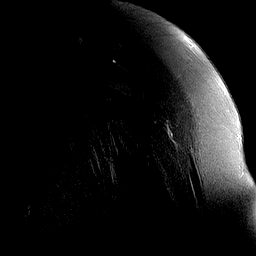
[im 9/15]
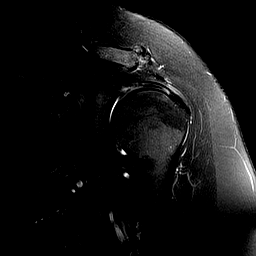
[im 13/15]
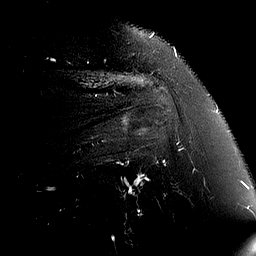

[25 of 40 positions shown; findings below may reference images not displayed]

FINDINGS: Rotator cuff: Mild supraspinatus and infraspinatus tendinosis
without discrete tear. Subscapularis and teres minor tendons intact.

Muscles: No atrophy or abnormal signal of the muscles of the rotator
cuff.

Biceps long head:  Intact.

Acromioclavicular Joint: Mild arthropathy of the acromioclavicular
joint. No subacromial/subdeltoid bursal fluid.

Glenohumeral Joint: Mild cartilage irregularity of the inferior
glenoid. No joint effusion.

Labrum: Posterosuperior labrum is diminutive in appearance and may
be degenerated and/or torn. Blunting and intermediate signal of the
anteroinferior labrum suggest degeneration.

Bones:  No marrow abnormality, fracture or dislocation.

Other: None.
IMPRESSION: 1. Mild supraspinatus and infraspinatus tendinosis without discrete
tear.
2. Labral degeneration with possible posterosuperior labral tear.
3. Mild acromioclavicular and glenohumeral osteoarthritis.

## 2021-09-16 MED ORDER — TIZANIDINE HCL 2 MG PO CAPS: 2.0000 mg | ORAL_CAPSULE | Freq: Every evening | ORAL | 0 refills | Status: DC | PRN

## 2021-09-16 NOTE — Telephone Encounter (Signed)
Copied from [place] [id number]. Topic: General - Other >> Sep 16, 2021  9:41 AM Leward Quan A wrote: Reason for CRM: Patient asking for Clifton James to give her a call back at  Ph# [phone]

## 2021-09-16 NOTE — Telephone Encounter (Signed)
I return Pt call, she will call back on 12/21 to check if the CAFA letter is ready

## 2021-09-16 NOTE — Progress Notes (Signed)
Office Visit Note   Patient: Elaine Burch           Date of Birth: 05/13/1966           MRN: [id number] Visit Date: 09/16/2021              Requested by: Camillia Herter, NP [address],  [place] [address] PCP: Camillia Herter, NP   Assessment & Plan: Visit Diagnoses:  1. Chronic bilateral low back pain with bilateral sciatica   2. Trochanteric bursitis, left hip   3. Trochanteric bursitis, right hip     Plan: She is currently waiting on her cone eligibility card.  Once this is available we will send her to formal physical therapy for IT band stretching, back exercises, core strengthening, hamstring stretching, home exercise program and modalities.  She will call us and let us know when she gets her COVID card.  Place her on Zanaflex to take at night before going to bed she does not operate any heavy equipment or vehicles while taking Zanaflex.  Questions were encouraged and answered at length today using interpreter.  We will see her back 6 weeks after starting therapy.  Follow-Up Instructions: No follow-ups on file.   Orders:  No orders of the defined types were placed in this encounter.  Meds ordered this encounter  Medications   tizanidine (ZANAFLEX) 2 MG capsule    Sig: Take 1 capsule (2 mg total) by mouth at bedtime as needed for muscle spasms.    Dispense:  30 capsule    Refill:  0      Procedures: No procedures performed   Clinical Data: No additional findings.   Subjective: Chief Complaint  Patient presents with   Lower Back - Pain    HPI Patient 55 year old female who returns today with new complaint of low back pain.  We have seen her in the past for shoulder pain and had discussed with her shoulder arthroscopy on the right shoulder.  She states she is still thinking about having shoulder surgery.  Regards low back pain she states is been ongoing for years.  No acute injury.  She did have radiographs of her lumbar spine back  in July of this year.  She has had no change in overall symptoms since July and no new injury.  Radiographs by my read show no acute fracture.  Slight loss of lordosis.  Lower lumbar facet changes mild to moderate.  L5-S1 disc space narrowing moderate.  No spinal listhesis no acute fractures.  She states she has no fevers chills.  No waking pain.  No numbness tingling down either leg.  No bowel or bladder dysfunction.  She does note that she has pain that radiates down both legs to her ankles.  She has low back pain with sitting for prolonged periods of time.  She is tried meloxicam which is helped some.  She is diabetic she is unsure of what her last hemoglobin A1c was but she states her glucose levels are running 110-1 27 at home.   Review of Systems See HPI otherwise negative or noncontributory.  Objective: Vital Signs: There were no vitals taken for this visit.  Physical Exam Constitutional:      Appearance: She is not ill-appearing or diaphoretic.  Pulmonary:     Effort: Pulmonary effort is normal.  Musculoskeletal:     Lumbar back: Negative right straight leg raise test and negative left straight leg raise test.  Neurological:  Mental Status: She is alert and oriented to person, place, and time.  Psychiatric:        Mood and Affect: Mood normal.    Back Exam   Tenderness  The patient is experiencing tenderness in the lumbar.  Range of Motion  Extension:  abnormal  Flexion:  normal   Tests  Straight leg raise right: negative Straight leg raise left: negative  Other  Sensation: normal Gait: normal     Tight hamstrings bilaterally.  Tenderness lower lumbar paraspinous region bilaterally.  Good range of motion bilateral hips.  Tenderness over the trochanteric region both hips right greater than left.  Dorsal pedal pulses are 2+ bilaterally. Specialty Comments:  No specialty comments available.  Imaging: No results found.   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 05/26/2021   Impingement syndrome of right shoulder 05/26/2021   Type 2 diabetes mellitus without complication, without long-term current use of insulin ([place]) 02/24/2021   Hyperlipidemia 02/24/2021   Muscle spasm of back 11/24/2013   Past Medical History:  Diagnosis Date   Arthritis    beginning   Diabetes mellitus without complication (HCC)    GERD (gastroesophageal reflux disease)    past hx of GERD   Hyperlipidemia     Family History  Problem Relation Age of Onset   Diabetes Mother    Cancer Father    Diabetes Brother    Breast cancer Neg Hx    Colon cancer Neg Hx    Colon polyps Neg Hx    Esophageal cancer Neg Hx    Rectal cancer Neg Hx    Stomach cancer Neg Hx     Past Surgical History:  Procedure Laterality Date   NO PAST SURGERIES     Social History   Occupational History   Not on file  Tobacco Use   Smoking status: Never   Smokeless tobacco: Never  Vaping Use   Vaping Use: Never used  Substance and Sexual Activity   Alcohol use: No   Drug use: No   Sexual activity: Yes    Birth control/protection: None

## 2021-10-19 ENCOUNTER — Encounter: Payer: Self-pay | Admitting: Orthopaedic Surgery

## 2021-10-19 ENCOUNTER — Other Ambulatory Visit: Payer: Self-pay

## 2021-10-19 ENCOUNTER — Ambulatory Visit (INDEPENDENT_AMBULATORY_CARE_PROVIDER_SITE_OTHER): Payer: Self-pay | Admitting: Orthopaedic Surgery

## 2021-10-19 DIAGNOSIS — M545 Low back pain, unspecified: Secondary | ICD-10-CM

## 2021-10-19 DIAGNOSIS — G8929 Other chronic pain: Secondary | ICD-10-CM

## 2021-10-19 DIAGNOSIS — M546 Pain in thoracic spine: Secondary | ICD-10-CM

## 2021-10-19 DIAGNOSIS — M7062 Trochanteric bursitis, left hip: Secondary | ICD-10-CM

## 2021-10-19 DIAGNOSIS — M7061 Trochanteric bursitis, right hip: Secondary | ICD-10-CM

## 2021-10-19 MED ORDER — MELOXICAM 15 MG PO TABS
15.0000 mg | ORAL_TABLET | Freq: Every day | ORAL | 6 refills | Status: DC | PRN
  Filled 2021-10-19: qty 30, 30d supply, fill #0

## 2021-10-19 MED ORDER — TIZANIDINE HCL 2 MG PO TABS
2.0000 mg | ORAL_TABLET | Freq: Three times a day (TID) | ORAL | 0 refills | Status: DC | PRN
  Filled 2021-10-19: qty 60, 20d supply, fill #0

## 2021-10-19 NOTE — Progress Notes (Signed)
The patient comes in today with continued thoracic and lumbar spine pain as well as pelvic pain.  She is 56 years old.  She does have an interpreter with her today.  She is now part of the [facility] discount program.  She has not been through physical therapy and notes that the 1 thing we really want her to be able to try.  She does report significant pain still.  She takes meloxicam and Zanaflex on occasion.  She has had no other acute change in her medical status.  There is no radicular component of her pain.  It does wake her up at night.  She did have a significant decrease in pain when she was on a Dosepak.  She is a diabetic and she understands we do not want to continue any steroids on her due to the detrimental effect this can have on blood glucose.  All of her pain seems to be in the lower lumbar spine as well as thoracic spine and the midline of the pelvis.  She is ambulating well and has good strength in her bilateral lower extremities.  We will try to get her set up for outpatient physical therapy through the [place] discount program at [facility] outpatient physical therapy on [address]  This will be for any modalities that can help decrease her thoracic and lumbar spine as well as pelvic pain.  I did refill her meloxicam and Zanaflex.  We can see her back in about 2 months to see how she is doing overall.

## 2021-10-21 ENCOUNTER — Other Ambulatory Visit: Payer: Self-pay

## 2021-10-22 ENCOUNTER — Other Ambulatory Visit: Payer: Self-pay | Admitting: Family

## 2021-10-22 ENCOUNTER — Other Ambulatory Visit: Payer: Self-pay

## 2021-10-22 ENCOUNTER — Telehealth: Payer: Self-pay | Admitting: Family

## 2021-10-22 DIAGNOSIS — E782 Mixed hyperlipidemia: Secondary | ICD-10-CM

## 2021-10-22 DIAGNOSIS — E119 Type 2 diabetes mellitus without complications: Secondary | ICD-10-CM

## 2021-10-22 MED ORDER — METFORMIN HCL 500 MG PO TABS
1000.0000 mg | ORAL_TABLET | Freq: Two times a day (BID) | ORAL | 0 refills | Status: DC
  Filled 2021-10-22: qty 120, 30d supply, fill #0

## 2021-10-22 MED ORDER — GLIPIZIDE 5 MG PO TABS
5.0000 mg | ORAL_TABLET | Freq: Every day | ORAL | 0 refills | Status: DC
  Filled 2021-10-22: qty 30, 30d supply, fill #0

## 2021-10-22 MED ORDER — ATORVASTATIN CALCIUM 20 MG PO TABS
20.0000 mg | ORAL_TABLET | Freq: Every day | ORAL | 0 refills | Status: DC
  Filled 2021-10-22: qty 30, 30d supply, fill #0

## 2021-10-22 NOTE — Telephone Encounter (Signed)
Rx #: [id number]  glucose blood (TRUE METRIX BLOOD GLUCOSE TEST) test strip [[id number]]   Rx #: [id number]  metFORMIN (GLUCOPHAGE) 500 MG tablet [[id number]]   Rx #: [id number]  atorvastatin (LIPITOR) 20 MG tablet [[id number]]   Rx #: [id number]  glipiZIDE (GLUCOTROL) 5 MG tablet [[id number]]   Rx #: [id number]  TRUEplus Lancets 28G MISC [address], [place] [address]  Phone:  --  DEA #:  [id number]

## 2021-10-22 NOTE — Telephone Encounter (Signed)
REFILL REQUEST COMPLETED

## 2021-10-23 NOTE — Progress Notes (Signed)
Erroneous encounter

## 2021-10-26 ENCOUNTER — Other Ambulatory Visit (HOSPITAL_COMMUNITY): Payer: Self-pay

## 2021-10-26 ENCOUNTER — Encounter (HOSPITAL_COMMUNITY): Payer: Self-pay

## 2021-10-26 ENCOUNTER — Inpatient Hospital Stay (HOSPITAL_COMMUNITY)
Admission: EM | Admit: 2021-10-26 | Discharge: 2021-10-27 | DRG: 301 | Disposition: A | Discharge status: Home / Self Care | Payer: Medicaid Other | Attending: Internal Medicine | Admitting: Internal Medicine

## 2021-10-26 ENCOUNTER — Emergency Department (HOSPITAL_COMMUNITY): Payer: Medicaid Other

## 2021-10-26 DIAGNOSIS — I7774 Dissection of vertebral artery: Principal | ICD-10-CM | POA: Diagnosis present

## 2021-10-26 DIAGNOSIS — Z833 Family history of diabetes mellitus: Secondary | ICD-10-CM

## 2021-10-26 DIAGNOSIS — Z7984 Long term (current) use of oral hypoglycemic drugs: Secondary | ICD-10-CM | POA: Diagnosis not present

## 2021-10-26 DIAGNOSIS — E119 Type 2 diabetes mellitus without complications: Secondary | ICD-10-CM | POA: Diagnosis present

## 2021-10-26 DIAGNOSIS — E785 Hyperlipidemia, unspecified: Secondary | ICD-10-CM | POA: Diagnosis present

## 2021-10-26 DIAGNOSIS — Z79899 Other long term (current) drug therapy: Secondary | ICD-10-CM | POA: Diagnosis not present

## 2021-10-26 DIAGNOSIS — K219 Gastro-esophageal reflux disease without esophagitis: Secondary | ICD-10-CM | POA: Diagnosis present

## 2021-10-26 DIAGNOSIS — Z20822 Contact with and (suspected) exposure to covid-19: Secondary | ICD-10-CM | POA: Diagnosis present

## 2021-10-26 LAB — CBC WITH DIFFERENTIAL/PLATELET
Abs Immature Granulocytes: 0.04 10*3/uL (ref 0.00–0.07)
Basophils Absolute: 0.1 10*3/uL (ref 0.0–0.1)
Basophils Relative: 0 %
Eosinophils Absolute: 0.1 10*3/uL (ref 0.0–0.5)
Eosinophils Relative: 1 %
HCT: 44.5 % (ref 36.0–46.0)
Hemoglobin: 15.1 g/dL — ABNORMAL HIGH (ref 12.0–15.0)
Immature Granulocytes: 0 %
Lymphocytes Relative: 19 %
Lymphs Abs: 2.2 10*3/uL (ref 0.7–4.0)
MCH: 31.6 pg (ref 26.0–34.0)
MCHC: 33.9 g/dL (ref 30.0–36.0)
MCV: 93.1 fL (ref 80.0–100.0)
Monocytes Absolute: 0.4 10*3/uL (ref 0.1–1.0)
Monocytes Relative: 4 %
Neutro Abs: 8.7 10*3/uL — ABNORMAL HIGH (ref 1.7–7.7)
Neutrophils Relative %: 76 %
Platelets: 185 10*3/uL (ref 150–400)
RBC: 4.78 MIL/uL (ref 3.87–5.11)
RDW: 12.7 % (ref 11.5–15.5)
WBC: 11.4 10*3/uL — ABNORMAL HIGH (ref 4.0–10.5)
nRBC: 0 % (ref 0.0–0.2)

## 2021-10-26 LAB — BASIC METABOLIC PANEL
Anion gap: 10 (ref 5–15)
BUN: 14 mg/dL (ref 6–20)
CO2: 23 mmol/L (ref 22–32)
Calcium: 9.8 mg/dL (ref 8.9–10.3)
Chloride: 104 mmol/L (ref 98–111)
Creatinine, Ser: 0.52 mg/dL (ref 0.44–1.00)
GFR, Estimated: >60 mL/min (ref >60)
Glucose, Bld: 208 mg/dL — ABNORMAL HIGH (ref 70–99)
Potassium: 3.5 mmol/L (ref 3.5–5.1)
Sodium: 137 mmol/L (ref 135–145)

## 2021-10-26 LAB — RESP PANEL BY RT-PCR (FLU A&B, COVID) ARPGX2
Influenza A by PCR: NEGATIVE
Influenza B by PCR: NEGATIVE
SARS Coronavirus 2 by RT PCR: NEGATIVE

## 2021-10-26 LAB — CBG MONITORING, ED
Glucose-Capillary: 109 mg/dL — ABNORMAL HIGH (ref 70–99)
Glucose-Capillary: 122 mg/dL — ABNORMAL HIGH (ref 70–99)
Glucose-Capillary: 140 mg/dL — ABNORMAL HIGH (ref 70–99)

## 2021-10-26 LAB — I-STAT CHEM 8, ED
BUN: 13 mg/dL (ref 6–20)
Calcium, Ion: 1.18 mmol/L (ref 1.15–1.40)
Chloride: 102 mmol/L (ref 98–111)
Creatinine, Ser: 0.4 mg/dL — ABNORMAL LOW (ref 0.44–1.00)
Glucose, Bld: 204 mg/dL — ABNORMAL HIGH (ref 70–99)
HCT: 47 % — ABNORMAL HIGH (ref 36.0–46.0)
Hemoglobin: 16 g/dL — ABNORMAL HIGH (ref 12.0–15.0)
Potassium: 3.5 mmol/L (ref 3.5–5.1)
Sodium: 140 mmol/L (ref 135–145)
TCO2: 25 mmol/L (ref 22–32)

## 2021-10-26 LAB — HEPARIN LEVEL (UNFRACTIONATED)
Heparin Unfractionated: 0.54 IU/mL (ref 0.30–0.70)
Heparin Unfractionated: 1.05 IU/mL — ABNORMAL HIGH (ref 0.30–0.70)

## 2021-10-26 LAB — PROTIME-INR
INR: 0.9 (ref 0.8–1.2)
Prothrombin Time: 12.5 seconds (ref 11.4–15.2)

## 2021-10-26 MED ORDER — INSULIN ASPART 100 UNIT/ML IJ SOLN: 0.0000 [IU] | Freq: Every day | INTRAMUSCULAR | Status: DC

## 2021-10-26 MED ORDER — HEPARIN (PORCINE) 25000 UT/250ML-% IV SOLN
1100.0000 [IU]/h | INTRAVENOUS | Status: DC
  Administered 2021-10-26: 1100 [IU]/h via INTRAVENOUS
  Filled 2021-10-26: qty 250

## 2021-10-26 MED ORDER — HEPARIN (PORCINE) 25000 UT/250ML-% IV SOLN
1300.0000 [IU]/h | INTRAVENOUS | Status: DC
  Administered 2021-10-26: 1300 [IU]/h via INTRAVENOUS
  Filled 2021-10-26: qty 250

## 2021-10-26 MED ORDER — ATORVASTATIN CALCIUM 10 MG PO TABS
20.0000 mg | ORAL_TABLET | Freq: Every day | ORAL | Status: DC
  Administered 2021-10-26 – 2021-10-27 (×2): 20 mg via ORAL
  Filled 2021-10-26 (×2): qty 2

## 2021-10-26 MED ORDER — FENTANYL CITRATE PF 50 MCG/ML IJ SOSY: 50.0000 ug | PREFILLED_SYRINGE | INTRAMUSCULAR | Status: DC | PRN

## 2021-10-26 MED ORDER — ASPIRIN 300 MG RE SUPP: 300.0000 mg | Freq: Every day | RECTAL | Status: DC

## 2021-10-26 MED ORDER — SENNOSIDES-DOCUSATE SODIUM 8.6-50 MG PO TABS: 1.0000 | ORAL_TABLET | Freq: Every evening | ORAL | Status: DC | PRN

## 2021-10-26 MED ORDER — STROKE: EARLY STAGES OF RECOVERY BOOK: Freq: Once | Status: DC

## 2021-10-26 MED ORDER — ASPIRIN 325 MG PO TABS
325.0000 mg | ORAL_TABLET | Freq: Every day | ORAL | Status: DC
  Administered 2021-10-26 – 2021-10-27 (×2): 325 mg via ORAL
  Filled 2021-10-26 (×2): qty 1

## 2021-10-26 MED ORDER — ONDANSETRON HCL 4 MG/2ML IJ SOLN
INTRAMUSCULAR | Status: AC
  Filled 2021-10-26: qty 2

## 2021-10-26 MED ORDER — ACETAMINOPHEN 160 MG/5ML PO SOLN: 650.0000 mg | ORAL | Status: DC | PRN

## 2021-10-26 MED ORDER — ACETAMINOPHEN 650 MG RE SUPP: 650.0000 mg | RECTAL | Status: DC | PRN

## 2021-10-26 MED ORDER — ACETAMINOPHEN 325 MG PO TABS
650.0000 mg | ORAL_TABLET | ORAL | Status: DC | PRN
  Administered 2021-10-27: 09:00:00 650 mg via ORAL
  Filled 2021-10-26: qty 2

## 2021-10-26 MED ORDER — TIZANIDINE HCL 4 MG PO TABS
2.0000 mg | ORAL_TABLET | Freq: Three times a day (TID) | ORAL | Status: DC | PRN
  Administered 2021-10-27: 09:00:00 2 mg via ORAL
  Filled 2021-10-26: qty 1

## 2021-10-26 MED ORDER — METOCLOPRAMIDE HCL 5 MG/ML IJ SOLN
10.0000 mg | Freq: Once | INTRAMUSCULAR | Status: AC
  Administered 2021-10-26: 10 mg via INTRAVENOUS
  Filled 2021-10-26: qty 2

## 2021-10-26 MED ORDER — IOHEXOL 350 MG/ML SOLN
75.0000 mL | Freq: Once | INTRAVENOUS | Status: AC | PRN
  Administered 2021-10-26: 75 mL via INTRAVENOUS

## 2021-10-26 MED ORDER — INSULIN ASPART 100 UNIT/ML IJ SOLN
0.0000 [IU] | Freq: Three times a day (TID) | INTRAMUSCULAR | Status: DC
  Administered 2021-10-26: 2 [IU] via SUBCUTANEOUS
  Administered 2021-10-27: 12:00:00 3 [IU] via SUBCUTANEOUS
  Administered 2021-10-27: 08:00:00 2 [IU] via SUBCUTANEOUS

## 2021-10-26 NOTE — Assessment & Plan Note (Addendum)
-  Patient with acute onset of symptoms during intercourse, concerning for vertebral artery dissection -Imaging indicates concern for the same -Vascular surgery has consulted -Given ongoing symptoms, she has been recommended for ongoing telemetry monitoring -Will admit  -Carotid dopplers -Heparin drip, ASA per vascular -PT/OT Consults

## 2021-10-26 NOTE — Progress Notes (Signed)
ANTICOAGULATION CONSULT NOTE - Follow-up Consult  Pharmacy Consult for heparin Indication:  vertebral dissection  No Known Allergies  Patient Measurements: Height: 5\' 2"  (157.5 cm) Weight: 82 kg (180 lb 12.4 oz) IBW/kg (Calculated) : 50.1 Heparin Dosing Weight: 82 kg   Vital Signs: BP: 99/66 (01/23 1300) Pulse Rate: 65 (01/23 1300)  Labs: Recent Labs    10/26/21 0238 10/26/21 0252 10/26/21 1236  HGB 15.1* 16.0*  --   HCT 44.5 47.0*  --   PLT 185  --   --   LABPROT 12.5  --   --   INR 0.9  --   --   HEPARINUNFRC  --   --  0.54  CREATININE 0.52 0.40*  --      Estimated Creatinine Clearance: 80.8 mL/min (A) (by C-G formula based on SCr of 0.4 mg/dL (L)).   Medical History: Past Medical History:  Diagnosis Date   Arthritis    beginning   Diabetes mellitus without complication (HCC)    GERD (gastroesophageal reflux disease)    past hx of GERD   Hyperlipidemia     Medications:  (Not in a hospital admission)  Assessment: 39 YOF who presented with neck pain and leg weakness with concern for vertebral dissection on CT. Pharmacy consulted to start IV heparin.   Heparin level 0.54 today on check and therapeutic. H/H elevated this morning, platelets within normal limits. No signs of bleeding noted.   Goal of Therapy:  Heparin level 0.3-0.7 units/ml Monitor platelets by anticoagulation protocol: Yes   Plan:  Continue IV heparin gtt at 1300 units/hr 6 hour heparin level check Daily heparin level, CBC Monitor for signs and symptoms of bleeding Follow-up vascular recommendations for oral anticoagulation  Thank you for involving pharmacy in this patient's care.  Elita Quick, PharmD PGY1 Ambulatory Care Pharmacy Resident 10/26/2021 2:25 PM  **Pharmacist phone directory can be found on [email or web address] listed under [place]**

## 2021-10-26 NOTE — Progress Notes (Signed)
ANTICOAGULATION CONSULT NOTE - Initial Consult  Pharmacy Consult for heparin Indication:  vertebral dissection  No Known Allergies  Patient Measurements: Height: 5\' 2"  (157.5 cm) Weight: 82 kg (180 lb 12.4 oz) IBW/kg (Calculated) : 50.1 Heparin Dosing Weight: 82 kg   Vital Signs: Temp: 97.7 F (36.5 C) (01/23 0204) Temp Source: Oral (01/23 0204) BP: 119/68 (01/23 0530) Pulse Rate: 74 (01/23 0530)  Labs: Recent Labs    10/26/21 0238 10/26/21 0252  HGB 15.1* 16.0*  HCT 44.5 47.0*  PLT 185  --   LABPROT 12.5  --   INR 0.9  --   CREATININE 0.52 0.40*    Estimated Creatinine Clearance: 80.8 mL/min (A) (by C-G formula based on SCr of 0.4 mg/dL (L)).   Medical History: Past Medical History:  Diagnosis Date   Arthritis    beginning   Diabetes mellitus without complication (HCC)    GERD (gastroesophageal reflux disease)    past hx of GERD   Hyperlipidemia     Medications:  (Not in a hospital admission)   Assessment: 53 YOF who presented with neck pain and leg weakness with concern for vertebral dissection on CT. Pharmacy consulted to start IV heparin.   H/H elevated. Plt wnl. SCr low.   Goal of Therapy:  Heparin level 0.3-0.7 units/ml Monitor platelets by anticoagulation protocol: Yes   Plan:  -Start heparin infusion at 1300 units/hr -F/u 6 hr HL -Monitor daily HL, CBC and s/s of bleeding   Albertina Parr, PharmD., BCCCP Clinical Pharmacist Please refer to [facility] for unit-specific pharmacist

## 2021-10-26 NOTE — ED Provider Notes (Signed)
[facility] Provider Note   CSN: [id number] Arrival date & time: 10/26/21  0157     History  Chief Complaint  Patient presents with   Headache    [facility] is a 56 y.o. female.  HPI  56 year old female with history of diabetes, hyperlipidemia comes in with chief complaint of severe headache.  Patient indicates that around 12:30 AM she was having intercourse with her husband.  Near the end of the intercourse she started having sudden onset severe headache.  The headache was located bilateral frontally and over the vertex.  The headache then moved to hurts the back of her head and neck.  She also started having nausea and vomiting and threw up about 8 times.  Review of system is also positive for dizziness and feeling like her legs bilaterally are weak.  Translation service was utilized for this visit.  Home Medications Prior to Admission medications   Medication Sig Start Date End Date Taking? Authorizing Provider  acetaminophen (TYLENOL) 500 MG tablet Take 500 mg by mouth every 6 (six) hours as needed for moderate pain or headache.   Yes [provider]  atorvastatin (LIPITOR) 20 MG tablet Take 1 tablet (20 mg total) by mouth daily. 10/22/21 11/21/21 Yes Minette Brine, Amy J, NP  Blood Glucose Monitoring Suppl (TRUE METRIX METER) w/Device KIT Use to check blood sugars 2-3 times per day Patient taking differently: in the morning and at bedtime. 08/09/19  Yes Fulp, Cammie, MD  glipiZIDE (GLUCOTROL) 5 MG tablet Take 1 tablet (5 mg total) by mouth daily before breakfast. 10/22/21  Yes Minette Brine, Amy J, NP  glucose blood (TRUE METRIX BLOOD GLUCOSE TEST) test strip USE AS INSTRUCTED TO CHECK BLOOD SUGAR 2-3 TIMES Patient taking differently: in the morning and at bedtime. 04/10/21 04/10/22 Yes Minette Brine, Amy J, NP  meloxicam (MOBIC) 15 MG tablet Take 1 tablet (15 mg total) by mouth daily as needed for pain. 10/19/21  Yes Mcarthur Rossetti, MD  metFORMIN (GLUCOPHAGE) 500 MG tablet Take 2 tablets (1,000 mg total) by mouth 2 (two) times daily with a meal. 10/22/21 11/21/21 Yes Minette Brine, Amy J, NP  tiZANidine (ZANAFLEX) 2 MG tablet Take 1 tablet (2 mg total) by mouth 3 (three) times daily as needed for muscle spasms. 10/19/21  Yes Mcarthur Rossetti, MD  TRUEplus Lancets 28G MISC USE TO CHECK BLOOD SUGAR 2-3 TIMES DAILY. Patient taking differently: in the morning and at bedtime. 12/15/20 12/15/21 Yes McClung, Dionne Bucy, PA-C  ibuprofen (ADVIL) 600 MG tablet Take 1 tablet (600 mg total) by mouth every 8 (eight) hours as needed for moderate pain. Take after eating Patient not taking: Reported on 05/25/2021 01/15/21   Argentina Donovan, PA-C      Allergies    Patient has no known allergies.    Review of Systems   Review of Systems  Physical Exam Updated Vital Signs BP 121/63    Pulse 73    Temp 97.7 F (36.5 C) (Oral)    Resp 17    Ht '5\' 2"'  (1.575 m)    Wt 82 kg    SpO2 99%    BMI 33.06 kg/m  Physical Exam Vitals and nursing note reviewed.  Constitutional:      Appearance: She is well-developed.  HENT:     Head: Atraumatic.  Eyes:     General: No visual field deficit. Cardiovascular:     Rate and Rhythm: Normal rate.  Pulmonary:     Effort:  Pulmonary effort is normal.  Musculoskeletal:     Cervical back: Normal range of motion.  Skin:    General: Skin is warm and dry.  Neurological:     Mental Status: She is alert and oriented to person, place, and time.     Cranial Nerves: No cranial nerve deficit, dysarthria or facial asymmetry.     Sensory: No sensory deficit.     Motor: No weakness.     Coordination: Coordination normal.    ED Results / Procedures / Treatments   Labs (all labs ordered are listed, but only abnormal results are displayed) Labs Reviewed  CBC WITH DIFFERENTIAL/PLATELET - Abnormal; Notable for the following components:      Result Value   WBC 11.4 (*)    Hemoglobin 15.1 (*)    Neutro Abs 8.7  (*)    All other components within normal limits  BASIC METABOLIC PANEL - Abnormal; Notable for the following components:   Glucose, Bld 208 (*)    All other components within normal limits  I-STAT CHEM 8, ED - Abnormal; Notable for the following components:   Creatinine, Ser 0.40 (*)    Glucose, Bld 204 (*)    Hemoglobin 16.0 (*)    HCT 47.0 (*)    All other components within normal limits  RESP PANEL BY RT-PCR (FLU A&B, COVID) ARPGX2  PROTIME-INR    EKG None  Radiology CT ANGIO HEAD NECK W WO CM (CODE STROKE)  Result Date: 10/26/2021 CLINICAL DATA:  Initial evaluation for acute headache. EXAM: CT ANGIOGRAPHY HEAD AND NECK TECHNIQUE: Multidetector CT imaging of the head and neck was performed using the standard protocol during bolus administration of intravenous contrast. Multiplanar CT image reconstructions and MIPs were obtained to evaluate the vascular anatomy. Carotid stenosis measurements (when applicable) are obtained utilizing NASCET criteria, using the distal internal carotid diameter as the denominator. RADIATION DOSE REDUCTION: This exam was performed according to the departmental dose-optimization program which includes automated exposure control, adjustment of the mA and/or kV according to patient size and/or use of iterative reconstruction technique. CONTRAST:  32m OMNIPAQUE IOHEXOL 350 MG/ML SOLN COMPARISON:  Prior study from 10/07/2019 FINDINGS: CT HEAD FINDINGS Brain: Cerebral volume within normal limits for patient age. No evidence for acute intracranial hemorrhage. No findings to suggest acute large vessel territory infarct. No mass lesion, midline shift, or mass effect. Ventricles are normal in size without evidence for hydrocephalus. No extra-axial fluid collection identified. Vascular: No hyperdense vessel identified. Skull: Scalp soft tissues demonstrate no acute abnormality. Calvarium intact. Sinuses/Orbits: Globes and orbital soft tissues within normal limits.  Visualized paranasal sinuses are clear. No mastoid effusion. CTA NECK FINDINGS Aortic arch: Visualized aortic arch normal caliber with normal branch pattern. No stenosis about the origin of the great vessels. Right carotid system: Right common and internal carotid arteries widely patent without stenosis, dissection or occlusion. Left carotid system: Left common and internal carotid arteries widely patent without stenosis, dissection or occlusion. Vertebral arteries: Both vertebral arteries arise from the subclavian arteries. No proximal subclavian artery stenosis. Right vertebral artery widely patent without abnormality. There is focal intimal irregularity with mural based hypodensity and mild narrowing of the distal left V3 segment, new from prior, and suspicious for possible dissection (series 11, image 169). No visible raised flap or frank filling defect. Skeleton: No discrete or worrisome osseous lesions. Other neck: No other acute soft tissue abnormality within the neck. Upper chest: Visualized upper chest demonstrates no acute finding. Subcentimeter calcified granuloma noted within  the left lung. Small bleb noted at the left upper lobe as well. Review of the MIP images confirms the above findings CTA HEAD FINDINGS Anterior circulation: Both internal carotid arteries widely patent to the termini without stenosis. A1 segments widely patent. Normal anterior communicating artery complex. Both anterior cerebral arteries widely patent to their distal aspects without stenosis. No M1 stenosis or occlusion. Normal MCA bifurcations. Distal MCA branches well perfused and symmetric. Posterior circulation: Both V4 segments patent to the vertebrobasilar junction without stenosis. Both PICA origins patent and normal. Basilar widely patent to its distal aspect without stenosis. Superior cerebellar arteries patent bilaterally. Both PCAs primarily supplied via the basilar and are well perfused to there distal aspects. Venous  sinuses: Patent allowing for timing the contrast bolus. Anatomic variants: None significant.  No aneurysm. Review of the MIP images confirms the above findings IMPRESSION: 1. Focal intimal irregularity with mural based hypodensity and mild narrowing of the distal left V3 segment, new from prior, and suspicious for acute dissection. No visible raised flap or frank filling defect. 2. Otherwise stable and negative CTA of the head and neck. No visible downstream embolic occlusion. 3. No other acute intracranial abnormality. Electronically Signed   By: Jeannine Boga M.D.   On: 10/26/2021 03:33    Procedures .Critical Care Performed by: Varney Biles, MD Authorized by: Varney Biles, MD   Critical care provider statement:    Critical care time (minutes):  40   Critical care was necessary to treat or prevent imminent or life-threatening deterioration of the following conditions:  Circulatory failure and CNS failure or compromise   Critical care was time spent personally by me on the following activities:  Development of treatment plan with patient or surrogate, discussions with consultants, evaluation of patient's response to treatment, examination of patient, ordering and review of laboratory studies, ordering and review of radiographic studies, ordering and performing treatments and interventions, pulse oximetry, re-evaluation of patient's condition and review of old charts    Medications Ordered in ED Medications  fentaNYL (SUBLIMAZE) injection 50 mcg (has no administration in time range)  ondansetron (ZOFRAN) 4 MG/2ML injection (  Given 10/26/21 0248)  iohexol (OMNIPAQUE) 350 MG/ML injection 75 mL (75 mLs Intravenous Contrast Given 10/26/21 0304)  metoCLOPramide (REGLAN) injection 10 mg (10 mg Intravenous Given 10/26/21 0442)    ED Course/ Medical Decision Making/ A&P Clinical Course as of 10/26/21 0526  Mon Oct 26, 2021  0438 Discussed case with Dr. Doren Custard.  He will review the image and  call me back with recommendations. [AN]  0526 CT ANGIO HEAD NECK W WO CM (CODE STROKE) Dr. Doren Custard has seen the patient.  He recommends anticoagulation and admission to medicine. [AN]  1610 Basic metabolic panel(!) CBC, BMP are reassuring. [AN]    Clinical Course User Index [AN] Varney Biles, MD                           Medical Decision Making Risk Prescription drug management. Decision regarding hospitalization.   This patient presents to the ED with chief complaint(s) of headaches with associated nausea and vomiting with pertinent past medical history of hyperlipidemia and diabetes which further complicates the presenting complaint. The complaint involves an extensive differential diagnosis and treatment options and also carries with it a high risk of complications and morbidity.    The differential diagnosis includes  : Subarachnoid hemorrhage, aneurysm, postcoital headache, vertebral dissection.  Patient is neurologic exam is reassuring, but she  is indicating that she has some dizziness and feels little weaker over both of her extremities.  Patient received CT angiogram head and neck, and there is concerns for questionable extracranial vertebral artery dissection.   Social Determinants of Health: Patients lack of insurance  increases the complexity of managing their presentation.  Additional history obtained: Records reviewed Primary Care Documents  Reassessment and review: Lab Tests: I Ordered, and personally interpreted labs.  The pertinent results include: Normal CBC and metabolic profile.  Imaging Studies ordered: CT angiogram of head and neck.   Cardiac Monitoring: The patient was maintained on a cardiac monitor.  I personally viewed and interpreted the cardiac monitor which showed an underlying rhythm of:  sinus rhythm  Medicines ordered and prescription drug management: I ordered the following medications IV fentanyl and IV Reglan for severe headache and  nausea.   Reevaluation of the patient after these medicines showed that the patient    improved  Consultations Obtained: I requested consultation with the consultant Dr. Doren Custard with vascular surgery , and discussed  findings as well as pertinent plan - they will see the patient and provide further recommendations.  Complexity of problems addressed: Patients presentation is most consistent with  acute presentation with potential threat to life or bodily function During patient's assessment  Disposition: Admission to medicine service with anticoagulation and need for frequent neurochecks.   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

## 2021-10-26 NOTE — Progress Notes (Signed)
Carotid duplex has been completed.   Preliminary results in CV Proc.   [facility] 10/26/2021 9:36 AM

## 2021-10-26 NOTE — ED Provider Notes (Signed)
Care of patient assumed from Dr. Kathrynn Humble at 7 AM.  This patient had sudden onset of postcoital headache and leg weakness.  CTA shows a possible vertebral artery dissection.  Vascular surgery has recommended initiation of heparin.  If her symptoms continue to improve, she can be transitioned to [facility].  She will need admission for anticoagulation and neurochecks. Physical Exam  BP 118/65    Pulse 90    Temp 97.7 F (36.5 C) (Oral)    Resp (!) 23    Ht 5\' 2"  (1.575 m)    Wt 82 kg    SpO2 96%    BMI 33.06 kg/m   Physical Exam Constitutional:      Appearance: She is well-developed.  HENT:     Head: Normocephalic and atraumatic.  Abdominal:     General: There is no distension.     Palpations: Abdomen is soft.  Skin:    General: Skin is warm and dry.  Neurological:     Mental Status: She is alert and oriented to person, place, and time.     Cranial Nerves: No cranial nerve deficit, dysarthria or facial asymmetry.     Sensory: No sensory deficit.     Motor: No weakness.  Psychiatric:        Mood and Affect: Mood normal.        Behavior: Behavior normal.    Procedures  Procedures  ED Course / MDM   Clinical Course as of 10/26/21 0724  Mon Oct 26, 2021  0438 Discussed case with Dr. Doren Custard.  He will review the image and call me back with recommendations. [AN]  0526 CT ANGIO HEAD NECK W WO CM (CODE STROKE) Dr. Doren Custard has seen the patient.  He recommends anticoagulation and admission to medicine. [AN]  6468 Basic metabolic panel(!) CBC, BMP are reassuring. [AN]    Clinical Course User Index [AN] Varney Biles, MD   Medical Decision Making Amount and/or Complexity of Data Reviewed Labs: ordered. Decision-making details documented in ED Course. Radiology:  Decision-making details documented in ED Course.  Risk Prescription drug management. Decision regarding hospitalization.   Patient resting comfortably on assessment.  She was admitted to hospitalist service for further  management.       Godfrey Pick, MD 10/27/21 [id number]

## 2021-10-26 NOTE — Assessment & Plan Note (Signed)
-  Continue Lipitor °

## 2021-10-26 NOTE — Assessment & Plan Note (Signed)
-  Last A1c was 6.1 -hold Glucophage and Glucotrol -Cover with moderate-scale SSI

## 2021-10-26 NOTE — Progress Notes (Signed)
ANTICOAGULATION CONSULT NOTE - Follow-up Consult  Pharmacy Consult for heparin Indication:  vertebral dissection  No Known Allergies  Patient Measurements: Height: 5\' 2"  (157.5 cm) Weight: 82 kg (180 lb 12.4 oz) IBW/kg (Calculated) : 50.1 Heparin Dosing Weight: 82 kg   Vital Signs: BP: 92/74 (01/23 1900) Pulse Rate: 65 (01/23 1900)  Labs: Recent Labs    10/26/21 0238 10/26/21 0252 10/26/21 1236 10/26/21 1945  HGB 15.1* 16.0*  --   --   HCT 44.5 47.0*  --   --   PLT 185  --   --   --   LABPROT 12.5  --   --   --   INR 0.9  --   --   --   HEPARINUNFRC  --   --  0.54 1.05*  CREATININE 0.52 0.40*  --   --      Estimated Creatinine Clearance: 80.8 mL/min (A) (by C-G formula based on SCr of 0.4 mg/dL (L)).   Medical History: Past Medical History:  Diagnosis Date   Arthritis    beginning   Diabetes mellitus without complication (HCC)    GERD (gastroesophageal reflux disease)    past hx of GERD   Hyperlipidemia     Medications:  (Not in a hospital admission)  Assessment: 62 YOF who presented with neck pain and leg weakness with concern for vertebral dissection on CT. Pharmacy consulted to start IV heparin.   Confirmatory heparin level back at 1.05 which is supratherapeutic. H/H elevated this morning, platelets within normal limits. No signs of bleeding noted.   Goal of Therapy:  Heparin level 0.3-0.7 units/ml Monitor platelets by anticoagulation protocol: Yes   Plan:  Hold infusion for 1 hour Reduce heparin gtt to 1100 units/hr 6 hour heparin level check Daily heparin level, CBC Monitor for signs and symptoms of bleeding Follow-up vascular recommendations for oral anticoagulation  Lorelei Pont, PharmD, BCPS 10/26/2021 8:41 PM ED Clinical Pharmacist -  [phone]

## 2021-10-26 NOTE — ED Triage Notes (Signed)
Pt arrives POV for eval of headache and vomiting.Pt reports that she was having sexual intercourse "became too excited" about it and developed sudden onset sharp HA w/ associated nausea and vomiting. Pt also reports associated dizziness, which is worse with movement.

## 2021-10-26 NOTE — Consult Note (Signed)
ASSESSMENT & PLAN   LEFT VERTEBRAL ARTERY DISSECTION: Although the findings on CT angio of the neck are somewhat underwhelming, the history does suggest a possible vertebral artery dissection.  She has headache, neck pain, some leg weakness, and dizziness.  The abnormality is noted in the V3 segment of the left vertebral artery.  This is not something that would be treated with surgery or an endovascular approach.  This is treated with anticoagulation.  I would recommend starting her on heparin given that she still having symptoms.  I would keep her systolic blood pressure less than 140.  If she improves she can then be converted to a DOAC.  I will arrange for a follow-up CT angio of the neck in 6 months.  I have also ordered a carotid duplex scan to have as a baseline.  Vascular surgery will follow.  REASON FOR CONSULT:    Left vertebral artery dissection  HPI:   [facility] is a 56 y.o. female who was having sex with her husband at 12:30 AM this morning.  She developed the sudden onset of a frontal headache, neck pain, and some leg weakness.  She later also noted some dizziness.  She presented to the emergency department.  Her work-up included a CT angiogram of the neck.  Radiology felt that she could possibly have a dissection in the V3 segment of the left vertebral artery.  For this reason vascular surgery was consulted.  The patient denies any previous history of stroke, TIAs, expressive or receptive aphasia, or amaurosis fugax.  Of note the history is all obtained through the translation device she does not speak [place].  She still has a headache (6 out of 10).  Her leg weakness is better.  Of note, she also tells me she is having some mild chest pain.  She denies shortness of breath.  Her risk factors for peripheral arterial disease include diabetes and hypercholesterolemia.  She denies any history of hypertension, family history of premature cardiovascular disease, or tobacco  use.  Past Medical History:  Diagnosis Date   Arthritis    beginning   Diabetes mellitus without complication (HCC)    GERD (gastroesophageal reflux disease)    past hx of GERD   Hyperlipidemia     Family History  Problem Relation Age of Onset   Diabetes Mother    Cancer Father    Diabetes Brother    Breast cancer Neg Hx    Colon cancer Neg Hx    Colon polyps Neg Hx    Esophageal cancer Neg Hx    Rectal cancer Neg Hx    Stomach cancer Neg Hx     SOCIAL HISTORY: Social History   Tobacco Use   Smoking status: Never   Smokeless tobacco: Never  Substance Use Topics   Alcohol use: No    No Known Allergies  Current Facility-Administered Medications  Medication Dose Route Frequency Provider Last Rate Last Admin   fentaNYL (SUBLIMAZE) injection 50 mcg  50 mcg Intravenous Q1H PRN Varney Biles, MD       Current Outpatient Medications  Medication Sig Dispense Refill   acetaminophen (TYLENOL) 500 MG tablet Take 500 mg by mouth every 6 (six) hours as needed for moderate pain or headache.     atorvastatin (LIPITOR) 20 MG tablet Take 1 tablet (20 mg total) by mouth daily. 30 tablet 0   Blood Glucose Monitoring Suppl (TRUE METRIX METER) w/Device KIT Use to check blood sugars 2-3 times per day (Patient  taking differently: in the morning and at bedtime.) 1 kit 0   glipiZIDE (GLUCOTROL) 5 MG tablet Take 1 tablet (5 mg total) by mouth daily before breakfast. 30 tablet 0   glucose blood (TRUE METRIX BLOOD GLUCOSE TEST) test strip USE AS INSTRUCTED TO CHECK BLOOD SUGAR 2-3 TIMES (Patient taking differently: in the morning and at bedtime.) 100 strip 9   meloxicam (MOBIC) 15 MG tablet Take 1 tablet (15 mg total) by mouth daily as needed for pain. 30 tablet 6   metFORMIN (GLUCOPHAGE) 500 MG tablet Take 2 tablets (1,000 mg total) by mouth 2 (two) times daily with a meal. 120 tablet 0   tiZANidine (ZANAFLEX) 2 MG tablet Take 1 tablet (2 mg total) by mouth 3 (three) times daily as needed for  muscle spasms. 60 tablet 0   TRUEplus Lancets 28G MISC USE TO CHECK BLOOD SUGAR 2-3 TIMES DAILY. (Patient taking differently: in the morning and at bedtime.) 100 each 11   ibuprofen (ADVIL) 600 MG tablet Take 1 tablet (600 mg total) by mouth every 8 (eight) hours as needed for moderate pain. Take after eating (Patient not taking: Reported on 05/25/2021) 60 tablet 2    REVIEW OF SYSTEMS:  _0  denotes positive finding, _1  denotes negative finding Cardiac  Comments:  Chest pain or chest pressure: X   Shortness of breath upon exertion:    Short of breath when lying flat:    Irregular heart rhythm:        Vascular    Pain in calf, thigh, or hip brought on by ambulation:    Pain in feet at night that wakes you up from your sleep:     Blood clot in your veins:    Leg swelling:         Pulmonary    Oxygen at home:    Productive cough:     Wheezing:         Neurologic    Sudden weakness in arms or legs:  x   Sudden numbness in arms or legs:     Sudden onset of difficulty speaking or slurred speech:    Temporary loss of vision in one eye:     Problems with dizziness:  x       Gastrointestinal    Blood in stool:     Vomited blood:         Genitourinary    Burning when urinating:     Blood in urine:        Psychiatric    Major depression:         Hematologic    Bleeding problems:    Problems with blood clotting too easily:        Skin    Rashes or ulcers:        Constitutional    Fever or chills:    -  PHYSICAL EXAM:   Vitals:   10/26/21 0217 10/26/21 0415 10/26/21 0430 10/26/21 0515  BP:  119/68 110/60 121/63  Pulse:  77 77 73  Resp:  _2 Temp:      TempSrc:      SpO2:  98% 97% 99%  Weight: 82 kg     Height: _3  (1.575 m)      Body mass index is 33.06 kg/m.  GENERAL: The patient is a well-nourished female, in no acute distress. The vital signs are documented above. CARDIAC: There is a regular rate and rhythm.  VASCULAR: I do not  detect carotid  bruits. He has palpable femoral and posterior tibial pulses. PULMONARY: There is good air exchange bilaterally without wheezing or rales. ABDOMEN: Soft and non-tender with normal pitched bowel sounds.  MUSCULOSKELETAL: There are no major deformities. NEUROLOGIC: I do not detect any focal weakness or paresthesias on exam. SKIN: There are no ulcers or rashes noted. PSYCHIATRIC: The patient has a normal affect.  DATA:    CT ANGIO NECK: I reviewed the images of her CT angiogram of the neck.  The area of concern on the CT is the V3 segment of the left vertebral artery near the base of the skull.  CT HEAD: CT of the head is unremarkable.  Deitra Mayo [facility]

## 2021-10-26 NOTE — H&P (Signed)
History and Physical    PatientYezenia Fredrick Burch ZJQ:[id number] DOB: [birth year] DOA: 10/26/2021 DOS: the patient was seen and examined on 10/26/2021 PCP: Dorna Mai, MD  Patient coming from: Home - lives with husband; NOK: Tommie Ard, [phone]   Chief Complaint: headache  HPI: Elaine Burch is a 56 y.o. female with medical history significant of DM and HLD presenting with acute neck pain and dizziness that occurred during intercourse this AM.   She reports that she was having intercourse with her husband this AM when she developed acute onset of left temporal and forehead headache followed by emesis x 2.  She then developed pain in her left neck which radiated to the R neck.  She had dizziness with standing and attempting to walk.  No other N/W/T of arms/legs.  History obtained via Development worker, community.   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  Heparin drip, neuro checks.  Vascular surgery consulting.    Review of Systems: As mentioned in the history of present illness. All other systems reviewed and are negative. Past Medical History:  Diagnosis Date   Arthritis    beginning   Diabetes mellitus without complication (HCC)    GERD (gastroesophageal reflux disease)    past hx of GERD   Hyperlipidemia    Past Surgical History:  Procedure Laterality Date   NO PAST SURGERIES     Social History:  reports that she has never smoked. She has never used smokeless tobacco. She reports that she does not drink alcohol and does not use drugs.  No Known Allergies  Family History  Problem Relation Age of Onset   Diabetes Mother    Cancer Father    Diabetes Brother    Breast cancer Neg Hx    Colon cancer Neg Hx    Colon polyps Neg Hx    Esophageal cancer Neg Hx    Rectal cancer Neg Hx    Stomach cancer Neg Hx     Prior to Admission medications   Medication Sig Start Date End Date Taking? Authorizing Provider   acetaminophen (TYLENOL) 500 MG tablet Take 500 mg by mouth every 6 (six) hours as needed for moderate pain or headache.   Yes [provider]  atorvastatin (LIPITOR) 20 MG tablet Take 1 tablet (20 mg total) by mouth daily. 10/22/21 11/21/21 Yes Minette Brine, Amy J, NP  Blood Glucose Monitoring Suppl (TRUE METRIX METER) w/Device KIT Use to check blood sugars 2-3 times per day Patient taking differently: in the morning and at bedtime. 08/09/19  Yes Fulp, Cammie, MD  glipiZIDE (GLUCOTROL) 5 MG tablet Take 1 tablet (5 mg total) by mouth daily before breakfast. 10/22/21  Yes Minette Brine, Amy J, NP  glucose blood (TRUE METRIX BLOOD GLUCOSE TEST) test strip USE AS INSTRUCTED TO CHECK BLOOD SUGAR 2-3 TIMES Patient taking differently: in the morning and at bedtime. 04/10/21 04/10/22 Yes Minette Brine, Amy J, NP  meloxicam (MOBIC) 15 MG tablet Take 1 tablet (15 mg total) by mouth daily as needed for pain. 10/19/21  Yes Mcarthur Rossetti, MD  metFORMIN (GLUCOPHAGE) 500 MG tablet Take 2 tablets (1,000 mg total) by mouth 2 (two) times daily with a meal. 10/22/21 11/21/21 Yes Minette Brine, Amy J, NP  tiZANidine (ZANAFLEX) 2 MG tablet Take 1 tablet (2 mg total) by mouth 3 (three) times daily as needed for muscle spasms. 10/19/21  Yes Mcarthur Rossetti, MD  TRUEplus Lancets 28G MISC USE TO CHECK BLOOD SUGAR 2-3 TIMES DAILY. Patient  taking differently: in the morning and at bedtime. 12/15/20 12/15/21 Yes McClung, Dionne Bucy, PA-C  ibuprofen (ADVIL) 600 MG tablet Take 1 tablet (600 mg total) by mouth every 8 (eight) hours as needed for moderate pain. Take after eating Patient not taking: Reported on 05/25/2021 01/15/21   Argentina Donovan, [place]    Physical Exam: Vitals:   10/26/21 0800 10/26/21 1200 10/26/21 1300 10/26/21 1630  BP: (!) 94/54 1'14/83 99/66 96/68 '  Pulse: 76 74 65 64  Resp: 20 (!) 22 (!) 21 18  Temp:      TempSrc:      SpO2: 93% 96% 93% 95%  Weight:      Height:       General:  Appears calm and  comfortable and is in NAD Eyes:  EOMI, normal lids, iris ENT:  grossly normal hearing, lips & tongue, mmm Neck:  no LAD, masses or thyromegaly; no carotid bruits; R neck TTP still present Cardiovascular:  RRR, no m/r/g. No LE edema.  Respiratory:   CTA bilaterally with no wheezes/rales/rhonchi.  Normal respiratory effort. Abdomen:  soft, NT, ND Skin:  no rash or induration seen on limited exam Musculoskeletal:  grossly normal tone BUE/BLE, good ROM, no bony abnormality Psychiatric:  grossly normal mood and affect, speech fluent and appropriate, AOx3 Neurologic:  CN 2-12 grossly intact, moves all extremities in coordinated fashion   Radiological Exams on Admission: Independently reviewed - see discussion in A/P where applicable  VAS US CAROTID  Result Date: 10/26/2021 Carotid Arterial Duplex Study Patient Name:  Elaine Burch  Date of Exam:   10/26/2021 Medical Rec #: [id number]               Accession #:    [id number] Date of Birth: [birth year]               Patient Gender: F Patient Age:   67 years Exam Location:  [facility] Procedure:      VAS US CAROTID Referring Phys: Harrell Gave DICKSON --------------------------------------------------------------------------------  Indications:       Correlate to CTA neck finding possible vertebral artery                    dissection. Risk Factors:      Hyperlipidemia, Diabetes. Comparison Study:  no prior Performing Technologist: Archie Patten RVS  Examination Guidelines: A complete evaluation includes B-mode imaging, spectral Doppler, color Doppler, and power Doppler as needed of all accessible portions of each vessel. Bilateral testing is considered an integral part of a complete examination. Limited examinations for reoccurring indications may be performed as noted.  Right Carotid Findings: +----------+--------+--------+--------+------------------+--------+             PSV cm/s EDV cm/s Stenosis Plaque Description Comments   +----------+--------+--------+--------+------------------+--------+  CCA Prox   93       15                heterogenous                 +----------+--------+--------+--------+------------------+--------+  CCA Distal 85       21                heterogenous                 +----------+--------+--------+--------+------------------+--------+  ICA Prox   72       21       1-39%    heterogenous                 +----------+--------+--------+--------+------------------+--------+  ICA Distal 89       30                                             +----------+--------+--------+--------+------------------+--------+  ECA        141      16                                             +----------+--------+--------+--------+------------------+--------+ +----------+--------+-------+--------+-------------------+             PSV cm/s EDV cms Describe Arm Pressure (mmHG)  +----------+--------+-------+--------+-------------------+  Subclavian 117                                            +----------+--------+-------+--------+-------------------+ +---------+--------+--+--------+--+---------+  Vertebral PSV cm/s 34 EDV cm/s 12 Antegrade  +---------+--------+--+--------+--+---------+  Left Carotid Findings: +----------+--------+--------+--------+------------------+--------+             PSV cm/s EDV cm/s Stenosis Plaque Description Comments  +----------+--------+--------+--------+------------------+--------+  CCA Prox   103      24                heterogenous                 +----------+--------+--------+--------+------------------+--------+  CCA Distal 80       23                heterogenous                 +----------+--------+--------+--------+------------------+--------+  ICA Prox   72       27       1-39%    heterogenous                 +----------+--------+--------+--------+------------------+--------+  ICA Distal 62       28                                              +----------+--------+--------+--------+------------------+--------+  ECA        112      17                                             +----------+--------+--------+--------+------------------+--------+ +----------+--------+--------+--------+-------------------+             PSV cm/s EDV cm/s Describe Arm Pressure (mmHG)  +----------+--------+--------+--------+-------------------+  Subclavian 182                                             +----------+--------+--------+--------+-------------------+ +---------+--------+--+--------+--+---------+  Vertebral PSV cm/s 49 EDV cm/s 18 Antegrade  +---------+--------+--+--------+--+---------+   Summary: Right Carotid: Velocities in the right ICA are consistent with a 1-39% stenosis. Left Carotid: Velocities in the left ICA are consistent with a 1-39% stenosis. Vertebrals: Bilateral vertebral arteries demonstrate antegrade flow. *See table(s) above for measurements and observations.  Preliminary    CT ANGIO HEAD NECK W WO CM (CODE STROKE)  Result Date: 10/26/2021 CLINICAL DATA:  Initial evaluation for acute headache. EXAM: CT ANGIOGRAPHY HEAD AND NECK TECHNIQUE: Multidetector CT imaging of the head and neck was performed using the standard protocol during bolus administration of intravenous contrast. Multiplanar CT image reconstructions and MIPs were obtained to evaluate the vascular anatomy. Carotid stenosis measurements (when applicable) are obtained utilizing NASCET criteria, using the distal internal carotid diameter as the denominator. RADIATION DOSE REDUCTION: This exam was performed according to the departmental dose-optimization program which includes automated exposure control, adjustment of the mA and/or kV according to patient size and/or use of iterative reconstruction technique. CONTRAST:  13m OMNIPAQUE IOHEXOL 350 MG/ML SOLN COMPARISON:  Prior study from 10/07/2019 FINDINGS: CT HEAD FINDINGS Brain: Cerebral volume within normal limits for patient age. No  evidence for acute intracranial hemorrhage. No findings to suggest acute large vessel territory infarct. No mass lesion, midline shift, or mass effect. Ventricles are normal in size without evidence for hydrocephalus. No extra-axial fluid collection identified. Vascular: No hyperdense vessel identified. Skull: Scalp soft tissues demonstrate no acute abnormality. Calvarium intact. Sinuses/Orbits: Globes and orbital soft tissues within normal limits. Visualized paranasal sinuses are clear. No mastoid effusion. CTA NECK FINDINGS Aortic arch: Visualized aortic arch normal caliber with normal branch pattern. No stenosis about the origin of the great vessels. Right carotid system: Right common and internal carotid arteries widely patent without stenosis, dissection or occlusion. Left carotid system: Left common and internal carotid arteries widely patent without stenosis, dissection or occlusion. Vertebral arteries: Both vertebral arteries arise from the subclavian arteries. No proximal subclavian artery stenosis. Right vertebral artery widely patent without abnormality. There is focal intimal irregularity with mural based hypodensity and mild narrowing of the distal left V3 segment, new from prior, and suspicious for possible dissection (series 11, image 169). No visible raised flap or frank filling defect. Skeleton: No discrete or worrisome osseous lesions. Other neck: No other acute soft tissue abnormality within the neck. Upper chest: Visualized upper chest demonstrates no acute finding. Subcentimeter calcified granuloma noted within the left lung. Small bleb noted at the left upper lobe as well. Review of the MIP images confirms the above findings CTA HEAD FINDINGS Anterior circulation: Both internal carotid arteries widely patent to the termini without stenosis. A1 segments widely patent. Normal anterior communicating artery complex. Both anterior cerebral arteries widely patent to their distal aspects without  stenosis. No M1 stenosis or occlusion. Normal MCA bifurcations. Distal MCA branches well perfused and symmetric. Posterior circulation: Both V4 segments patent to the vertebrobasilar junction without stenosis. Both PICA origins patent and normal. Basilar widely patent to its distal aspect without stenosis. Superior cerebellar arteries patent bilaterally. Both PCAs primarily supplied via the basilar and are well perfused to there distal aspects. Venous sinuses: Patent allowing for timing the contrast bolus. Anatomic variants: None significant.  No aneurysm. Review of the MIP images confirms the above findings IMPRESSION: 1. Focal intimal irregularity with mural based hypodensity and mild narrowing of the distal left V3 segment, new from prior, and suspicious for acute dissection. No visible raised flap or frank filling defect. 2. Otherwise stable and negative CTA of the head and neck. No visible downstream embolic occlusion. 3. No other acute intracranial abnormality. Electronically Signed   By: BJeannine BogaM.D.   On: 10/26/2021 03:33    EKG: Independently reviewed.  NSR with rate 75; no evidence of acute ischemia   Labs on Admission:  I have personally reviewed the available labs and imaging studies at the time of the admission.  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) -Patient with acute onset of symptoms during intercourse, concerning for vertebral artery dissection -Imaging indicates concern for the same -Vascular surgery has consulted -Given ongoing symptoms, she has been recommended for ongoing telemetry monitoring -Will admit  -Carotid dopplers -Heparin drip, ASA per vascular -PT/OT Consults  Hyperlipidemia- (present on admission) -Continue Lipitor  Type 2 diabetes mellitus without complication, without long-term current use of insulin (HCC) -Last A1c was 6.1 -hold Glucophage  and Glucotrol -Cover with moderate-scale SSI    Advance Care Planning:   Code Status: Full Code   Consults: Vascular surgery; PT/OT  Family Communication: None present  Severity of Illness: The appropriate patient status for this patient is INPATIENT. Inpatient status is judged to be reasonable and necessary in order to provide the required intensity of service to ensure the patient's safety. The patient's presenting symptoms, physical exam findings, and initial radiographic and laboratory data in the context of their chronic comorbidities is felt to place them at high risk for further clinical deterioration. Furthermore, it is not anticipated that the patient will be medically stable for discharge from the hospital within 2 midnights of admission.   * I certify that at the point of admission it is my clinical judgment that the patient will require inpatient hospital care spanning beyond 2 midnights from the point of admission due to high intensity of service, high risk for further deterioration and high frequency of surveillance required.*  Author: Karmen Bongo, MD 10/26/2021 5:50 PM  For on call review [email or web address].

## 2021-10-26 NOTE — ED Provider Notes (Signed)
It was noted that patient's CT angio of head and neck reveals probable new dissection,  charge nurse was made aware and patient will be brought back to room immediately, Dr. Kathrynn Humble was also made aware of these findings and will come and assess the patient.   Marcello Fennel, PA-C 10/26/21 [id number]    Varney Biles, MD 10/26/21 [phone]

## 2021-10-26 NOTE — ED Provider Triage Note (Signed)
Emergency Medicine Provider Triage Evaluation Note  Elaine Burch , a 56 y.o. female  was evaluated in triage.  Pt complains of headache nausea and vomiting, started proxy 1 hour ago, after she had colitis, headache is mainly in the front of her head, states she feels lightheaded and nauseous, denies any change in vision numbness in the upper lower extremities, states she feels slightly weak in her lower extremities bilaterally, has some pain in the back of her neck but pain is mostly in the front of her head throbbing-like sensation,  this is not the worst headache of her life.   Review of Systems  Positive: Headaches, nausea Negative: Change in vision, paresthesias  Physical Exam  BP (!) 148/94 (BP Location: Right Arm)    Pulse 82    Temp 97.7 F (36.5 C) (Oral)    Resp 18    Ht 5\' 2"  (1.575 m)    Wt 82 kg    SpO2 98%    BMI 33.06 kg/m  Gen:   Awake, no distress   Resp:  Normal effort  MSK:   Moves extremities without difficulty  Other:  No facial asymmetry, no difficult word finding, a follow two-step commands, no unilateral weakness present.  Medical Decision Making  Medically screening exam initiated at 2:45 AM.  Appropriate orders placed.  Ailanie Golay was informed that the remainder of the evaluation will be completed by another provider, this initial triage assessment does not replace that evaluation, and the importance of remaining in the ED until their evaluation is complete.  Presents with a headache and nausea, I am concerned for possible ruptured aneurysm, will obtain i-STAT, CT angio head neck we will bring back to scanning for further evaluation.   Marcello Fennel, PA-C 10/26/21 [phone]

## 2021-10-27 ENCOUNTER — Encounter: Payer: Self-pay | Admitting: Family

## 2021-10-27 ENCOUNTER — Other Ambulatory Visit: Payer: Self-pay

## 2021-10-27 ENCOUNTER — Other Ambulatory Visit (HOSPITAL_COMMUNITY): Payer: Self-pay

## 2021-10-27 DIAGNOSIS — E119 Type 2 diabetes mellitus without complications: Secondary | ICD-10-CM

## 2021-10-27 DIAGNOSIS — Z7982 Long term (current) use of aspirin: Secondary | ICD-10-CM

## 2021-10-27 DIAGNOSIS — Z789 Other specified health status: Secondary | ICD-10-CM

## 2021-10-27 DIAGNOSIS — I7774 Dissection of vertebral artery: Secondary | ICD-10-CM

## 2021-10-27 DIAGNOSIS — Z7902 Long term (current) use of antithrombotics/antiplatelets: Secondary | ICD-10-CM

## 2021-10-27 LAB — HEMOGLOBIN A1C
Hgb A1c MFr Bld: 6.9 % — ABNORMAL HIGH (ref 4.8–5.6)
Mean Plasma Glucose: 151 mg/dL

## 2021-10-27 LAB — LIPID PANEL
Cholesterol: 147 mg/dL (ref 0–200)
HDL: 63 mg/dL (ref >40)
LDL Cholesterol: 56 mg/dL (ref 0–99)
Total CHOL/HDL Ratio: 2.3 RATIO
Triglycerides: 141 mg/dL (ref <150)
VLDL: 28 mg/dL (ref 0–40)

## 2021-10-27 LAB — GLUCOSE, CAPILLARY
Glucose-Capillary: 170 mg/dL — ABNORMAL HIGH (ref 70–99)
Glucose-Capillary: 79 mg/dL (ref 70–99)

## 2021-10-27 LAB — CBC
HCT: 40.7 % (ref 36.0–46.0)
Hemoglobin: 14 g/dL (ref 12.0–15.0)
MCH: 31.8 pg (ref 26.0–34.0)
MCHC: 34.4 g/dL (ref 30.0–36.0)
MCV: 92.5 fL (ref 80.0–100.0)
Platelets: 184 10*3/uL (ref 150–400)
RBC: 4.4 MIL/uL (ref 3.87–5.11)
RDW: 13.1 % (ref 11.5–15.5)
WBC: 6 10*3/uL (ref 4.0–10.5)
nRBC: 0 % (ref 0.0–0.2)

## 2021-10-27 LAB — CBG MONITORING, ED: Glucose-Capillary: 131 mg/dL — ABNORMAL HIGH (ref 70–99)

## 2021-10-27 LAB — HEPARIN LEVEL (UNFRACTIONATED)
Heparin Unfractionated: 0.66 IU/mL (ref 0.30–0.70)
Heparin Unfractionated: 0.41 IU/mL (ref 0.30–0.70)

## 2021-10-27 MED ORDER — PNEUMOCOCCAL VAC POLYVALENT 25 MCG/0.5ML IJ INJ: 0.5000 mL | INJECTION | INTRAMUSCULAR | Status: DC

## 2021-10-27 MED ORDER — APIXABAN 5 MG PO TABS
5.0000 mg | ORAL_TABLET | Freq: Two times a day (BID) | ORAL | 0 refills | Status: DC
  Filled 2021-10-27: qty 60, 30d supply, fill #0

## 2021-10-27 MED ORDER — INFLUENZA VAC SPLIT QUAD 0.5 ML IM SUSY: 0.5000 mL | PREFILLED_SYRINGE | INTRAMUSCULAR | Status: DC

## 2021-10-27 MED ORDER — APIXABAN 5 MG PO TABS
5.0000 mg | ORAL_TABLET | Freq: Once | ORAL | Status: AC
  Administered 2021-10-27: 17:00:00 5 mg via ORAL
  Filled 2021-10-27: qty 1

## 2021-10-27 NOTE — TOC Transition Note (Signed)
Transition of Care [facility]) - CM/SW Discharge Note   Patient Details  Name: Elaine Burch MRN: [id number] Date of Birth: 1965-11-24  [facility] CM/SW Contact:  Pollie Friar, RN Phone Number: 10/27/2021, 4:22 PM   Clinical Narrative:    Patient discharging home with self care.  Eliquis sent to [facility] and will be delivered to the room. They will provide with 30 day free coupon. Pt can follow at [facility] after the first 30 days.  Pt has transportation home.    Final next level of care: Home/Self Care Barriers to Discharge: Inadequate or no insurance, Barriers Unresolved (comment)   Patient Goals and CMS Choice        Discharge Placement                       Discharge Plan and Services   Discharge Planning Services: CM Consult                                 Social Determinants of Health (SDOH) Interventions     Readmission Risk Interventions No flowsheet data found.

## 2021-10-27 NOTE — Progress Notes (Addendum)
ANTICOAGULATION CONSULT NOTE - Follow-up Consult  Pharmacy Consult for heparin >> apixaban Indication:  vertebral dissection  No Known Allergies  Patient Measurements: Height: 5' 2" (157.5 cm) Weight: 82 kg (180 lb 12.4 oz) IBW/kg (Calculated) : 50.1 Heparin Dosing Weight: 82 kg   Vital Signs: Temp: 98.6 F (37 C) (01/24 1640) Temp Source: Oral (01/24 1640) BP: 104/62 (01/24 1640) Pulse Rate: 56 (01/24 1640)  Labs: Recent Labs    10/26/21 0238 10/26/21 0252 10/26/21 1236 10/26/21 1945 10/27/21 0500 10/27/21 1056  HGB 15.1* 16.0*  --   --  14.0  --   HCT 44.5 47.0*  --   --  40.7  --   PLT 185  --   --   --  184  --   LABPROT 12.5  --   --   --   --   --   INR 0.9  --   --   --   --   --   HEPARINUNFRC  --   --    < > 1.05* 0.66 0.41  CREATININE 0.52 0.40*  --   --   --   --    < > = values in this interval not displayed.     Estimated Creatinine Clearance: 80.8 mL/min (A) (by C-G formula based on SCr of 0.4 mg/dL (L)).   Medical History: Past Medical History:  Diagnosis Date   Arthritis    beginning   Diabetes mellitus without complication (HCC)    GERD (gastroesophageal reflux disease)    past hx of GERD   Hyperlipidemia     Medications:  Medications Prior to Admission  Medication Sig Dispense Refill Last Dose   acetaminophen (TYLENOL) 500 MG tablet Take 500 mg by mouth every 6 (six) hours as needed for moderate pain or headache.   10/26/2021   atorvastatin (LIPITOR) 20 MG tablet Take 1 tablet (20 mg total) by mouth daily. 30 tablet 0 10/25/2021   Blood Glucose Monitoring Suppl (TRUE METRIX METER) w/Device KIT Use to check blood sugars 2-3 times per day (Patient taking differently: in the morning and at bedtime.) 1 kit 0    glipiZIDE (GLUCOTROL) 5 MG tablet Take 1 tablet (5 mg total) by mouth daily before breakfast. 30 tablet 0 10/25/2021   glucose blood (TRUE METRIX BLOOD GLUCOSE TEST) test strip USE AS INSTRUCTED TO CHECK BLOOD SUGAR 2-3 TIMES (Patient  taking differently: in the morning and at bedtime.) 100 strip 9    meloxicam (MOBIC) 15 MG tablet Take 1 tablet (15 mg total) by mouth daily as needed for pain. 30 tablet 6 unk   metFORMIN (GLUCOPHAGE) 500 MG tablet Take 2 tablets (1,000 mg total) by mouth 2 (two) times daily with a meal. 120 tablet 0 10/25/2021   tiZANidine (ZANAFLEX) 2 MG tablet Take 1 tablet (2 mg total) by mouth 3 (three) times daily as needed for muscle spasms. 60 tablet 0 10/24/2021   TRUEplus Lancets 28G MISC USE TO CHECK BLOOD SUGAR 2-3 TIMES DAILY. (Patient taking differently: in the morning and at bedtime.) 100 each 11     Assessment: 70 YOF who presented with neck pain and leg weakness with concern for vertebral dissection on CT. Pharmacy consulted to start IV heparin. Now consulted to transition to apixaban with plan to discharge patient home today. CBC and SCr WNL. No bleed issues reported.   Goal of Therapy:  Heparin level 0.3-0.7 units/ml Monitor platelets by anticoagulation protocol: Yes   Plan:  Per discussion with  RN, he has already stopped heparin drip. Instructed him to go ahead and give 1st apixaban dose now Start apixaban 15m PO BID Monitor CBC, s/sx bleeding TOC Pharmacy delivered apixaban Rx to bedside for discharge later today per MD request   HArturo Morton PharmD, BCPS Please check AMION for all MWoodruffcontact numbers Clinical Pharmacist 10/27/2021 4:51 PM

## 2021-10-27 NOTE — Discharge Summary (Addendum)
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 female with medical history significant of DM and HLD presenting with acute neck pain and dizziness that occurred during intercourse this AM.   She reports that she was having intercourse with her husband this AM when she developed acute onset of left temporal and forehead headache followed by emesis x 2.  She then developed pain in her left neck which radiated to the R neck.  She had dizziness with standing and attempting to walk.  No other N/W/T of arms/legs.  History obtained via Development worker, community.  Hospital Course:  Left vertebral artery dissection - This morning the patient told me her dizziness is improving as is her neck pain - Appreciate vascular surgery eval-DOAC as recommended-Dr. Doren Custard is arranging a CTA of her neck in 6 months along with follow-up - pharmacy recommending 5 mg BID dose - Have requested TOC assistance and will receive a 30-day free card for Eliquis-further Eliquis assistance being pursued by TOC-the patient also states that her primary care practice has been assisting with cost of meds  Discharge Exam: Vitals:   10/27/21 0800 10/27/21 1059  BP: 105/67 (!) 84/48  Pulse: 62 (!) 56  Resp: 20 19  Temp:  98.8 F (37.1 C)  SpO2: 95% 95%   Vitals:   10/27/21 0400 10/27/21 0630 10/27/21 0800 10/27/21 1059  BP: 95/65 103/68 105/67 (!) 84/48  Pulse: (!) 57 (!) 57 62 (!) 56  Resp: '14 14 20 19  ' Temp:    98.8 F (37.1 C)  TempSrc:    Oral  SpO2: 95% 95% 95%  95%  Weight:      Height:        General: Pt is alert, awake, not in acute distress Cardiovascular: RRR, S1/S2 +, no rubs, no gallops Respiratory: CTA bilaterally, no wheezing, no rhonchi Abdominal: Soft, NT, ND, bowel sounds + Extremities: no edema, no cyanosis   Discharge Instructions   Allergies as of 10/27/2021   No Known Allergies      Medication List     STOP taking these medications    meloxicam 15 MG tablet Commonly known as: MOBIC       TAKE these medications    acetaminophen 500 MG tablet Commonly known as: TYLENOL Take 500 mg by mouth every 6 (six) hours as needed for moderate pain or headache.   apixaban 5 MG Tabs tablet Commonly known as: ELIQUIS Take 1 tablet (5 mg total) by mouth 2 (two) times daily.   atorvastatin 20 MG tablet Commonly known as: LIPITOR Tome 1 tableta (20 mg en total) por va oral diariamente. (Take 1 tablet (20 mg total) by mouth daily.)   glipiZIDE 5 MG tablet Commonly known as: GLUCOTROL Tome 1 tableta (5 mg en total) por va oral diariamente antes de desayunar. (Take 1 tablet (5 mg total) by mouth daily before breakfast.)   metFORMIN 500 MG tablet Commonly known as: GLUCOPHAGE Take 2 tablets (1,000 mg total) by mouth 2 (two) times daily with a meal.   tiZANidine 2 MG tablet Commonly known as: ZANAFLEX Take 1  tablet (2 mg total) by mouth 3 (three) times daily as needed for muscle spasms.   True Metrix Blood Glucose Test test strip Generic drug: glucose blood USE AS INSTRUCTED TO CHECK BLOOD SUGAR 2-3 TIMES What changed:  when to take this additional instructions   True Metrix Meter w/Device Kit Use to check blood sugars 2-3 times per day What changed:  when to take this additional instructions   TRUEplus Lancets 28G Misc USE TO CHECK BLOOD SUGAR 2-3 TIMES DAILY. What changed: when to take this         No Known Allergies    VAS US CAROTID  Result Date: 10/27/2021 Carotid Arterial Duplex Study  Patient Name:  Elaine Burch  Date of Exam:   10/26/2021 Medical Rec #: [id number]               Accession #:    [id number] Date of Birth: [birth year]               Patient Gender: F Patient Age:   56 years Exam Location:  [facility] Procedure:      VAS US CAROTID Referring Phys: Harrell Gave DICKSON --------------------------------------------------------------------------------  Indications:       Correlate to CTA neck finding possible vertebral artery                    dissection. Risk Factors:      Hyperlipidemia, Diabetes. Comparison Study:  no prior Performing Technologist: Archie Patten RVS  Examination Guidelines: A complete evaluation includes B-mode imaging, spectral Doppler, color Doppler, and power Doppler as needed of all accessible portions of each vessel. Bilateral testing is considered an integral part of a complete examination. Limited examinations for reoccurring indications may be performed as noted.  Right Carotid Findings: +----------+--------+--------+--------+------------------+--------+             PSV cm/s EDV cm/s Stenosis Plaque Description Comments  +----------+--------+--------+--------+------------------+--------+  CCA Prox   93       15                heterogenous                 +----------+--------+--------+--------+------------------+--------+  CCA Distal 85       21                heterogenous                 +----------+--------+--------+--------+------------------+--------+  ICA Prox   72       21       1-39%    heterogenous                 +----------+--------+--------+--------+------------------+--------+  ICA Distal 89       30                                             +----------+--------+--------+--------+------------------+--------+  ECA        141      16                                             +----------+--------+--------+--------+------------------+--------+ +----------+--------+-------+--------+-------------------+             PSV cm/s EDV  cms Describe Arm Pressure (mmHG)  +----------+--------+-------+--------+-------------------+  Subclavian 117                                            +----------+--------+-------+--------+-------------------+ +---------+--------+--+--------+--+---------+  Vertebral PSV cm/s 34 EDV cm/s 12 Antegrade  +---------+--------+--+--------+--+---------+  Left Carotid Findings: +----------+--------+--------+--------+------------------+--------+             PSV cm/s EDV cm/s Stenosis Plaque Description Comments  +----------+--------+--------+--------+------------------+--------+  CCA Prox   103      24                heterogenous                 +----------+--------+--------+--------+------------------+--------+  CCA Distal 80       23                heterogenous                 +----------+--------+--------+--------+------------------+--------+  ICA Prox   72       27       1-39%    heterogenous                 +----------+--------+--------+--------+------------------+--------+  ICA Distal 62       28                                             +----------+--------+--------+--------+------------------+--------+  ECA        112      17                                             +----------+--------+--------+--------+------------------+--------+ +----------+--------+--------+--------+-------------------+             PSV cm/s EDV cm/s Describe Arm Pressure (mmHG)  +----------+--------+--------+--------+-------------------+  Subclavian 182                                             +----------+--------+--------+--------+-------------------+ +---------+--------+--+--------+--+---------+  Vertebral PSV cm/s 49 EDV cm/s 18 Antegrade  +---------+--------+--+--------+--+---------+   Summary: Right Carotid: Velocities in the right ICA are consistent with a 1-39% stenosis. Left Carotid: Velocities in the left ICA are consistent with a 1-39% stenosis. Vertebrals: Bilateral vertebral arteries demonstrate antegrade flow. *See table(s)  above for measurements and observations.  Electronically signed by Antony Contras MD on 10/27/2021 at 1:02:48 PM.    Final    CT ANGIO HEAD NECK W WO CM (CODE STROKE)  Result Date: 10/26/2021 CLINICAL DATA:  Initial evaluation for acute headache. EXAM: CT ANGIOGRAPHY HEAD AND NECK TECHNIQUE: Multidetector CT imaging of the head and neck was performed using the standard protocol during bolus administration of intravenous contrast. Multiplanar CT image reconstructions and MIPs were obtained to evaluate the vascular anatomy. Carotid stenosis measurements (when applicable) are obtained utilizing NASCET criteria, using the distal internal carotid diameter as the denominator. RADIATION DOSE REDUCTION: This exam was performed according to the departmental dose-optimization program which includes automated exposure control, adjustment of the mA and/or kV according to patient size and/or use of iterative reconstruction technique. CONTRAST:  31m OMNIPAQUE IOHEXOL 350 MG/ML SOLN  COMPARISON:  Prior study from 10/07/2019 FINDINGS: CT HEAD FINDINGS Brain: Cerebral volume within normal limits for patient age. No evidence for acute intracranial hemorrhage. No findings to suggest acute large vessel territory infarct. No mass lesion, midline shift, or mass effect. Ventricles are normal in size without evidence for hydrocephalus. No extra-axial fluid collection identified. Vascular: No hyperdense vessel identified. Skull: Scalp soft tissues demonstrate no acute abnormality. Calvarium intact. Sinuses/Orbits: Globes and orbital soft tissues within normal limits. Visualized paranasal sinuses are clear. No mastoid effusion. CTA NECK FINDINGS Aortic arch: Visualized aortic arch normal caliber with normal branch pattern. No stenosis about the origin of the great vessels. Right carotid system: Right common and internal carotid arteries widely patent without stenosis, dissection or occlusion. Left carotid system: Left common and internal  carotid arteries widely patent without stenosis, dissection or occlusion. Vertebral arteries: Both vertebral arteries arise from the subclavian arteries. No proximal subclavian artery stenosis. Right vertebral artery widely patent without abnormality. There is focal intimal irregularity with mural based hypodensity and mild narrowing of the distal left V3 segment, new from prior, and suspicious for possible dissection (series 11, image 169). No visible raised flap or frank filling defect. Skeleton: No discrete or worrisome osseous lesions. Other neck: No other acute soft tissue abnormality within the neck. Upper chest: Visualized upper chest demonstrates no acute finding. Subcentimeter calcified granuloma noted within the left lung. Small bleb noted at the left upper lobe as well. Review of the MIP images confirms the above findings CTA HEAD FINDINGS Anterior circulation: Both internal carotid arteries widely patent to the termini without stenosis. A1 segments widely patent. Normal anterior communicating artery complex. Both anterior cerebral arteries widely patent to their distal aspects without stenosis. No M1 stenosis or occlusion. Normal MCA bifurcations. Distal MCA branches well perfused and symmetric. Posterior circulation: Both V4 segments patent to the vertebrobasilar junction without stenosis. Both PICA origins patent and normal. Basilar widely patent to its distal aspect without stenosis. Superior cerebellar arteries patent bilaterally. Both PCAs primarily supplied via the basilar and are well perfused to there distal aspects. Venous sinuses: Patent allowing for timing the contrast bolus. Anatomic variants: None significant.  No aneurysm. Review of the MIP images confirms the above findings IMPRESSION: 1. Focal intimal irregularity with mural based hypodensity and mild narrowing of the distal left V3 segment, new from prior, and suspicious for acute dissection. No visible raised flap or frank filling  defect. 2. Otherwise stable and negative CTA of the head and neck. No visible downstream embolic occlusion. 3. No other acute intracranial abnormality. Electronically Signed   By: Jeannine Boga M.D.   On: 10/26/2021 03:33     The results of significant diagnostics from this hospitalization (including imaging, microbiology, ancillary and laboratory) are listed below for reference.     Microbiology: Recent Results (from the past 240 hour(s))  Resp Panel by RT-PCR (Flu A&B, Covid) Nasopharyngeal Swab     Status: None   Collection Time: 10/26/21  4:00 AM   Specimen: Nasopharyngeal Swab; Nasopharyngeal(NP) swabs in vial transport medium  Result Value Ref Range Status   SARS Coronavirus 2 by RT PCR NEGATIVE NEGATIVE Final    Comment: (NOTE) SARS-CoV-2 target nucleic acids are NOT DETECTED.  The SARS-CoV-2 RNA is generally detectable in upper respiratory specimens during the acute phase of infection. The lowest concentration of SARS-CoV-2 viral copies this assay can detect is 138 copies/mL. A negative result does not preclude SARS-Cov-2 infection and should not be used as the sole basis  for treatment or other patient management decisions. A negative result may occur with  improper specimen collection/handling, submission of specimen other than nasopharyngeal swab, presence of viral mutation(s) within the areas targeted by this assay, and inadequate number of viral copies(<138 copies/mL). A negative result must be combined with clinical observations, patient history, and epidemiological information. The expected result is Negative.  Fact Sheet for Patients:  EntrepreneurPulse.com.au  Fact Sheet for Healthcare Providers:  IncredibleEmployment.be  This test is no t yet approved or cleared by the [place] FDA and  has been authorized for detection and/or diagnosis of SARS-CoV-2 by FDA under an Emergency Use Authorization (EUA). This EUA will  remain  in effect (meaning this test can be used) for the duration of the COVID-19 declaration under Section 564(b)(1) of the Act, 21 U.S.C.section 360bbb-3(b)(1), unless the authorization is terminated  or revoked sooner.       Influenza A by PCR NEGATIVE NEGATIVE Final   Influenza B by PCR NEGATIVE NEGATIVE Final    Comment: (NOTE) The Xpert Xpress SARS-CoV-2/FLU/RSV plus assay is intended as an aid in the diagnosis of influenza from Nasopharyngeal swab specimens and should not be used as a sole basis for treatment. Nasal washings and aspirates are unacceptable for Xpert Xpress SARS-CoV-2/FLU/RSV testing.  Fact Sheet for Patients: EntrepreneurPulse.com.au  Fact Sheet for Healthcare Providers: IncredibleEmployment.be  This test is not yet approved or cleared by the [place] FDA and has been authorized for detection and/or diagnosis of SARS-CoV-2 by FDA under an Emergency Use Authorization (EUA). This EUA will remain in effect (meaning this test can be used) for the duration of the COVID-19 declaration under Section 564(b)(1) of the Act, 21 U.S.C. section 360bbb-3(b)(1), unless the authorization is terminated or revoked.  Performed at [facility], [place] [address], [place], [place] [address]      Labs: BNP (last 3 results) No results for input(s): BNP in the last 8760 hours. Basic Metabolic Panel: Recent Labs  Lab 10/26/21 0238 10/26/21 0252  NA 137 140  K 3.5 3.5  CL 104 102  CO2 23  --   GLUCOSE 208* 204*  BUN 14 13  CREATININE 0.52 0.40*  CALCIUM 9.8  --    Liver Function Tests: No results for input(s): AST, ALT, ALKPHOS, BILITOT, PROT, ALBUMIN in the last 168 hours. No results for input(s): LIPASE, AMYLASE in the last 168 hours. No results for input(s): AMMONIA in the last 168 hours. CBC: Recent Labs  Lab 10/26/21 0238 10/26/21 0252 10/27/21 0500  WBC 11.4*  --  6.0  NEUTROABS 8.7*  --   --   HGB 15.1*  16.0* 14.0  HCT 44.5 47.0* 40.7  MCV 93.1  --  92.5  PLT 185  --  184   Cardiac Enzymes: No results for input(s): CKTOTAL, CKMB, CKMBINDEX, TROPONINI in the last 168 hours. BNP: Invalid input(s): POCBNP CBG: Recent Labs  Lab 10/26/21 1319 10/26/21 1806 10/26/21 2121 10/27/21 0755 10/27/21 1104  GLUCAP 109* 122* 140* 131* 170*   D-Dimer No results for input(s): DDIMER in the last 72 hours. Hgb A1c No results for input(s): HGBA1C in the last 72 hours. Lipid Profile Recent Labs    10/27/21 0500  CHOL 147  HDL 63  LDLCALC 56  TRIG 141  CHOLHDL 2.3   Thyroid function studies No results for input(s): TSH, T4TOTAL, T3FREE, THYROIDAB in the last 72 hours.  Invalid input(s): FREET3 Anemia work up No results for input(s): VITAMINB12, FOLATE, FERRITIN, TIBC, IRON, RETICCTPCT  in the last 72 hours. Urinalysis    Component Value Date/Time   LABSPEC >=1.030 06/19/2020 1024   PHURINE 5.5 06/19/2020 1024   GLUCOSEU NEGATIVE 06/19/2020 1024   HGBUR TRACE (A) 06/19/2020 1024   BILIRUBINUR NEGATIVE 06/19/2020 1024   BILIRUBINUR negative 08/09/2019 1647   KETONESUR NEGATIVE 06/19/2020 1024   PROTEINUR NEGATIVE 06/19/2020 1024   UROBILINOGEN 0.2 06/19/2020 1024   NITRITE NEGATIVE 06/19/2020 1024   LEUKOCYTESUR NEGATIVE 06/19/2020 1024   Sepsis Labs Invalid input(s): PROCALCITONIN,  WBC,  LACTICIDVEN Microbiology Recent Results (from the past 240 hour(s))  Resp Panel by RT-PCR (Flu A&B, Covid) Nasopharyngeal Swab     Status: None   Collection Time: 10/26/21  4:00 AM   Specimen: Nasopharyngeal Swab; Nasopharyngeal(NP) swabs in vial transport medium  Result Value Ref Range Status   SARS Coronavirus 2 by RT PCR NEGATIVE NEGATIVE Final    Comment: (NOTE) SARS-CoV-2 target nucleic acids are NOT DETECTED.  The SARS-CoV-2 RNA is generally detectable in upper respiratory specimens during the acute phase of infection. The lowest concentration of SARS-CoV-2 viral copies this assay  can detect is 138 copies/mL. A negative result does not preclude SARS-Cov-2 infection and should not be used as the sole basis for treatment or other patient management decisions. A negative result may occur with  improper specimen collection/handling, submission of specimen other than nasopharyngeal swab, presence of viral mutation(s) within the areas targeted by this assay, and inadequate number of viral copies(<138 copies/mL). A negative result must be combined with clinical observations, patient history, and epidemiological information. The expected result is Negative.  Fact Sheet for Patients:  EntrepreneurPulse.com.au  Fact Sheet for Healthcare Providers:  IncredibleEmployment.be  This test is no t yet approved or cleared by the [place] FDA and  has been authorized for detection and/or diagnosis of SARS-CoV-2 by FDA under an Emergency Use Authorization (EUA). This EUA will remain  in effect (meaning this test can be used) for the duration of the COVID-19 declaration under Section 564(b)(1) of the Act, 21 U.S.C.section 360bbb-3(b)(1), unless the authorization is terminated  or revoked sooner.       Influenza A by PCR NEGATIVE NEGATIVE Final   Influenza B by PCR NEGATIVE NEGATIVE Final    Comment: (NOTE) The Xpert Xpress SARS-CoV-2/FLU/RSV plus assay is intended as an aid in the diagnosis of influenza from Nasopharyngeal swab specimens and should not be used as a sole basis for treatment. Nasal washings and aspirates are unacceptable for Xpert Xpress SARS-CoV-2/FLU/RSV testing.  Fact Sheet for Patients: EntrepreneurPulse.com.au  Fact Sheet for Healthcare Providers: IncredibleEmployment.be  This test is not yet approved or cleared by the [place] FDA and has been authorized for detection and/or diagnosis of SARS-CoV-2 by FDA under an Emergency Use Authorization (EUA). This EUA will  remain in effect (meaning this test can be used) for the duration of the COVID-19 declaration under Section 564(b)(1) of the Act, 21 U.S.C. section 360bbb-3(b)(1), unless the authorization is terminated or revoked.  Performed at [facility], [place] [address] [place], [place] [address]        SIGNED:   Debbe Odea, MD  [facility] 10/27/2021, 4:37 PM

## 2021-10-27 NOTE — Discharge Instructions (Addendum)
Meloxicam can cause easy bleeding. Do not take it while taking Eliquis.   You were cared for by a hospitalist during your hospital stay. If you have any questions about your discharge medications or the care you received while you were in the hospital after you are discharged, you can call the unit and asked to speak with the hospitalist on call if the hospitalist that took care of you is not available. Once you are discharged, your primary care physician will handle any further medical issues.   Please note that NO REFILLS for any discharge medications will be authorized once you are discharged, as it is imperative that you return to your primary care physician (or establish a relationship with a primary care physician if you do not have one) for your aftercare needs so that they can reassess your need for medications and monitor your lab values.  Please take all your medications with you for your next visit with your Primary MD. Please ask your Primary MD to get all Hospital records sent to his/her office. Please request your Primary MD to go over all hospital test results at the follow up.   If you experience worsening of your admission symptoms, develop shortness of breath, chest pain, suicidal or homicidal thoughts or a life threatening emergency, you must seek medical attention immediately by calling 911 or calling your MD.   Dennis Bast must read the complete instructions/literature along with all the possible adverse reactions/side effects for all the medicines you take including new medications that have been prescribed to you. Take new medicines after you have completely understood and accpet all the possible adverse reactions/side effects.    Do not drive when taking pain medications or sedatives.     Do not take more than prescribed Pain, Sleep and Anxiety Medications   If you have smoked or chewed Tobacco in the last 2 yrs please stop. Stop any regular alcohol  and or recreational drug use.    Wear Seat belts while driving.    _________________________________  Information on my medicine - ELIQUIS (apixaban)  This medication education was reviewed with me or my healthcare representative as part of my discharge preparation.    Why was Eliquis prescribed for you? Eliquis was prescribed for you to reduce the risk of a blood clot forming that can cause a stroke.  What do You need to know about Eliquis ? Take your Eliquis TWICE DAILY - one tablet in the morning and one tablet in the evening with or without food. If you have difficulty swallowing the tablet whole please discuss with your pharmacist how to take the medication safely.  Take Eliquis exactly as prescribed by your doctor and DO NOT stop taking Eliquis without talking to the doctor who prescribed the medication.  Stopping may increase your risk of developing a stroke.  Refill your prescription before you run out.  After discharge, you should have regular check-up appointments with your healthcare provider that is prescribing your Eliquis.  In the future your dose may need to be changed if your kidney function or weight changes by a significant amount or as you get older.  What do you do if you miss a dose? If you miss a dose, take it as soon as you remember on the same day and resume taking twice daily.  Do not take more than one dose of ELIQUIS at the same time to make up a missed dose.  Important Safety Information A possible side effect of Eliquis is  bleeding. You should call your healthcare provider right away if you experience any of the following: Bleeding from an injury or your nose that does not stop. Unusual colored urine (red or dark brown) or unusual colored stools (red or black). Unusual bruising for unknown reasons. A serious fall or if you hit your head (even if there is no bleeding).  Some medicines may interact with Eliquis and might increase your risk of bleeding or clotting while on Eliquis.  To help avoid this, consult your healthcare provider or pharmacist prior to using any new prescription or non-prescription medications, including herbals, vitamins, non-steroidal anti-inflammatory drugs (NSAIDs) and supplements.  This website has more information on Eliquis (apixaban): http://www.eliquis.com/eliquis/home

## 2021-10-27 NOTE — ED Notes (Signed)
Breakfast orders placed 

## 2021-10-27 NOTE — TOC Initial Note (Signed)
Transition of Care [facility]) - Initial/Assessment Note    Patient Details  Name: Elaine Burch MRN: [id number] Date of Birth: 28-Aug-1966  Transition of Care [facility]) CM/SW Contact:    Pollie Friar, RN Phone Number: 10/27/2021, 2:40 PM  Clinical Narrative:                 Patient is from home with spouse and children. She is alone during the week when spouse is at work and children are in school.  No DME.  Pt goes to [facility] for her PCP. She uses [facility] for her pharmacy. She states they have been assisting with the cost of her meds.  Pt denies issues with transportation. CM consulted for [employer] assistance. CM has gotten permission to submit pt information to see if patient assistance can be obtained through company. CM will also provide 30 day free card at discharge.  TOC following.  Expected Discharge Plan: Home/Self Care Barriers to Discharge: Continued Medical Work up   Patient Goals and CMS Choice        Expected Discharge Plan and Services Expected Discharge Plan: Home/Self Care   Discharge Planning Services: CM Consult   Living arrangements for the past 2 months: Single Family Home                                      Prior Living Arrangements/Services Living arrangements for the past 2 months: Single Family Home Lives with:: Minor Children, Spouse Patient language and need for interpreter reviewed:: Yes (Spanish interpreter used) Do you feel safe going back to the place where you live?: Yes            Criminal Activity/Legal Involvement Pertinent to Current Situation/Hospitalization: No - Comment as needed  Activities of Daily Living Home Assistive Devices/Equipment: None ADL Screening (condition at time of admission) Patient's cognitive ability adequate to safely complete daily activities?: Yes Is the patient deaf or have difficulty hearing?: No Does the patient have difficulty seeing, even when wearing glasses/contacts?:  No Does the patient have difficulty concentrating, remembering, or making decisions?: No Patient able to express need for assistance with ADLs?: No Does the patient have difficulty dressing or bathing?: No Independently performs ADLs?: Yes (appropriate for developmental age) Does the patient have difficulty walking or climbing stairs?: No Weakness of Legs: None Weakness of Arms/Hands: None  Permission Sought/Granted                  Emotional Assessment Appearance:: Appears stated age Attitude/Demeanor/Rapport: Engaged Affect (typically observed): Accepting Orientation: : Oriented to Self, Oriented to Place, Oriented to  Time, Oriented to Situation   Psych Involvement: No (comment)  Admission diagnosis:  Vertebral artery dissection [facility]) [I77.74] Patient Active Problem List   Diagnosis Date Noted   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 [address] [place] [address] Phone: [phone] Fax: [place]), [place] [address] [place] ([place]) [place] [address] Phone: [phone] Fax: [phone]  Social Determinants of Health (SDOH) Interventions    Readmission Risk Interventions No flowsheet data found.

## 2021-10-27 NOTE — Progress Notes (Signed)
ANTICOAGULATION CONSULT NOTE - Follow-up Consult  Pharmacy Consult for heparin Indication:  vertebral dissection  No Known Allergies  Patient Measurements: Height: _0  (157.5 cm) Weight: 82 kg (180 lb 12.4 oz) IBW/kg (Calculated) : 50.1 Heparin Dosing Weight: 82 kg   Vital Signs: Temp: 98.8 F (37.1 C) (01/24 1059) Temp Source: Oral (01/24 1059) BP: 84/48 (01/24 1059) Pulse Rate: 56 (01/24 1059)  Labs: Recent Labs    10/26/21 0238 10/26/21 0252 10/26/21 1236 10/26/21 1945 10/27/21 0500 10/27/21 1056  HGB 15.1* 16.0*  --   --  14.0  --   HCT 44.5 47.0*  --   --  40.7  --   PLT 185  --   --   --  184  --   LABPROT 12.5  --   --   --   --   --   INR 0.9  --   --   --   --   --   HEPARINUNFRC  --   --    < > 1.05* 0.66 0.41  CREATININE 0.52 0.40*  --   --   --   --    < > = values in this interval not displayed.     Estimated Creatinine Clearance: 80.8 mL/min (A) (by C-G formula based on SCr of 0.4 mg/dL (L)).   Medical History: Past Medical History:  Diagnosis Date   Arthritis    beginning   Diabetes mellitus without complication (HCC)    GERD (gastroesophageal reflux disease)    past hx of GERD   Hyperlipidemia     Medications:  Medications Prior to Admission  Medication Sig Dispense Refill Last Dose   acetaminophen (TYLENOL) 500 MG tablet Take 500 mg by mouth every 6 (six) hours as needed for moderate pain or headache.   10/26/2021   atorvastatin (LIPITOR) 20 MG tablet Take 1 tablet (20 mg total) by mouth daily. 30 tablet 0 10/25/2021   Blood Glucose Monitoring Suppl (TRUE METRIX METER) w/Device KIT Use to check blood sugars 2-3 times per day (Patient taking differently: in the morning and at bedtime.) 1 kit 0    glipiZIDE (GLUCOTROL) 5 MG tablet Take 1 tablet (5 mg total) by mouth daily before breakfast. 30 tablet 0 10/25/2021   glucose blood (TRUE METRIX BLOOD GLUCOSE TEST) test strip USE AS INSTRUCTED TO CHECK BLOOD SUGAR 2-3 TIMES (Patient taking  differently: in the morning and at bedtime.) 100 strip 9    meloxicam (MOBIC) 15 MG tablet Take 1 tablet (15 mg total) by mouth daily as needed for pain. 30 tablet 6 unk   metFORMIN (GLUCOPHAGE) 500 MG tablet Take 2 tablets (1,000 mg total) by mouth 2 (two) times daily with a meal. 120 tablet 0 10/25/2021   tiZANidine (ZANAFLEX) 2 MG tablet Take 1 tablet (2 mg total) by mouth 3 (three) times daily as needed for muscle spasms. 60 tablet 0 10/24/2021   TRUEplus Lancets 28G MISC USE TO CHECK BLOOD SUGAR 2-3 TIMES DAILY. (Patient taking differently: in the morning and at bedtime.) 100 each 11     Assessment: 68 YOF who presented with neck pain and leg weakness with concern for vertebral dissection on CT. Pharmacy consulted to start IV heparin.   Heparin level came back therapeutic again today. We will continue with the current rate. Plan for NOAC at discharge  Goal of Therapy:  Heparin level 0.3-0.7 units/ml Monitor platelets by anticoagulation protocol: Yes   Plan:  Continue heparin gtt at 1100 units/hr  Daily heparin level, CBC Monitor for signs and symptoms of bleeding Follow-up vascular recommendations for oral anticoagulation  Onnie Boer, PharmD, BCIDP, AAHIVP, CPP Infectious Disease Pharmacist 10/27/2021 1:09 PM

## 2021-10-27 NOTE — Plan of Care (Signed)
Found this patient in the room from ED, transporter was waiting for the nurse, patient is A&Ox4 spanish speaking, low BP and HR noticed in her VS, on Heparin Drip at 1100 U/h, will continue to monitor.

## 2021-10-27 NOTE — Progress Notes (Addendum)
Progress Note  VASCULAR SURGERY ASSESSMENT & PLAN:   LEFT VERTEBRAL ARTERY DISSECTION: Based on her history and CT findings I suspect she had a left vertebral artery dissection.  This is being treated with anticoagulation.  The translator was present this afternoon and the patient tells me that her symptoms have for the most part resolved.  Given that her symptoms have essentially resolved I think at this point she can be converted to a DOAC.  I have arranged a follow-up CT angio of the neck in 6 months and I will see her back at that time.  If that looks good we could consider stopping her DOAC at that time.  From our standpoint she can be discharged whenever her symptoms have completely resolved.  I have arranged follow-up with [place].  Vascular surgery will be available as needed.  Gae Gallop, MD 3:29 PM   10/27/2021 9:55 AM Hospital Day 1  Subjective:  interpreter machine not working-daughter translated.  Pt feeling better but still with headache that comes in waves.  Overall feeling better. Wants to know what her restrictions are for going back to work.  54'O-270'J systolic   Vitals:   50/09/38 0630 10/27/21 0800  BP: 103/68 105/67  Pulse: (!) 57 62  Resp: 14 20  Temp:    SpO2: 95% 95%    Physical Exam: General:  no distress Lungs:  non labored Extremities:  moving all extremities equally  CBC    Component Value Date/Time   WBC 6.0 10/27/2021 0500   RBC 4.40 10/27/2021 0500   HGB 14.0 10/27/2021 0500   HGB 13.9 01/15/2021 1126   HCT 40.7 10/27/2021 0500   HCT 41.1 01/15/2021 1126   PLT 184 10/27/2021 0500   PLT 187 01/15/2021 1126   MCV 92.5 10/27/2021 0500   MCV 92 01/15/2021 1126   MCH 31.8 10/27/2021 0500   MCHC 34.4 10/27/2021 0500   RDW 13.1 10/27/2021 0500   RDW 13.1 01/15/2021 1126   LYMPHSABS 2.2 10/26/2021 0238   LYMPHSABS 2.0 01/15/2021 1126   MONOABS 0.4 10/26/2021 0238   EOSABS 0.1 10/26/2021 0238   EOSABS 0.1 01/15/2021 1126   BASOSABS 0.1  10/26/2021 0238   BASOSABS 0.0 01/15/2021 1126    BMET    Component Value Date/Time   NA 140 10/26/2021 0252   NA 141 01/15/2021 1126   K 3.5 10/26/2021 0252   CL 102 10/26/2021 0252   CO2 23 10/26/2021 0238   GLUCOSE 204 (H) 10/26/2021 0252   BUN 13 10/26/2021 0252   BUN 10 01/15/2021 1126   CREATININE 0.40 (L) 10/26/2021 0252   CREATININE 0.43 (L) 11/26/2013 0903   CALCIUM 9.8 10/26/2021 0238   GFRNONAA >60 10/26/2021 0238   GFRNONAA >89 11/26/2013 0903   GFRAA 125 03/14/2020 1459   GFRAA >89 11/26/2013 0903    INR    Component Value Date/Time   INR 0.9 10/26/2021 0238     Intake/Output Summary (Last 24 hours) at 10/27/2021 0955 Last data filed at 10/27/2021 0736 Gross per 24 hour  Intake 110.94 ml  Output --  Net 110.94 ml     Assessment/Plan:  56 y.o. female with left verterbral artery dissection  Hospital Day 1  -pt symptomatically better.  Carotid duplex this morning reveals 1-39% bilateral ICA stenosis with vertebral arteries demonstrating antegrade flow.  -blood pressure well controlled.   -will need DOAC at discharge -Dr. Scot Dock will see pt in 6 months with CTA of the neck.   -would hold  on returning to work for 3 weeks per Dr. Scot Dock.  Leontine Locket, PA-C [facility] [phone] 10/27/2021 9:55 AM

## 2021-10-27 NOTE — ED Notes (Signed)
Up to chair to eat breakfast. Gait steady. No signs of distress.

## 2021-10-27 NOTE — Evaluation (Signed)
Occupational Therapy Evaluation Patient Details Name: Elaine Burch MRN: [id number] DOB: [birth year] Today's Date: 10/27/2021   History of Present Illness Pt is 56 yo female who presents with acute neck pain and dizziness with L HA and emesis x2. Pt with L vertebral artery dissection. PMH: arthritis, L shoulder issues, HTN, HLD, GERD, DM.   Clinical Impression   Elaine Burch reports being indep in all ADL/IADLs and mobility PTA, she works in the evenings cleaning office spaces and take care of her household and children during the day. Upon evaluation pt demonstrated indep ability to complete functional mobility and ADLs. Her R hand is mildly weaker than her L, 4+/5 MMT with normal ROM and sensation. Her proprioception is intact, and her coordination is slightly slower than L, however still WFL.Her R hand had mild edema, anticipate once swelling subsides her strength with return to normal. Reviewed exercises and positioning. Pt does not have further acute OT needs. Recommend d/c to home without follow up OT.      Recommendations for follow up therapy are one component of a multi-disciplinary discharge planning process, led by the attending physician.  Recommendations may be updated based on patient status, additional functional criteria and insurance authorization.   Follow Up Recommendations  No OT follow up    Assistance Recommended at Discharge PRN     Functional Status Assessment  Patient has had a recent decline in their functional status and demonstrates the ability to make significant improvements in function in a reasonable and predictable amount of time.  Equipment Recommendations  None recommended by OT    Recommendations for Other Services       Precautions / Restrictions Precautions Precautions: None Restrictions Weight Bearing Restrictions: No      Mobility Bed Mobility Overal bed mobility: Independent        Transfers Overall transfer level:  Independent Equipment used: None          Balance Overall balance assessment: Modified Independent         ADL either performed or assessed with clinical judgement   ADL Overall ADL's : Independent;At baseline           General ADL Comments: pt demonstrated indep ADLs, she navigated the room and bathroom safely (& pushed her own IV pole). Used RUE funcitonally. Pt reports concern with her ability to return to work and be thorough due to new R weakness.     Vision Baseline Vision/History: 0 No visual deficits Patient Visual Report: No change from baseline Vision Assessment?: No apparent visual deficits            Pertinent Vitals/Pain Pain Assessment Pain Assessment: No/denies pain     Hand Dominance Right   Extremity/Trunk Assessment Upper Extremity Assessment Upper Extremity Assessment: Overall WFL for tasks assessed;RUE deficits/detail RUE Deficits / Details: Increased edema in R hand. Grip overall 4+/5. Wrist flexion and extension 5/5 MMT. Thumb to finger slow/deliberate when compared to L however still WFL. finger to nose and palm flipping is WFL. Proprioception in tact, sensation is normal. RUE Sensation: WNL RUE Coordination: WNL;decreased gross motor   Lower Extremity Assessment Lower Extremity Assessment: Defer to PT evaluation RLE Deficits / Details: strength 5/5 thoughout, R=L RLE Sensation: WNL RLE Coordination: WNL   Cervical / Trunk Assessment Cervical / Trunk Assessment: Normal   Communication Communication Communication: Prefers language other than English;No difficulties   Cognition Arousal/Alertness: Awake/alert Behavior During Therapy: WFL for tasks assessed/performed Overall Cognitive Status: Within Functional Limits for tasks assessed  General Comments  VSS on RA - spanish video interpreter utilized            [place] expects to be discharged to:: Private residence Living Arrangements:  Spouse/significant other Available Help at Discharge: Family;Available PRN/intermittently Type of Home: House Home Access: Stairs to enter Entrance Stairs-Number of Steps: 4   Home Layout: One level               Home Equipment: None   Additional Comments: pt works nights cleaning offices. Takes care of home and kids in the day. Husband works at a nursery (plants)      Prior Functioning/Environment Prior Level of Function : Independent/Modified Independent;Working/employed             Mobility Comments: does not drive at baseline. Independent with mobility ADLs Comments: independent        OT Problem List: Impaired UE functional use      OT Treatment/Interventions:      OT Goals(Current goals can be found in the care plan section) Acute Rehab OT Goals Patient Stated Goal: back to work asap OT Goal Formulation: All assessment and education complete, DC therapy Time For Goal Achievement: 10/27/21  OT Frequency:         AM-PAC OT "6 Clicks" Daily Activity     Outcome Measure Help from another person eating meals?: None Help from another person taking care of personal grooming?: None Help from another person toileting, which includes using toliet, bedpan, or urinal?: None Help from another person bathing (including washing, rinsing, drying)?: None Help from another person to put on and taking off regular upper body clothing?: None Help from another person to put on and taking off regular lower body clothing?: None 6 Click Score: 24   End of Session Nurse Communication: Mobility status;Other (comment) (pt indep.)  Activity Tolerance: Patient tolerated treatment well Patient left: in chair;with call bell/phone within reach  OT Visit Diagnosis: Muscle weakness (generalized) (M62.81)                Time: 3335-4562 OT Time Calculation (min): 18 min Charges:  OT General Charges $OT Visit: 1 Visit OT Evaluation $OT Eval Low Complexity: 1 Low   Mallissa Lorenzen A  Saaya Procell 10/27/2021, 5:10 PM

## 2021-10-27 NOTE — Evaluation (Signed)
Physical Therapy Evaluation and Discharge Patient Details Name: Elaine Burch MRN: [id number] DOB: 08-03-1966 Today's Date: 10/27/2021  History of Present Illness  Pt is 56 yo female who presents with acute neck pain and dizziness with L HA and emesis x2. Pt with L vertebral artery dissection. PMH: arthritis, L shoulder issues, HTN, HLD, GERD, DM.  Clinical Impression  Patient evaluated by Physical Therapy with no further acute PT needs identified. All education has been completed and the patient has no further questions. Pt independent on PT eval, ambulated 400' without AD with ability to make directional changes, ascend and descend stairs, and assume normal pace. Pt able to reach to floor and return without dizziness of LOB. BLE strength full and equal. Noted swelling R hand and decreased grip strength. Pt able to write name with R hand and use it to grasp objects. OT to further evaluate.  See below for any follow-up Physical Therapy or equipment needs. PT is signing off. Thank you for this referral.        Recommendations for follow up therapy are one component of a multi-disciplinary discharge planning process, led by the attending physician.  Recommendations may be updated based on patient status, additional functional criteria and insurance authorization.  Follow Up Recommendations No PT follow up    Assistance Recommended at Discharge None  Patient can return home with the following  Assist for transportation    Equipment Recommendations None recommended by PT  Recommendations for Other Services       Functional Status Assessment Patient has not had a recent decline in their functional status     Precautions / Restrictions Precautions Precautions: None Restrictions Weight Bearing Restrictions: No      Mobility  Bed Mobility Overal bed mobility: Independent                  Transfers Overall transfer level: Independent Equipment used: None                     Ambulation/Gait Ambulation/Gait assistance: Modified independent (Device/Increase time) Gait Distance (Feet): 400 Feet Assistive device: None Gait Pattern/deviations: WFL(Within Functional Limits) Gait velocity: WFL Gait velocity interpretation: >4.37 ft/sec, indicative of normal walking speed   General Gait Details: pt began with cautious gait but speed and fluidity improved as pt kept ambulating  Stairs Stairs: Yes Stairs assistance: Modified independent (Device/Increase time) Stair Management: One rail Right, Alternating pattern, Forwards Number of Stairs: 4 General stair comments: no difficulty on stairs with alternating pattern  Wheelchair Mobility    Modified Rankin (Stroke Patients Only) Modified Rankin (Stroke Patients Only) Pre-Morbid Rankin Score: No symptoms Modified Rankin: No significant disability     Balance Overall balance assessment: Modified Independent                                           Pertinent Vitals/Pain Pain Assessment Pain Assessment: No/denies pain    Home Living Family/patient expects to be discharged to:: Private residence Living Arrangements: Spouse/significant other Available Help at Discharge: Family;Available PRN/intermittently Type of Home: House Home Access: Stairs to enter   Entrance Stairs-Number of Steps: 4   Home Layout: One level Home Equipment: None Additional Comments: pt works nights cleaning offices. Takes care of home and kids in the day. Husband works at a nursery (plants)    Prior Function Prior Level of Function : Independent/Modified Independent;Working/employed  Mobility Comments: does not drive at baseline. Independent with mobility ADLs Comments: independent     Hand Dominance   Dominant Hand: Right    Extremity/Trunk Assessment   Upper Extremity Assessment Upper Extremity Assessment: Defer to OT evaluation;RUE deficits/detail RUE Deficits /  Details: R hand swelling and decreased grip strength. IV R antecubital. Able to write name smoothly and with good handwriting    Lower Extremity Assessment Lower Extremity Assessment: Overall WFL for tasks assessed;RLE deficits/detail RLE Deficits / Details: strength 5/5 thoughout, R=L RLE Sensation: WNL RLE Coordination: WNL    Cervical / Trunk Assessment Cervical / Trunk Assessment: Normal  Communication   Communication: Prefers language other than [place];No difficulties (Spanish)  Cognition Arousal/Alertness: Awake/alert Behavior During Therapy: WFL for tasks assessed/performed Overall Cognitive Status: Within Functional Limits for tasks assessed                                          General Comments General comments (skin integrity, edema, etc.): pt able to reach down to floor to pick up object, navigate around obstacles in room, denied dizziness with turns and changes in head position    Exercises     Assessment/Plan    PT Assessment Patient does not need any further PT services  PT Problem List         PT Treatment Interventions      PT Goals (Current goals can be found in the Care Plan section)  Acute Rehab PT Goals Patient Stated Goal: get a shower PT Goal Formulation: All assessment and education complete, DC therapy    Frequency       Co-evaluation               AM-PAC PT "6 Clicks" Mobility  Outcome Measure Help needed turning from your back to your side while in a flat bed without using bedrails?: None Help needed moving from lying on your back to sitting on the side of a flat bed without using bedrails?: None Help needed moving to and from a bed to a chair (including a wheelchair)?: None Help needed standing up from a chair using your arms (e.g., wheelchair or bedside chair)?: None Help needed to walk in hospital room?: None Help needed climbing 3-5 steps with a railing? : None 6 Click Score: 24    End of Session  Equipment Utilized During Treatment: Gait belt Activity Tolerance: Patient tolerated treatment well Patient left: in chair;with call bell/phone within reach;with chair alarm set Nurse Communication: Mobility status PT Visit Diagnosis: Unsteadiness on feet (R26.81)    Time: 7001-7494 PT Time Calculation (min) (ACUTE ONLY): 17 min   Charges:   PT Evaluation $PT Eval Moderate Complexity: [place]  Pager [phone] Office [place] 10/27/2021, 3:42 PM

## 2021-10-27 NOTE — Progress Notes (Signed)
ANTICOAGULATION CONSULT NOTE - Follow-up Consult  Pharmacy Consult for heparin Indication:  vertebral dissection  No Known Allergies  Patient Measurements: Height: 5\' 2"  (157.5 cm) Weight: 82 kg (180 lb 12.4 oz) IBW/kg (Calculated) : 50.1 Heparin Dosing Weight: 82 kg   Vital Signs: BP: 95/65 (01/24 0400) Pulse Rate: 57 (01/24 0400)  Labs: Recent Labs    10/26/21 0238 10/26/21 0252 10/26/21 1236 10/26/21 1945 10/27/21 0500  HGB 15.1* 16.0*  --   --  14.0  HCT 44.5 47.0*  --   --  40.7  PLT 185  --   --   --  184  LABPROT 12.5  --   --   --   --   INR 0.9  --   --   --   --   HEPARINUNFRC  --   --  0.54 1.05* 0.66  CREATININE 0.52 0.40*  --   --   --      Estimated Creatinine Clearance: 80.8 mL/min (A) (by C-G formula based on SCr of 0.4 mg/dL (L)).   Medical History: Past Medical History:  Diagnosis Date   Arthritis    beginning   Diabetes mellitus without complication (HCC)    GERD (gastroesophageal reflux disease)    past hx of GERD   Hyperlipidemia     Medications:  (Not in a hospital admission)  Assessment: 58 YOF who presented with neck pain and leg weakness with concern for vertebral dissection on CT. Pharmacy consulted to start IV heparin.   HL this AM is therapeutic on 1100 units/hr. H/H and Plt wnl. RN reports no s/s of overt bleeding.   Goal of Therapy:  Heparin level 0.3-0.7 units/ml Monitor platelets by anticoagulation protocol: Yes   Plan:  Continue heparin gtt at 1100 units/hr 6 hour heparin level check Daily heparin level, CBC Monitor for signs and symptoms of bleeding Follow-up vascular recommendations for oral anticoagulation  Albertina Parr, PharmD., BCCCP Clinical Pharmacist Please refer to [facility] for unit-specific pharmacist

## 2021-10-28 ENCOUNTER — Telehealth: Payer: Self-pay

## 2021-10-28 NOTE — Telephone Encounter (Signed)
Transition Care Management Follow-up Telephone Call  Call placed with assistance of Spanish interpreter # [phone] Interpreters Date of discharge and from where: 10/27/2021, [facility] How have you been since you were released from the hospital? She said she is feeling okay now.  Earlier this morning, she said she " started a little headache" but took tylenol and all of her other medications and she is fine now.  Any questions or concerns? No  Items Reviewed: Did the pt receive and understand the discharge instructions provided? Yes  - she said they gave them to her in Spanish.  Medications obtained and verified? Yes  - she said she has all medications and did not have any questions about her med regime.  Other? No  Any new allergies since your discharge? No  Dietary orders reviewed? No Do you have support at home? Yes   Home Care and Equipment/Supplies: Were home health services ordered? no If so, what is the name of the agency? N/a  Has the agency set up a time to come to the patient's home? not applicable Were any new equipment or medical supplies ordered?  No What is the name of the medical supply agency? N/a Were you able to get the supplies/equipment? not applicable Do you have any questions related to the use of the equipment or supplies? No  Functional Questionnaire: (I = Independent and D = Dependent) ADLs: independent  Follow up appointments reviewed:  PCP Hospital f/u appt confirmed? Yes  Scheduled to see Durene Fruits, NP on 11/09/2021. She wishes to have Ms Minette Brine as her her PCP.  Dr Redmond Pulling was listed as PCP but patient has not seen Dr Redmond Pulling.  [facility] f/u appt confirmed? Yes  Scheduled to see orthopedics- 11/13/2021; GYN- 11/20/2021  Are transportation arrangements needed? No - her husband drives her or she takes [employer] Popper If their condition worsens, is the pt aware to call PCP or go to the Emergency Dept.? Yes Was the patient provided with contact  information for the PCP's office or ED? Yes Was to pt encouraged to call back with questions or concerns? Yes

## 2021-11-04 NOTE — Progress Notes (Signed)
TRANSITION OF CARE VISIT   Primary Care Provider (PCP):       Durene Fruits, NP                                                     [facility] [address] [place],  [place]  [address]   Phone: [phone]  Fax: [phone]     Date of Admission: 10/26/2021  Date of Discharge: 10/27/2021  Transitions of Care Call: 10/28/2021  Discharged from: [facility]   Discharge Diagnosis:  Principal Problem:   Vertebral artery dissection [facility]) Active Problems:   Type 2 diabetes mellitus without complication, without long-term current use of insulin ([place])   Hyperlipidemia   Summary of Admission per MD note:  Left vertebral artery dissection - This morning the patient told me her dizziness is improving as is her neck pain - Appreciate vascular surgery eval-DOAC as recommended-Dr. Doren Custard is arranging a CTA of her neck in 6 months along with follow-up - pharmacy recommending 5 mg BID dose - Have requested TOC assistance and will receive a 30-day free card for Eliquis-further Eliquis assistance being pursued by TOC-the patient also states that her primary care practice has been assisting with cost of meds  TODAY's VISIT: Feeling better since hospital discharge. However, still having mild headaches and dizziness when bending down. Denies nausea and vomiting. Still taking Eliquis. No appointment with Vascular Surgery as of present. No further issues/concerns.  Patient/Caregiver self-reported problems/concerns: see above  MEDICATIONS  Medication Reconciliation conducted with patient/caregiver? (Yes/ No):Yes  New medications prescribed/discontinued upon discharge? (Yes/No): Yes  Barriers identified related to medications: No  LABS  Lab Reviewed (Yes/No/NA): Yes  PHYSICAL EXAM:  Physical Exam HENT:     Head: Normocephalic and atraumatic.  Eyes:     Extraocular Movements:  Extraocular movements intact.     Conjunctiva/sclera: Conjunctivae normal.     Pupils: Pupils are equal, round, and reactive to light.  Cardiovascular:     Rate and Rhythm: Normal rate and regular rhythm.     Pulses: Normal pulses.     Heart sounds: Normal heart sounds.  Pulmonary:     Effort: Pulmonary effort is normal.     Breath sounds: Normal breath sounds.  Abdominal:     General: Bowel sounds are normal.     Palpations: Abdomen is soft.  Musculoskeletal:     Cervical back: Normal range of motion and neck supple.  Neurological:     General: No focal deficit present.     Mental Status: She is alert and oriented to person, place, and time.  Psychiatric:        Mood and Affect: Mood normal.        Behavior: Behavior normal.    ASSESSMENT & PLAN: 1. Hospital discharge follow-up: - Reviewed hospital course, current medications, ensured proper follow-up in place, and addressed concerns.   2. Vertebral artery dissection ([place]): - Continue Apixaban as prescribed. No refills needed as of present.  - Referral to Vascular Surgery for further evaluation and management. - Ambulatory referral to Vascular Surgery  3. Language barrier: - [employer] participated during today's appointment. Interpreter Name: Tammi Klippel, ID#: [id number].   PATIENT EDUCATION PROVIDED: See AVS   FOLLOW-UP (Include any further testing or referrals): - Referral to Vascular Surgery.  -  Follow-up with primary provider as scheduled.

## 2021-11-09 ENCOUNTER — Encounter: Payer: Self-pay | Admitting: Family

## 2021-11-09 ENCOUNTER — Ambulatory Visit (INDEPENDENT_AMBULATORY_CARE_PROVIDER_SITE_OTHER): Payer: Self-pay | Admitting: Family

## 2021-11-09 ENCOUNTER — Other Ambulatory Visit: Payer: Self-pay

## 2021-11-09 VITALS — BP 110/69 | HR 64 | Temp 98.3°F | Resp 18 | Ht 62.01 in | Wt 182.0 lb

## 2021-11-09 DIAGNOSIS — I7774 Dissection of vertebral artery: Secondary | ICD-10-CM

## 2021-11-09 DIAGNOSIS — Z789 Other specified health status: Secondary | ICD-10-CM

## 2021-11-09 DIAGNOSIS — Z758 Other problems related to medical facilities and other health care: Secondary | ICD-10-CM

## 2021-11-09 DIAGNOSIS — Z09 Encounter for follow-up examination after completed treatment for conditions other than malignant neoplasm: Secondary | ICD-10-CM

## 2021-11-12 NOTE — Therapy (Signed)
OUTPATIENT PHYSICAL THERAPY THORACOLUMBAR EVALUATION   Patient Name: Elaine Burch MRN: [id number] DOB:May 14, 1966, 56 y.o., female Today's Date: 11/13/2021   PT End of Session - 11/13/21 1605     Visit Number 1    Number of Visits 13    Date for PT Re-Evaluation 01/01/22    Authorization Type Self pay    Authorization Time Period Reassess FOTO at the 6th and 12th visits    PT Start Time 0935    PT Stop Time 1020    PT Time Calculation (min) 45 min    Activity Tolerance Patient tolerated treatment well    Behavior During Therapy WFL for tasks assessed/performed             Past Medical History:  Diagnosis Date   Arthritis    beginning   Diabetes mellitus without complication ([place])    GERD (gastroesophageal reflux disease)    past hx of GERD   Hyperlipidemia    Past Surgical History:  Procedure Laterality Date   NO PAST SURGERIES     Patient Active Problem List   Diagnosis Date Noted   Vertebral artery dissection ([place]) 10/26/2021   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 05/26/2021   Impingement syndrome of right shoulder 05/26/2021   Type 2 diabetes mellitus without complication, without long-term current use of insulin ([place]) 02/24/2021   Hyperlipidemia 02/24/2021   Muscle spasm of back 11/24/2013    PCP: Camillia Herter, NP  REFERRING PROVIDER: Mcarthur Rossetti, MD;  Pete Pelt, PA-C  REFERRING DIAG:   Pain in thoracic spine Chronic midline low back pain without sciatica Trochanteric bursitis, left hip Trochanteric bursitis, right hip  THERAPY DIAG:  Chronic bilateral low back pain without sciatica  Cramp and spasm  Muscle weakness (generalized)  ONSET DATE: 2006, 2018  SUBJECTIVE:                                                                                                                                                                                           SUBJECTIVE STATEMENT: Pt reports 2 falls in the  above years where she slipped landed on her bottom. She notes her back has been bothering her more recently. She indicates the pain is in the area of her low back and upper gluteal, L>R.  PERTINENT HISTORY:  Arthritis, Obesity, DM2  PAIN:  Are you having pain? Yes NPRS scale: 5/10, 2-7 pain range Pain location: low back, hips Pain orientation: Bilateral  PAIN TYPE: aching Pain description: intermittent  Aggravating factors: Bending and reaching- pt works Tax inspector Relieving factors: Tylenol, heat, rest  PRECAUTIONS: None  WEIGHT BEARING  RESTRICTIONS No  FALLS:  Has patient fallen in last 6 months? No  LIVING ENVIRONMENT: Lives with: lives with their family Pt reports no diffificulty with accessing or mobiltity within home  OCCUPATION: Cleaning offices  PLOF: Independent  PATIENT GOALS To beter manage the pain   OBJECTIVE:   DIAGNOSTIC FINDINGS:   LUMBAR SPINE - Xray 04/08/21 IMPRESSION: Mild degenerative changes.  MRI Thoracic spine 05/05/21 MPRESSION: 1. No acute osseous abnormality of the thoracic spine. 2. Tiny shallow noncompressive disc protrusion at T8-9. Otherwise, no significant degenerative disc disease. No evidence of canal or foraminal stenosis at any level. 3. Cholelithiasis.  PATIENT SURVEYS:  FOTO 56% Lumbar; 52% hips  SCREENING FOR RED FLAGS: Bowel or bladder incontinence: No   COGNITION:  Overall cognitive status: Within functional limits for tasks assessed     SENSATION:  Light touch: Appears intact    MUSCLE LENGTH: Hamstrings: Right WNLs; Left WFLs Thomas test: Right Tight; Left tight  POSTURE:  Forward head, increased lordosis  PALPATION: TTP of lumbar paraspinals L>R with increased muscle tension  LUMBARAROM/PROM  A/PROM A/PROM  11/13/2021  Flexion Full, tightness of LB and post legs  Extension Full, sharp pain in low back  Right lateral flexion Min limited, tightness l LB  Left lateral flexion Min limited,  tightness of the R LB  Right rotation Full, no provocation  Left rotation Full, no provocation   (Blank rows = not tested)  LE AROM/PROM: Grossly WNLs  LE MMT:  MMT Right 11/13/2021 Left 11/13/2021  Hip flexion 4+ 4+  Hip extension 4+ 4+  Hip abduction 5 5  Hip adduction 5 5  Hip internal rotation 5 5  Hip external rotation 5 5  Knee flexion 5 5  Knee extension 5 5    Core weakness   LUMBAR SPECIAL TESTS:  Straight leg raise test: Negative, Slump test: Negative, and SI Compression/distraction test: Negative  FUNCTIONAL TESTS:  5 times sit to stand: TBA 2 minute walk test: TBA  GAIT: Distance walked: 200 ft in clinic Assistive device utilized: None Level of assistance: Complete Independence Comments: Gait pattern WNLs  TODAY'S TREATMENT  Access Code: [email or web address] URL: [email or web address] Date: 11/13/2021 Prepared by: Gar Ponto  Exercises Hooklying Single Knee to Chest Stretch - 3 reps, 10" Supine Bridge - 10 reps, 3" Supine March - 10 reps, 3"    PATIENT EDUCATION:  Education details: Eval findings, POC, HEP Person educated: Patient Education method: Explanation, Demonstration, Tactile cues, Verbal cues, and Handouts Education comprehension: verbalized understanding, returned demonstration, verbal cues required, and tactile cues required   HOME EXERCISE PROGRAM: Access Code: [email or web address] URL: [email or web address] Date: 11/13/2021 Prepared by: Gar Ponto  Exercises Hooklying Single Knee to Chest Stretch - 2 x daily - 7 x weekly - 3 sets - 5 reps Supine Bridge - 2 x daily - 7 x weekly - 3 sets - 15 reps Supine March - 2 x daily - 7 x weekly - 3 sets - 15 reps   ASSESSMENT:  CLINICAL IMPRESSION: Patient is a 56 y.o. F who was seen today for physical therapy evaluation and treatment for chronic LBP s sciatica, thoracic pain, and bilat hip pain. Pt reports the most significant pain is with her low back. Issues for dysfunction  include mild degenerative changes of the lumbar spine 7/22, a physically demanding job, and decreased core strength. These impairments are limiting patient from cleaning, occupation, laundry, and yard work. Personal factors including Arthritis, Obesity, DM2 are also affecting patient's functional outcome. Patient will  benefit from skilled PT to address above impairments and improve overall function.  decreased ROM, decreased strength, increased muscle spasms, obesity, and pain  REHAB POTENTIAL: Good  CLINICAL DECISION MAKING: Stable/uncomplicated  EVALUATION COMPLEXITY: Low   GOALS:  SHORT TERM GOALS= LTGS  LONG TERM GOALS:   LTG Name Target Date Goal status  1 Pt will be Ind in a HEP to maintain achieved LOF Baseline:started on eval 01/01/22 INITIAL  2 Pt will voice understanding of measures to decrease and manage her pain Baseline: 01/01/22 INITIAL  3 Increase core strength to improve trunk stability and minimize strain with work related activites Baseline: 01/01/22 INITIAL  4 Pt will demonstrated proper body mechanics for bending, stooping, kneeling and lifting Baseline: 01/01/22 INITIAL  5 Improve FOTO functional status score to 64% lumbar and 65% hips Baseline: 56% lumbar, 52% hips 01/01/22 INITIAL   PLAN: PT FREQUENCY: 2x/week  PT DURATION: 6 weeks  PLANNED INTERVENTIONS: Therapeutic exercises, Therapeutic activity, Neuro Muscular re-education, Balance training, Gait training, Patient/Family education, Joint mobilization, Stair training, Aquatic Therapy, Dry Needling, Electrical stimulation, Cryotherapy, Moist heat, Taping, Traction, Ultrasound, Ionotophoresis 4mg /ml Dexamethasone, and Manual therapy  PLAN FOR NEXT SESSION: Assess pt's response to HEP; progress therex as indicated; use of modalities, TPDN, and manual therapy as indicated; review Saintclair Halsted MS, PT 11/13/21 5:05 PM

## 2021-11-13 ENCOUNTER — Ambulatory Visit: Payer: Medicaid Other | Attending: Orthopaedic Surgery

## 2021-11-13 ENCOUNTER — Other Ambulatory Visit: Payer: Self-pay

## 2021-11-13 DIAGNOSIS — R252 Cramp and spasm: Secondary | ICD-10-CM | POA: Diagnosis present

## 2021-11-13 DIAGNOSIS — M545 Low back pain, unspecified: Secondary | ICD-10-CM | POA: Diagnosis present

## 2021-11-13 DIAGNOSIS — M6281 Muscle weakness (generalized): Secondary | ICD-10-CM | POA: Insufficient documentation

## 2021-11-13 DIAGNOSIS — G8929 Other chronic pain: Secondary | ICD-10-CM | POA: Insufficient documentation

## 2021-11-18 ENCOUNTER — Ambulatory Visit: Payer: Medicaid Other | Admitting: Physical Therapy

## 2021-11-20 ENCOUNTER — Other Ambulatory Visit: Payer: Self-pay

## 2021-11-20 ENCOUNTER — Ambulatory Visit (INDEPENDENT_AMBULATORY_CARE_PROVIDER_SITE_OTHER): Payer: Self-pay | Admitting: Family Medicine

## 2021-11-20 ENCOUNTER — Other Ambulatory Visit (HOSPITAL_COMMUNITY)
Admission: RE | Admit: 2021-11-20 | Discharge: 2021-11-20 | Disposition: A | Discharge status: Home / Self Care | Payer: Medicaid Other | Source: Ambulatory Visit | Attending: Family Medicine | Admitting: Family Medicine

## 2021-11-20 ENCOUNTER — Encounter: Payer: Self-pay | Admitting: Family Medicine

## 2021-11-20 VITALS — BP 144/85 | HR 65 | Wt 183.8 lb

## 2021-11-20 DIAGNOSIS — Z124 Encounter for screening for malignant neoplasm of cervix: Secondary | ICD-10-CM | POA: Insufficient documentation

## 2021-11-20 DIAGNOSIS — Z01419 Encounter for gynecological examination (general) (routine) without abnormal findings: Secondary | ICD-10-CM

## 2021-11-20 DIAGNOSIS — R102 Pelvic and perineal pain: Secondary | ICD-10-CM

## 2021-11-20 NOTE — Progress Notes (Signed)
No cycle for 1 year No vaginal bleeding.  Only c/o vaginal dryness with intermittent sharp pain and ovary pain and decreased libido. Rainie Crenshaw,RN   PUS scheduled for 2/22 at 8 am at [facility]

## 2021-11-20 NOTE — Progress Notes (Signed)
GYNECOLOGY ANNUAL PREVENTATIVE CARE ENCOUNTER NOTE  Subjective:   Elaine Burch is a 56 y.o. [phone] female here for a routine annual gynecologic exam.  Current complaints: ovarian pain  Bilateral adnexal pain: present for > 1 year.   No worsening or improving elements. Irritating in nature. Feels during sex and during exam. Denies discharge. Denies family history of ovarian cancer.   Question about risk of pregnancy- she wants to make sure she is not at risk  She is having vaginal dryness-- present for several months. No worsening but peristent. Has not tried anything  Denies abnormal vaginal bleeding, discharge, pelvic pain, problems with intercourse or other gynecologic concerns.    Gynecologic History LMP > 1 year ago No LMP recorded (lmp unknown). Patient is perimenopausal. Contraception: post menopausal status Last Pap: 2020. Results were: normal- cytology only Last mammogram: 7/22. Results were: normal  Health Maintenance Due  Topic Date Due   FOOT EXAM  Never done   OPHTHALMOLOGY EXAM  Never done   Zoster Vaccines- Shingrix (1 of 2) Never done    The following portions of the patient's history were reviewed and updated as appropriate: allergies, current medications, past family history, past medical history, past social history, past surgical history and problem list.  Review of Systems Pertinent items are noted in HPI.   Objective:  BP (!) 144/85    Pulse 65    Wt 183 lb 12.8 oz (83.4 kg)    LMP  (LMP Unknown) Comment: no cycle for 1 year.   BMI 33.61 kg/m  CONSTITUTIONAL: Well-developed, well-nourished female in no acute distress.  HENT:  Normocephalic, atraumatic, External right and left ear normal. Oropharynx is clear and moist EYES:  No scleral icterus.  NECK: Normal range of motion, supple, no masses.  Normal thyroid.  SKIN: Skin is warm and dry. No rash noted. Not diaphoretic. No erythema. No pallor. NEUROLOGIC: Alert and oriented to person,  place, and time. Normal reflexes, muscle tone coordination. No cranial nerve deficit noted. PSYCHIATRIC: Normal mood and affect. Normal behavior. Normal judgment and thought content. CARDIOVASCULAR: Normal heart rate noted, regular rhythm. 2+ distal pulses. RESPIRATORY: Effort and breath sounds normal, no problems with respiration noted. BREASTS: deferred- has no concern about breasts today and had mammogram in 7/22 ABDOMEN: Soft,  no distention noted.  No tenderness, rebound or guarding.  PELVIC: Normal appearing external genitalia; Mildly atrophic vaginal mucosa and cervix. No abnormal discharge noted.  Pap smear obtained.  Normal uterine size, no other palpable masses, no uterine  tenderness. + bilateral adnexal tenderness, no masses palpated (though has significant abdominal adipose) MUSCULOSKELETAL: Normal range of motion.    Assessment and Plan:    1. Well woman exam with routine gynecological exam - Cytology - PAP( [place])  2. Cervical cancer screening - Cytology - PAP( [place])  3. Adnexal pain - Need for imaging to assess for adnexal pathology.  - Unlikely infectious - US PELVIC COMPLETE WITH TRANSVAGINAL; Future  4. Postmenopausal Clearly told patient that she does not need to worry about pregnancy.  Discussed typical vasomotor sx. Reviewed medication management with Effexor vs HRT. Discussed risks and benefits of each in detail. Managed expectations of full relief vs partial alleviation.   Regarding HRT: Uterus is present. Discussed risks of HRT with regard to uterine and breast cancer along with benefits for sx management and bone density/heart health.  Patient is not interested in treatment at this time.   Reviewed possible vaginal estrogen to help  with vaginal atrophy. Patient declines and will try lubricants per below.   Review use of lubricant and recommended Crisco for vulvar health and lubricant.  Discussed mind-body connection and role of increased sexual  activity in increase desire for sexual activity.  Plan to follow up on sx at next visit/continue to discuss SNRI therapy, HRT, vaginal estrogen prn   Please refer to After Visit Summary for other counseling recommendations.   Return in about 2 weeks (around 12/04/2021) for after [place] to review results.  Future Appointments  Date Time Provider Weed  11/23/2021  3:45 PM Aliene Altes [facility]  11/25/2021  8:00 AM [facility] 1 [facility] [place]  11/27/2021  8:45 AM Ralls, Laurin Coder [facility]  11/30/2021  3:45 PM Clint Guy, PT [facility]  12/04/2021  8:00 AM Ralls, Zenia Resides, PT [facility]  12/07/2021  3:00 PM Aliene Altes [facility]  12/09/2021  3:35 PM Jacob Moores [facility]  12/11/2021  8:00 AM Gar Ponto, PT [facility]  12/14/2021  3:45 PM Clint Guy, PT [facility]  12/18/2021  8:00 AM Angeline Slim [facility]  12/21/2021  1:45 PM Mcarthur Rossetti, MD [facility] None  12/22/2021  3:45 PM Ralls, Zenia Resides, PT [facility]  12/25/2021  8:00 AM Angeline Slim [facility]  12/28/2021  3:45 PM Clint Guy, PT [facility]  01/01/2022  8:00 AM Ralls, Zenia Resides, PT [facility]     Caren Macadam, MD, MPH, ABFM Attending Physician Center for [facility]

## 2021-11-23 ENCOUNTER — Ambulatory Visit: Payer: Medicaid Other | Admitting: Physical Therapy

## 2021-11-23 ENCOUNTER — Other Ambulatory Visit: Payer: Self-pay

## 2021-11-23 DIAGNOSIS — G8929 Other chronic pain: Secondary | ICD-10-CM

## 2021-11-23 DIAGNOSIS — M6281 Muscle weakness (generalized): Secondary | ICD-10-CM

## 2021-11-23 DIAGNOSIS — M545 Low back pain, unspecified: Secondary | ICD-10-CM | POA: Diagnosis not present

## 2021-11-23 DIAGNOSIS — R252 Cramp and spasm: Secondary | ICD-10-CM

## 2021-11-23 NOTE — Therapy (Signed)
OUTPATIENT PHYSICAL THERAPY TREATMENT NOTE   Patient Name: Elaine Burch MRN: [id number] DOB:[birth year], 56 y.o., female Today's Date: 11/23/2021  PCP: Elaine Herter, NP REFERRING PROVIDER: Camillia Herter, NP   PT End of Session - 11/23/21 1639     Visit Number 2    Number of Visits 13    Date for PT Re-Evaluation 01/01/22    Authorization Type Self pay    Authorization Time Period Reassess FOTO at the 6th and 12th visits    PT Start Time 1545    PT Stop Time 1632    PT Time Calculation (min) 47 min    Activity Tolerance Patient tolerated treatment well    Behavior During Therapy [facility] for tasks assessed/performed             Past Medical History:  Diagnosis Date   Arthritis    beginning   Diabetes mellitus without complication (HCC)    GERD (gastroesophageal reflux disease)    past hx of GERD   Hyperlipidemia    Past Surgical History:  Procedure Laterality Date   NO PAST SURGERIES     Patient Active Problem List   Diagnosis Date Noted   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 05/26/2021   Impingement syndrome of right shoulder 05/26/2021   Type 2 diabetes mellitus without complication, without long-term current use of insulin ([place]) 02/24/2021   Hyperlipidemia 02/24/2021   Muscle spasm of back 11/24/2013    REFERRING DIAG: M54.6 (ICD-10-CM) - Pain in thoracic spine M54.50,G89.29 (ICD-10-CM) - Chronic midline low back pain without sciatica M70.62 (ICD-10-CM) - Trochanteric bursitis, left hip M70.61 (ICD-10-CM) - Trochanteric bursitis, right hip   THERAPY DIAG:  Chronic bilateral low back pain without sciatica  Cramp and spasm  Muscle weakness (generalized)  PERTINENT HISTORY: Arthritis, Obesity, DM2  PRECAUTIONS: none  SUBJECTIVE:  Patient fell this AM while walking up her 3 steps in her home.  She has bruises on both knees.  "I don't know how much I am going to be able to do" today.     PAIN:  Are you having pain? Yes NPRS scale: 4/10 Pain location: waist Pain orientation: Bilateral  PAIN TYPE: aching Pain description: aching  Aggravating factors: pushing, moving , bending and lifting  Relieving factors: rest, sitting   Are you having pain? Yes NPRS scale: 5/10 Pain location: knees  Pain orientation: Bilateral  PAIN TYPE: aching Pain description: min, intermittent 2/10 Aggravating factors: falling  Relieving: not sure       OBJECTIVE:    DIAGNOSTIC FINDINGS:    LUMBAR SPINE - Xray 04/08/21 IMPRESSION: Mild degenerative changes.   MRI Thoracic spine 05/05/21 MPRESSION: 1. No acute osseous abnormality of the thoracic spine. 2. Tiny shallow noncompressive disc protrusion at T8-9. Otherwise, no significant degenerative disc disease. No evidence of canal or foraminal stenosis at any level. 3. Cholelithiasis.   PATIENT SURVEYS:  FOTO 56% Lumbar; 52% hips   SCREENING FOR RED FLAGS: Bowel or bladder incontinence: No     COGNITION:          Overall cognitive status: Within functional limits for tasks assessed                        SENSATION:          Light touch: Appears intact             MUSCLE LENGTH: Hamstrings: Right WNLs; Left WFLs  Thomas test: Right Tight; Left tight   POSTURE:  Forward head, increased lordosis   PALPATION: TTP of lumbar paraspinals L>R with increased muscle tension   LUMBARAROM/PROM   A/PROM A/PROM  11/13/2021  Flexion Full, tightness of LB and post legs  Extension Full, sharp pain in low back  Right lateral flexion Min limited, tightness l LB  Left lateral flexion Min limited, tightness of the R LB  Right rotation Full, no provocation  Left rotation Full, no provocation   (Blank rows = not tested)   LE AROM/PROM: Grossly WNLs   LE MMT:   MMT Right 11/13/2021 Left 11/13/2021  Hip flexion 4+ 4+  Hip extension 4+ 4+  Hip abduction 5 5  Hip adduction 5 5  Hip internal rotation 5 5  Hip external  rotation 5 5  Knee flexion 5 5  Knee extension 5 5                      Core weakness     LUMBAR SPECIAL TESTS:  Straight leg raise test: Negative, Slump test: Negative, and SI Compression/distraction test: Negative   FUNCTIONAL TESTS:  5 times sit to stand: TBA- deferred due to knee pain  2 minute walk test: TBA- deferred due to knee pain    GAIT: Distance walked: 200 ft in clinic Assistive device utilized: None Level of assistance: Complete Independence Comments: Gait pattern WNLs    [facility] Adult PT Treatment:                                                DATE: 11/23/21 Therapeutic Exercise: Supine Lower trunk rotation Knee to chest x 3 x 30 sec Bridging (partial due to incr pain ) x 10  Hamstring stretch 30 sec x 3  Rt ITB 30 sec x 3  Supine Core work, isometric x 10 Supine core march x 15 Table top 90/90 hold painful, cues to lift one leg at a time  Supine core bracing with overhead x 10 with 6 lbs weight  Iso hold 6 lbs chest hgt with march alternating x 10   Modalities: Ice pack 10 min bilateral knees  Self Care: Reviewed posture and body mechanics handout   Reviewed HEP   TODAY'S TREATMENT  Access Code: [email or web address] URL: [email or web address] Date: 11/13/2021 Prepared by: Elaine Burch   Exercises Hooklying Single Knee to Chest Stretch - 3 reps, 10" Supine Bridge - 10 reps, 3" Supine March - 10 reps, 3"       PATIENT EDUCATION:  Education details: Eval findings, POC, HEP Person educated: Patient Education method: Explanation, Demonstration, Tactile cues, Verbal cues, and Handouts Education comprehension: verbalized understanding, returned demonstration, verbal cues required, and tactile cues required     HOME EXERCISE PROGRAM: Access Code: [email or web address] URL: [email or web address] Date: 11/13/2021 Prepared by: Elaine Burch       ASSESSMENT:   CLINICAL IMPRESSION:  [name] did well today, limited to mat exercises to avoid  increasing knee pain .  Knees were brusied with Rt knee bandaged.  No other injuries.  She did well with core exercises, reported no increased pain other than when both legs in a 90/90 position .  Needs cues to avoid lifting both legs simultaneously in order to reduce lumbar strain.  Cont POC.     decreased ROM, decreased strength, increased muscle spasms, obesity, and  pain   REHAB POTENTIAL: Good   CLINICAL DECISION MAKING: Stable/uncomplicated   EVALUATION COMPLEXITY: Low     GOALS:   SHORT TERM GOALS= LTGS   LONG TERM GOALS:    LTG Name Target Date Goal status  1 Pt will be Ind in a HEP to maintain achieved LOF Baseline:started on eval 01/01/22 INITIAL  2 Pt will voice understanding of measures to decrease and manage her pain Baseline: 01/01/22 INITIAL  3 Increase core strength to improve trunk stability and minimize strain with work related activites Baseline: 01/01/22 INITIAL  4 Pt will demonstrated proper body mechanics for bending, stooping, kneeling and lifting Baseline: 01/01/22 INITIAL  5 Improve FOTO functional status score to 64% lumbar and 65% hips Baseline: 56% lumbar, 52% hips 01/01/22 INITIAL    PLAN: PT FREQUENCY: 2x/week   PT DURATION: 6 weeks   PLANNED INTERVENTIONS: Therapeutic exercises, Therapeutic activity, Neuro Muscular re-education, Balance training, Gait training, Patient/Family education, Joint mobilization, Stair training, Aquatic Therapy, Dry Needling, Electrical stimulation, Cryotherapy, Moist heat, Taping, Traction, Ultrasound, Ionotophoresis 4mg /ml Dexamethasone, and Manual therapy   PLAN FOR NEXT SESSION:   Functional testing, resume/progress core and body mechanics   Assess pt's response to HEP; progress therex as indicated; use of modalities, TPDN, and manual therapy as indicated; review Saralyn Pilar, PT 11/23/21 4:40 PM Phone: [phone] Fax: [phone]

## 2021-11-24 ENCOUNTER — Telehealth: Payer: Self-pay

## 2021-11-24 LAB — CYTOLOGY - PAP
Comment: NEGATIVE
Diagnosis: NEGATIVE
High risk HPV: NEGATIVE

## 2021-11-24 NOTE — Telephone Encounter (Addendum)
-----   Message from Caren Macadam, MD sent at 11/24/2021  2:51 PM EST ----- NIL with HPV negative, next in 5 years  Called patient with interpreter Raquel; results and provider recommendation given. Reviewed upcoming appts. Patient asks where to purchase coconut oil for lubricant, reviewed with patient. Also reviewed other options listed in provider note.

## 2021-11-25 ENCOUNTER — Other Ambulatory Visit: Payer: Self-pay

## 2021-11-25 ENCOUNTER — Ambulatory Visit (HOSPITAL_COMMUNITY)
Admission: RE | Admit: 2021-11-25 | Discharge: 2021-11-25 | Disposition: A | Discharge status: Home / Self Care | Payer: Medicaid Other | Source: Ambulatory Visit | Attending: Family Medicine | Admitting: Family Medicine

## 2021-11-25 DIAGNOSIS — Z01419 Encounter for gynecological examination (general) (routine) without abnormal findings: Secondary | ICD-10-CM | POA: Insufficient documentation

## 2021-11-25 DIAGNOSIS — R102 Pelvic and perineal pain: Secondary | ICD-10-CM | POA: Insufficient documentation

## 2021-11-26 NOTE — Therapy (Signed)
OUTPATIENT PHYSICAL THERAPY TREATMENT NOTE   Patient Name: Elaine Burch MRN: [id number] DOB:10/19/1965, 56 y.o., female Today's Date: 11/27/2021  PCP: Camillia Herter, NP REFERRING PROVIDER: Mcarthur Rossetti*   PT End of Session - 11/27/21 [phone]     Visit Number 3    Number of Visits 13    Date for PT Re-Evaluation 01/01/22    Authorization Type Self pay    Authorization Time Period Reassess FOTO at the 6th and 12th visits    PT Start Time 0850    PT Stop Time 0933    PT Time Calculation (min) 43 min    Activity Tolerance Patient tolerated treatment well    Behavior During Therapy [facility] for tasks assessed/performed              Past Medical History:  Diagnosis Date   Arthritis    beginning   Diabetes mellitus without complication ([place])    GERD (gastroesophageal reflux disease)    past hx of GERD   Hyperlipidemia    Past Surgical History:  Procedure Laterality Date   NO PAST SURGERIES     Patient Active Problem List   Diagnosis Date Noted   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 05/26/2021   Impingement syndrome of right shoulder 05/26/2021   Type 2 diabetes mellitus without complication, without long-term current use of insulin ([place]) 02/24/2021   Hyperlipidemia 02/24/2021   Muscle spasm of back 11/24/2013    REFERRING DIAG: M54.6 (ICD-10-CM) - Pain in thoracic spine M54.50,G89.29 (ICD-10-CM) - Chronic midline low back pain without sciatica M70.62 (ICD-10-CM) - Trochanteric bursitis, left hip M70.61 (ICD-10-CM) - Trochanteric bursitis, right hip   THERAPY DIAG:  Chronic bilateral low back pain without sciatica  Cramp and spasm  Muscle weakness (generalized)  PERTINENT HISTORY: Arthritis, Obesity, DM2  PRECAUTIONS: none  SUBJECTIVE:  Pt indicates she is having pain of her low back, waist, and lateral pelvis. Pt reports the LTR ex bothers her low back.   PAIN:  Are you having  pain? Yes NPRS scale: 4/10 Pain location: LB, waist, lateral pelvis Pain orientation: Bilateral  PAIN TYPE: aching Pain description: aching  Aggravating factors: pushing, moving , bending and lifting  Relieving factors: rest, sitting   OBJECTIVE:    DIAGNOSTIC FINDINGS:    LUMBAR SPINE - Xray 04/08/21 IMPRESSION: Mild degenerative changes.   MRI Thoracic spine 05/05/21 MPRESSION: 1. No acute osseous abnormality of the thoracic spine. 2. Tiny shallow noncompressive disc protrusion at T8-9. Otherwise, no significant degenerative disc disease. No evidence of canal or foraminal stenosis at any level. 3. Cholelithiasis.   PATIENT SURVEYS:  FOTO 56% Lumbar; 52% hips   SCREENING FOR RED FLAGS: Bowel or bladder incontinence: No     COGNITION:          Overall cognitive status: Within functional limits for tasks assessed                        SENSATION:          Light touch: Appears intact             MUSCLE LENGTH: Hamstrings: Right WNLs; Left WFLs Thomas test: Right Tight; Left tight   POSTURE:  Forward head, increased lordosis   PALPATION: TTP of lumbar paraspinals L>R with increased muscle tension   LUMBARAROM/PROM   A/PROM A/PROM  11/13/2021  Flexion Full, tightness of LB and post legs  Extension Full,  sharp pain in low back  Right lateral flexion Min limited, tightness l LB  Left lateral flexion Min limited, tightness of the R LB  Right rotation Full, no provocation  Left rotation Full, no provocation   (Blank rows = not tested)   LE AROM/PROM: Grossly WNLs   LE MMT:   MMT Right 11/13/2021 Left 11/13/2021  Hip flexion 4+ 4+  Hip extension 4+ 4+  Hip abduction 5 5  Hip adduction 5 5  Hip internal rotation 5 5  Hip external rotation 5 5  Knee flexion 5 5  Knee extension 5 5                      Core weakness     LUMBAR SPECIAL TESTS:  Straight leg raise test: Negative, Slump test: Negative, and SI Compression/distraction test: Negative    FUNCTIONAL TESTS:  5 times sit to stand: TBA- deferred due to knee pain  2 minute walk test: TBA- deferred due to knee pain    GAIT: Distance walked: 200 ft in clinic Assistive device utilized: None Level of assistance: Complete Independence Comments: Gait pattern WNLs  TODAY'S TREATMENT:  [facility] Adult PT Treatment:                                                DATE: 11/27/21 Therapeutic Exercise: Seated trunk flexion, forward and lateral x3 each 10" Hamstring stretch 15 sec x 2 Piriformis stretch 15 sec x2 Bridging x 10  Supine Core work, isometric x 10 Supine core march x 15 Table top 90/90, cues to lift one leg at a time, x3 10" Updated HEP    [facility] Adult PT Treatment:                                                DATE: 11/23/21 Therapeutic Exercise: Supine Lower trunk rotation Knee to chest x 3 x 30 sec Bridging (partial due to incr pain ) x 10  Hamstring stretch 30 sec x 3  Rt ITB 30 sec x 3  Supine Core work, isometric x 10 Supine core march x 15 Table top 90/90 hold painful, cues to lift one leg at a time  Supine core bracing with overhead x 10 with 6 lbs weight  Iso hold 6 lbs chest hgt with march alternating x 10   Modalities: Ice pack 10 min bilateral knees  Self Care: Reviewed posture and body mechanics handout   Reviewed HEP   Eval TREATMENT:  Exercises Hooklying Single Knee to Chest Stretch - 3 reps, 10" Supine Bridge - 10 reps, 3" Supine March - 10 reps, 3"       PATIENT EDUCATION:  Education details: Eval findings, POC, HEP Person educated: Patient Education method: Explanation, Demonstration, Tactile cues, Verbal cues, and Handouts Education comprehension: verbalized understanding, returned demonstration, verbal cues required, and tactile cues required     HOME EXERCISE PROGRAM: Access Code: [email or web address] URL: [email or web address] Date: 11/27/2021 Prepared by: Gar Ponto  Exercises Seated Flexion Stretch with Swiss Ball - 1 x  daily - 7 x weekly - 1 sets - 10 reps - 10 hold Supine Piriformis Stretch with Foot on Ground - 1 x daily - 7 x weekly -  3 sets - 10 reps - 15 hold Seated Hamstring Stretch - 1 x daily - 7 x weekly - 1 sets - 3 reps - 15 hold Supine Lower Trunk Rotation - 2 x daily - 7 x weekly - 2 sets - 10 reps - 10 hold Supine Transversus Abdominis Bracing - Hands on Stomach - 1 x daily - 7 x weekly - 2 sets - 10 reps - 30 hold Supine Bridge - 2 x daily - 7 x weekly - 3 sets - 15 reps Supine March - 2 x daily - 7 x weekly - 3 sets - 15 reps        ASSESSMENT:   CLINICAL IMPRESSION:  PT was completed for lumbopelvic flexibility and strengthening. Pt tolerated bridging today. Following the session, pt rated her low back as a 2/10. Flexibility exs were added to the pt's HEP. Pt will continue to benefit from skilled PT to address flexibility and trunk stability issues to optimize pt's function with less pain.  IMPAIRMENTS:  Decreased ROM, decreased strength, increased muscle spasms, obesity, and pain   REHAB POTENTIAL: Good   CLINICAL DECISION MAKING: Stable/uncomplicated   EVALUATION COMPLEXITY: Low     GOALS:   SHORT TERM GOALS= LTGS   LONG TERM GOALS:    LTG Name Target Date Goal status  1 Pt will be Ind in a HEP to maintain achieved LOF Baseline:started on eval 01/01/22 INITIAL  2 Pt will voice understanding of measures to decrease and manage her pain Baseline: 01/01/22 INITIAL  3 Increase core strength to improve trunk stability and minimize strain with work related activites Baseline: 01/01/22 INITIAL  4 Pt will demonstrated proper body mechanics for bending, stooping, kneeling and lifting Baseline: 01/01/22 INITIAL  5 Improve FOTO functional status score to 64% lumbar and 65% hips Baseline: 56% lumbar, 52% hips 01/01/22 INITIAL    PLAN: PT FREQUENCY: 2x/week   PT DURATION: 6 weeks   PLANNED INTERVENTIONS: Therapeutic exercises, Therapeutic activity, Neuro Muscular re-education,  Balance training, Gait training, Patient/Family education, Joint mobilization, Stair training, Aquatic Therapy, Dry Needling, Electrical stimulation, Cryotherapy, Moist heat, Taping, Traction, Ultrasound, Ionotophoresis 4mg /ml Dexamethasone, and Manual therapy   PLAN FOR NEXT SESSION:   Functional testing, resume/progress core and body mechanics   Assess pt's response to HEP; progress therex as indicated; use of modalities, TPDN, and manual therapy as indicated; review FOTO.  Kacee Koren MS, PT 11/27/21 12:26 PM

## 2021-11-27 ENCOUNTER — Ambulatory Visit: Payer: Medicaid Other

## 2021-11-27 ENCOUNTER — Other Ambulatory Visit: Payer: Self-pay | Admitting: Family

## 2021-11-27 ENCOUNTER — Telehealth: Payer: Self-pay | Admitting: Family

## 2021-11-27 ENCOUNTER — Other Ambulatory Visit: Payer: Self-pay

## 2021-11-27 DIAGNOSIS — R252 Cramp and spasm: Secondary | ICD-10-CM

## 2021-11-27 DIAGNOSIS — M6281 Muscle weakness (generalized): Secondary | ICD-10-CM

## 2021-11-27 DIAGNOSIS — M545 Low back pain, unspecified: Secondary | ICD-10-CM

## 2021-11-27 DIAGNOSIS — I7774 Dissection of vertebral artery: Secondary | ICD-10-CM

## 2021-11-27 DIAGNOSIS — G8929 Other chronic pain: Secondary | ICD-10-CM

## 2021-11-27 MED ORDER — APIXABAN 5 MG PO TABS
5.0000 mg | ORAL_TABLET | Freq: Two times a day (BID) | ORAL | 0 refills | Status: DC
  Filled 2021-11-27: qty 60, 30d supply, fill #0

## 2021-11-30 ENCOUNTER — Telehealth: Payer: Self-pay

## 2021-11-30 ENCOUNTER — Other Ambulatory Visit: Payer: Self-pay

## 2021-11-30 ENCOUNTER — Ambulatory Visit: Payer: Medicaid Other | Admitting: Physical Therapy

## 2021-11-30 DIAGNOSIS — R252 Cramp and spasm: Secondary | ICD-10-CM

## 2021-11-30 DIAGNOSIS — M545 Low back pain, unspecified: Secondary | ICD-10-CM | POA: Diagnosis not present

## 2021-11-30 DIAGNOSIS — M6281 Muscle weakness (generalized): Secondary | ICD-10-CM

## 2021-11-30 DIAGNOSIS — G8929 Other chronic pain: Secondary | ICD-10-CM

## 2021-11-30 NOTE — Telephone Encounter (Signed)
Call placed to pt with interpreter Raquel. Spoke with pt. Pt given results per Dr Ernestina Patches. Pt verbalized understanding and agreeable to call PCP for follow up care.  Colletta [place], RN

## 2021-11-30 NOTE — Therapy (Signed)
OUTPATIENT PHYSICAL THERAPY TREATMENT NOTE   Patient Name: Elaine Burch MRN: [id number] DOB:1966-07-20, 56 y.o., female Today's Date: 11/30/2021  PCP: Camillia Herter, NP REFERRING PROVIDER: Camillia Herter, NP   PT End of Session - 11/30/21 1544     Visit Number 4    Number of Visits 13    Date for PT Re-Evaluation 01/01/22    Authorization Type Self pay    Authorization Time Period Reassess FOTO at the 6th and 12th visits    PT Start Time 1543    PT Stop Time 1630    PT Time Calculation (min) 47 min    Activity Tolerance Patient tolerated treatment well    Behavior During Therapy [facility] for tasks assessed/performed               Past Medical History:  Diagnosis Date   Arthritis    beginning   Diabetes mellitus without complication (HCC)    GERD (gastroesophageal reflux disease)    past hx of GERD   Hyperlipidemia    Past Surgical History:  Procedure Laterality Date   NO PAST SURGERIES     Patient Active Problem List   Diagnosis Date Noted   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 05/26/2021   Impingement syndrome of right shoulder 05/26/2021   Type 2 diabetes mellitus without complication, without long-term current use of insulin ([place]) 02/24/2021   Hyperlipidemia 02/24/2021   Muscle spasm of back 11/24/2013    REFERRING DIAG: M54.6 (ICD-10-CM) - Pain in thoracic spine M54.50,G89.29 (ICD-10-CM) - Chronic midline low back pain without sciatica M70.62 (ICD-10-CM) - Trochanteric bursitis, left hip M70.61 (ICD-10-CM) - Trochanteric bursitis, right hip   THERAPY DIAG:  Chronic bilateral low back pain without sciatica  Cramp and spasm  Muscle weakness (generalized)  PERTINENT HISTORY: Arthritis, Obesity, DM2  PRECAUTIONS: none  SUBJECTIVE:  Patient worked today. She has been cleared by her gyno for pelvic pain. She still has back and pelvic pain.    PAIN:  Are you having pain?  Yes NPRS scale: 3/10 Pain location: LB, waist, lateral pelvis Pain orientation: Bilateral  PAIN TYPE: aching Pain description: aching  Aggravating factors: pushing, moving , bending and lifting  Relieving factors: rest, sitting   OBJECTIVE:    DIAGNOSTIC FINDINGS:    LUMBAR SPINE - Xray 04/08/21 IMPRESSION: Mild degenerative changes.   MRI Thoracic spine 05/05/21 MPRESSION: 1. No acute osseous abnormality of the thoracic spine. 2. Tiny shallow noncompressive disc protrusion at T8-9. Otherwise, no significant degenerative disc disease. No evidence of canal or foraminal stenosis at any level. 3. Cholelithiasis.   PATIENT SURVEYS:  FOTO 56% Lumbar; 52% hips   SCREENING FOR RED FLAGS: Bowel or bladder incontinence: No     COGNITION:          Overall cognitive status: Within functional limits for tasks assessed                        SENSATION:          Light touch: Appears intact             MUSCLE LENGTH: Hamstrings: Right WNLs; Left WFLs Thomas test: Right Tight; Left tight   POSTURE:  Forward head, increased lordosis   PALPATION: TTP of lumbar paraspinals L>R with increased muscle tension   LUMBARAROM/PROM   A/PROM A/PROM  11/13/2021  Flexion Full, tightness of LB and post legs  Extension Full,  sharp pain in low back  Right lateral flexion Min limited, tightness l LB  Left lateral flexion Min limited, tightness of the R LB  Right rotation Full, no provocation  Left rotation Full, no provocation   (Blank rows = not tested)   LE AROM/PROM: Grossly WNLs   LE MMT:   MMT Right 11/13/2021 Left 11/13/2021  Hip flexion 4+ 4+  Hip extension 4+ 4+  Hip abduction 5 5  Hip adduction 5 5  Hip internal rotation 5 5  Hip external rotation 5 5  Knee flexion 5 5  Knee extension 5 5                      Core weakness     LUMBAR SPECIAL TESTS:  Straight leg raise test: Negative, Slump test: Negative, and SI Compression/distraction test: Negative   FUNCTIONAL  TESTS:  5 times sit to stand: 19 sec  2 minute walk test: 442 feet, no increased pain    GAIT: Distance walked: 200 ft in clinic Assistive device utilized: None Level of assistance: Complete Independence Comments: Gait pattern WNLs  TODAY'S TREATMENT:    [facility] Adult PT Treatment:                                                DATE: 11/30/21 Therapeutic Exercise: 5 x STS 2 min walk Sit to stand x 10 , 10 lbs wgt Sit to stand with chest press x 10  x 10 lbs wgt.  Anti-rotation standing dumbbell pass x 10 , 5 lbs dumbbell Doorway row Green TB x 15  Extension x 15  Single arm pull with rotation x 10 each UE  Upper trunk rotation x 5 each sie, Rt shoulder modification Supine march with corrections, cues Unable to do from 90/90 position Bridging x 10  Bridging with march x 5 each min increase in back pain  Knee to chest each side x 3, 30 sec  Hamstring 30 sec x 3  ITB x 2 each side      [facility] Adult PT Treatment:                                                DATE: 11/27/21 Therapeutic Exercise: Seated trunk flexion, forward and lateral x3 each 10" Hamstring stretch 15 sec x 2 Piriformis stretch 15 sec x2 Bridging x 10  Supine Core work, isometric x 10 Supine core march x 15 Table top 90/90, cues to lift one leg at a time, x3 10" Updated HEP    [facility] Adult PT Treatment:                                                DATE: 11/23/21 Therapeutic Exercise: Supine Lower trunk rotation Knee to chest x 3 x 30 sec Bridging (partial due to incr pain ) x 10  Hamstring stretch 30 sec x 3  Rt ITB 30 sec x 3  Supine Core work, isometric x 10 Supine core march x 15 Table top 90/90 hold painful, cues to lift one leg at a time  Supine core bracing with overhead x 10 with 6 lbs weight  Iso hold 6 lbs chest hgt with march alternating x 10   Modalities: Ice pack 10 min bilateral knees  Self Care: Reviewed posture and body mechanics handout   Reviewed HEP   Eval TREATMENT:   Exercises Hooklying Single Knee to Chest Stretch - 3 reps, 10" Supine Bridge - 10 reps, 3" Supine March - 10 reps, 3"       PATIENT EDUCATION:  Education details: Eval findings, POC, HEP Person educated: Patient Education method: Explanation, Demonstration, Tactile cues, Verbal cues, and Handouts Education comprehension: verbalized understanding, returned demonstration, verbal cues required, and tactile cues required     HOME EXERCISE PROGRAM: Access Code: [email or web address] URL: [email or web address] Date: 11/27/2021 Prepared by: Gar Ponto  Exercises Seated Flexion Stretch with Swiss Ball - 1 x daily - 7 x weekly - 1 sets - 10 reps - 10 hold Supine Piriformis Stretch with Foot on Ground - 1 x daily - 7 x weekly - 3 sets - 10 reps - 15 hold Seated Hamstring Stretch - 1 x daily - 7 x weekly - 1 sets - 3 reps - 15 hold Supine Lower Trunk Rotation - 2 x daily - 7 x weekly - 2 sets - 10 reps - 10 hold Supine Transversus Abdominis Bracing - Hands on Stomach - 1 x daily - 7 x weekly - 2 sets - 10 reps - 30 hold Supine Bridge - 2 x daily - 7 x weekly - 3 sets - 15 reps Supine March - 2 x daily - 7 x weekly - 3 sets - 15 reps added UT rotation Row with green band        ASSESSMENT:   CLINICAL IMPRESSION:  Patient with min back and pelvic pain today.  She will follow up with her primary MD regarding the pelvic pain . She was able to perform all exercises but with more advanced core work she did have some midline lumbar pain . She does feel that therapy is helping her be more aware of her posture and she has more mobility in her spine..    IMPAIRMENTS:  Decreased ROM, decreased strength, increased muscle spasms, obesity, and pain   REHAB POTENTIAL: Good   CLINICAL DECISION MAKING: Stable/uncomplicated   EVALUATION COMPLEXITY: Low     GOALS:   SHORT TERM GOALS= LTGS   LONG TERM GOALS:    LTG Name Target Date Goal status  1 Pt will be Ind in a HEP to maintain  achieved LOF Baseline:started on eval 01/01/22 INITIAL  2 Pt will voice understanding of measures to decrease and manage her pain Baseline: 01/01/22 INITIAL  3 Increase core strength to improve trunk stability and minimize strain with work related activites Baseline: 01/01/22 INITIAL  4 Pt will demonstrated proper body mechanics for bending, stooping, kneeling and lifting Baseline: 01/01/22 INITIAL  5 Improve FOTO functional status score to 64% lumbar and 65% hips Baseline: 56% lumbar, 52% hips 01/01/22 INITIAL    PLAN: PT FREQUENCY: 2x/week   PT DURATION: 6 weeks   PLANNED INTERVENTIONS: Therapeutic exercises, Therapeutic activity, Neuro Muscular re-education, Balance training, Gait training, Patient/Family education, Joint mobilization, Stair training, Aquatic Therapy, Dry Needling, Electrical stimulation, Cryotherapy, Moist heat, Taping, Traction, Ultrasound, Ionotophoresis 4mg /ml Dexamethasone, and Manual therapy   PLAN FOR NEXT SESSION:  Reinforce good body mechanics and posture with work  Cont core and standing as tolerated.     Raeford Razor, PT 11/30/21 4:34 PM Phone: [phone] Fax:  [phone]  °

## 2021-11-30 NOTE — Telephone Encounter (Signed)
-----   Message from Caren Macadam, MD sent at 11/30/2021  9:50 AM EST ----- [place] negative for causes of lower abdominal pain. Recommend PCP follow up.

## 2021-12-03 NOTE — Therapy (Signed)
OUTPATIENT PHYSICAL THERAPY TREATMENT NOTE   Patient Name: Elaine Burch MRN: [id number] DOB:[birth year], 56 y.o., female Today's Date: 12/05/2021  PCP: Camillia Herter, NP REFERRING PROVIDER: Mcarthur Rossetti*   PT End of Session - 12/04/21 0846     Visit Number 5    Number of Visits 13    Date for PT Re-Evaluation 01/01/22    Authorization Type Self pay    Authorization Time Period Reassess FOTO at the 6th and 12th visits    PT Start Time 0805    PT Stop Time 0850    PT Time Calculation (min) 45 min    Activity Tolerance Patient tolerated treatment well    Behavior During Therapy [facility] for tasks assessed/performed                Past Medical History:  Diagnosis Date   Arthritis    beginning   Diabetes mellitus without complication (HCC)    GERD (gastroesophageal reflux disease)    past hx of GERD   Hyperlipidemia    Past Surgical History:  Procedure Laterality Date   NO PAST SURGERIES     Patient Active Problem List   Diagnosis Date Noted   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 05/26/2021   Impingement syndrome of right shoulder 05/26/2021   Type 2 diabetes mellitus without complication, without long-term current use of insulin ([place]) 02/24/2021   Hyperlipidemia 02/24/2021   Muscle spasm of back 11/24/2013    REFERRING DIAG: M54.6 (ICD-10-CM) - Pain in thoracic spine M54.50,G89.29 (ICD-10-CM) - Chronic midline low back pain without sciatica M70.62 (ICD-10-CM) - Trochanteric bursitis, left hip M70.61 (ICD-10-CM) - Trochanteric bursitis, right hip   THERAPY DIAG:  Chronic bilateral low back pain without sciatica  Cramp and spasm  Muscle weakness (generalized)  PERTINENT HISTORY: Arthritis, Obesity, DM2  PRECAUTIONS: none  SUBJECTIVE:  Pt reports her low back is feeling some better.   PAIN:  Are you having pain? Yes NPRS scale: 4/10 Pain location: LB, waist, lateral  pelvis Pain orientation: Bilateral  PAIN TYPE: Constant Pain description: aching  Aggravating factors: pushing, moving , bending and lifting  Relieving factors: rest, sitting   OBJECTIVE:    DIAGNOSTIC FINDINGS:    LUMBAR SPINE - Xray 04/08/21 IMPRESSION: Mild degenerative changes.   MRI Thoracic spine 05/05/21 MPRESSION: 1. No acute osseous abnormality of the thoracic spine. 2. Tiny shallow noncompressive disc protrusion at T8-9. Otherwise, no significant degenerative disc disease. No evidence of canal or foraminal stenosis at any level. 3. Cholelithiasis.   PATIENT SURVEYS:  FOTO 56% Lumbar; 52% hips   SCREENING FOR RED FLAGS: Bowel or bladder incontinence: No     COGNITION:          Overall cognitive status: Within functional limits for tasks assessed                        SENSATION:          Light touch: Appears intact             MUSCLE LENGTH: Hamstrings: Right WNLs; Left WFLs Thomas test: Right Tight; Left tight   POSTURE:  Forward head, increased lordosis   PALPATION: TTP of lumbar paraspinals L>R with increased muscle tension   LUMBARAROM/PROM   A/PROM A/PROM  11/13/2021  Flexion Full, tightness of LB and post legs  Extension Full, sharp pain in low back  Right lateral flexion Min limited, tightness  l LB  Left lateral flexion Min limited, tightness of the R LB  Right rotation Full, no provocation  Left rotation Full, no provocation   (Blank rows = not tested)   LE AROM/PROM: Grossly WNLs   LE MMT:   MMT Right 11/13/2021 Left 11/13/2021  Hip flexion 4+ 4+  Hip extension 4+ 4+  Hip abduction 5 5  Hip adduction 5 5  Hip internal rotation 5 5  Hip external rotation 5 5  Knee flexion 5 5  Knee extension 5 5                      Core weakness     LUMBAR SPECIAL TESTS:  Straight leg raise test: Negative, Slump test: Negative, and SI Compression/distraction test: Negative   FUNCTIONAL TESTS:  5 times sit to stand: 19 sec  2 minute walk  test: 442 feet, no increased pain    GAIT: Distance walked: 200 ft in clinic Assistive device utilized: None Level of assistance: Complete Independence Comments: Gait pattern WNLs  TODAY'S TREATMENT: [facility] Adult PT Treatment:                                                DATE: 12/04/21 Therapeutic Exercise: Sit to stand x 10  Supine core work, isometric x 10 Supine core march x 15 Bridging 2x10  SL clams 2x10 SL hip abd 2x10 Manual Therapy: UPAs of lubar spine, increased LBP LE axial distraction, bilat, decreased LBP   [facility] Adult PT Treatment:                                                DATE: 11/30/21 Therapeutic Exercise: 5 x STS 2 min walk Sit to stand x 10 , 10 lbs wgt Sit to stand with chest press x 10  x 10 lbs wgt.  Anti-rotation standing dumbbell pass x 10 , 5 lbs dumbbell Doorway row Green TB x 15  Extension x 15  Single arm pull with rotation x 10 each UE  Upper trunk rotation x 5 each sie, Rt shoulder modification Supine march with corrections, cues Unable to do from 90/90 position Bridging x 10  Bridging with march x 5 each min increase in back pain  Knee to chest each side x 3, 30 sec  Hamstring 30 sec x 3  ITB x 2 each side   [facility] Adult PT Treatment:                                                DATE: 11/27/21 Therapeutic Exercise: Seated trunk flexion, forward and lateral x3 each 10" Hamstring stretch 15 sec x 2 Piriformis stretch 15 sec x2 Bridging x 10  Supine Core work, isometric x 10 Supine core march x 15 Table top 90/90, cues to lift one leg at a time, x3 10" Updated HEP  PATIENT EDUCATION:  Education details: Eval findings, POC, HEP Person educated: Patient Education method: Explanation, Demonstration, Tactile cues, Verbal cues, and Handouts Education comprehension: verbalized understanding, returned demonstration, verbal cues required, and tactile cues required  HOME EXERCISE PROGRAM: Access Code: [email or web address] URL:  [email or web address] Date: 11/27/2021 Prepared by: Gar Ponto  Exercises Seated Flexion Stretch with Swiss Ball - 1 x daily - 7 x weekly - 1 sets - 10 reps - 10 hold Supine Piriformis Stretch with Foot on Ground - 1 x daily - 7 x weekly - 3 sets - 10 reps - 15 hold Seated Hamstring Stretch - 1 x daily - 7 x weekly - 1 sets - 3 reps - 15 hold Supine Lower Trunk Rotation - 2 x daily - 7 x weekly - 2 sets - 10 reps - 10 hold Supine Transversus Abdominis Bracing - Hands on Stomach - 1 x daily - 7 x weekly - 2 sets - 10 reps - 30 hold Supine Bridge - 2 x daily - 7 x weekly - 3 sets - 15 reps Supine March - 2 x daily - 7 x weekly - 3 sets - 15 reps added UT rotation Row with green band        ASSESSMENT:   CLINICAL IMPRESSION:  Pt presented to PT with mod LBP. Pt responded positively to to axial LE distraction with decreased low back pain, while lumbar UPAs aggravated pain. PT was then completed for lumbopelvic/LE strengthening exs. Pt was encouraged to complete her HEP consistently. Following the session pt stated her low back pain was decreased to 2/10. Pt will continue to benefit from skilled PT to address posture, and lumbar mobility and strength for impoved function with less pain.  IMPAIRMENTS:  Decreased ROM, decreased strength, increased muscle spasms, obesity, and pain   REHAB POTENTIAL: Good   CLINICAL DECISION MAKING: Stable/uncomplicated   EVALUATION COMPLEXITY: Low     GOALS:   SHORT TERM GOALS= LTGS   LONG TERM GOALS:    LTG Name Target Date Goal status  1 Pt will be Ind in a HEP to maintain achieved LOF Baseline:started on eval 01/01/22 INITIAL  2 Pt will voice understanding of measures to decrease and manage her pain. Hot showers, tytelnol Baseline: 01/01/22 MET  3 Increase core strength to improve trunk stability and minimize strain with work related activites Baseline: 01/01/22 INITIAL  4 Pt will demonstrated proper body mechanics for bending,  stooping, kneeling and lifting Baseline: 01/01/22 INITIAL  5 Improve FOTO functional status score to 64% lumbar and 65% hips Baseline: 56% lumbar, 52% hips 01/01/22 INITIAL    PLAN: PT FREQUENCY: 2x/week   PT DURATION: 6 weeks   PLANNED INTERVENTIONS: Therapeutic exercises, Therapeutic activity, Neuro Muscular re-education, Balance training, Gait training, Patient/Family education, Joint mobilization, Stair training, Aquatic Therapy, Dry Needling, Electrical stimulation, Cryotherapy, Moist heat, Taping, Traction, Ultrasound, Ionotophoresis 64m/ml Dexamethasone, and Manual therapy   PLAN FOR NEXT SESSION:  Reinforce good body mechanics and posture with work  Cont core and standing as tolerated.   Complete FOTO  Sahvannah Rieser MS, PT 12/05/21 3:50 PM

## 2021-12-04 ENCOUNTER — Other Ambulatory Visit: Payer: Self-pay

## 2021-12-04 ENCOUNTER — Ambulatory Visit: Payer: Medicaid Other | Attending: Orthopaedic Surgery

## 2021-12-04 DIAGNOSIS — G8929 Other chronic pain: Secondary | ICD-10-CM | POA: Insufficient documentation

## 2021-12-04 DIAGNOSIS — M6281 Muscle weakness (generalized): Secondary | ICD-10-CM | POA: Insufficient documentation

## 2021-12-04 DIAGNOSIS — R252 Cramp and spasm: Secondary | ICD-10-CM | POA: Insufficient documentation

## 2021-12-04 DIAGNOSIS — M545 Low back pain, unspecified: Secondary | ICD-10-CM | POA: Insufficient documentation

## 2021-12-07 ENCOUNTER — Other Ambulatory Visit: Payer: Self-pay

## 2021-12-07 ENCOUNTER — Ambulatory Visit: Payer: Medicaid Other | Admitting: Physical Therapy

## 2021-12-07 DIAGNOSIS — G8929 Other chronic pain: Secondary | ICD-10-CM

## 2021-12-07 DIAGNOSIS — R252 Cramp and spasm: Secondary | ICD-10-CM

## 2021-12-07 DIAGNOSIS — M545 Low back pain, unspecified: Secondary | ICD-10-CM | POA: Diagnosis present

## 2021-12-07 DIAGNOSIS — M6281 Muscle weakness (generalized): Secondary | ICD-10-CM

## 2021-12-07 NOTE — Therapy (Signed)
OUTPATIENT PHYSICAL THERAPY TREATMENT NOTE   Patient Name: Elaine Burch MRN: [id number] DOB:21-May-1966, 56 y.o., female Today's Date: 12/07/2021  PCP: Camillia Herter, NP REFERRING PROVIDER: Camillia Herter, NP   PT End of Session - 12/07/21 1555     Visit Number 6    Number of Visits 13    Date for PT Re-Evaluation 01/01/22    Authorization Type Self pay    Authorization Time Period Reassess FOTO at the 6th and 12th visits    PT Start Time 1501    PT Stop Time 1550    PT Time Calculation (min) 49 min    Activity Tolerance Patient tolerated treatment well    Behavior During Therapy WFL for tasks assessed/performed                 Past Medical History:  Diagnosis Date   Arthritis    beginning   Diabetes mellitus without complication ([place])    GERD (gastroesophageal reflux disease)    past hx of GERD   Hyperlipidemia    Past Surgical History:  Procedure Laterality Date   NO PAST SURGERIES     Patient Active Problem List   Diagnosis Date Noted   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 05/26/2021   Impingement syndrome of right shoulder 05/26/2021   Type 2 diabetes mellitus without complication, without long-term current use of insulin ([place]) 02/24/2021   Hyperlipidemia 02/24/2021   Muscle spasm of back 11/24/2013    REFERRING DIAG: M54.6 (ICD-10-CM) - Pain in thoracic spine M54.50,G89.29 (ICD-10-CM) - Chronic midline low back pain without sciatica M70.62 (ICD-10-CM) - Trochanteric bursitis, left hip M70.61 (ICD-10-CM) - Trochanteric bursitis, right hip   THERAPY DIAG:  Chronic bilateral low back pain without sciatica  Cramp and spasm  Muscle weakness (generalized)  PERTINENT HISTORY: Arthritis, Obesity, DM2  PRECAUTIONS: none  SUBJECTIVE:  When I walk the Rt leg feels like it gets weak and tired (some in the L as well).  It feels like it may give out.  "Like my legs are fainting ". It  has been feeling this way since Friday.    PAIN:  Are you having pain? Yes NPRS scale: 5/10 Pain location: LB, waist, lateral pelvis Pain orientation: Bilateral  PAIN TYPE: Constant Pain description: aching  Aggravating factors: pushing, moving , bending and lifting  Relieving factors: rest, sitting   OBJECTIVE:    DIAGNOSTIC FINDINGS:    LUMBAR SPINE - Xray 04/08/21 IMPRESSION: Mild degenerative changes.   MRI Thoracic spine 05/05/21 MPRESSION: 1. No acute osseous abnormality of the thoracic spine. 2. Tiny shallow noncompressive disc protrusion at T8-9. Otherwise, no significant degenerative disc disease. No evidence of canal or foraminal stenosis at any level. 3. Cholelithiasis.   PATIENT SURVEYS:  FOTO 56% Lumbar; 52% hips   SCREENING FOR RED FLAGS: Bowel or bladder incontinence: No     COGNITION:          Overall cognitive status: Within functional limits for tasks assessed                        SENSATION:          Light touch: Appears intact             MUSCLE LENGTH: Hamstrings: Right WNLs; Left WFLs Thomas test: Right Tight; Left tight   POSTURE:  Forward head, increased lordosis   PALPATION: TTP of lumbar paraspinals L>R with  increased muscle tension   LUMBARAROM/PROM   A/PROM A/PROM  11/13/2021  Flexion Full, tightness of LB and post legs  Extension Full, sharp pain in low back  Right lateral flexion Min limited, tightness l LB  Left lateral flexion Min limited, tightness of the R LB  Right rotation Full, no provocation  Left rotation Full, no provocation   (Blank rows = not tested)   LE AROM/PROM: Grossly WNLs   LE MMT:   MMT Right 11/13/2021 Left 11/13/2021  Hip flexion 4+ 4+  Hip extension 4+ 4+  Hip abduction 5 5  Hip adduction 5 5  Hip internal rotation 5 5  Hip external rotation 5 5  Knee flexion 5 5  Knee extension 5 5                      Core weakness     LUMBAR SPECIAL TESTS:  Straight leg raise test: Negative, Slump  test: Negative, and SI Compression/distraction test: Negative   FUNCTIONAL TESTS:  5 times sit to stand: 19 sec  2 minute walk test: 442 feet, no increased pain    GAIT: Distance walked: 200 ft in clinic Assistive device utilized: None Level of assistance: Complete Independence Comments: Gait pattern WNLs  TODAY'S TREATMENT:  [facility] Adult PT Treatment:                                                DATE: 12/07/21 Therapeutic Exercise: Nustep L6, UE and LE for 5 min  Trial of quadruped -childs pose too painful  Hip stretching : knee crossed push and pull for hip ROM each side  Hamstring with strap x 30 sec  ITB strap 30 sec  Standing hang" at the bar knees bent x 5 added lateral bias x 1 each side  Standing at [employer] x 10 and shoulder ext x 10 for core  Manual Therapy: LAD each LE x 3-5 reps Self care: FOTO results, POC, core   [facility] Adult PT Treatment:                                                DATE: 12/04/21 Therapeutic Exercise: Sit to stand x 10  Supine core work, isometric x 10 Supine core march x 15 Bridging 2x10  SL clams 2x10 SL hip abd 2x10 Manual Therapy: UPAs of lubar spine, increased LBP LE axial distraction, bilat, decreased LBP   [facility] Adult PT Treatment:                                                DATE: 11/30/21 Therapeutic Exercise: 5 x STS 2 min walk Sit to stand x 10 , 10 lbs wgt Sit to stand with chest press x 10  x 10 lbs wgt.  Anti-rotation standing dumbbell pass x 10 , 5 lbs dumbbell Doorway row Green TB x 15  Extension x 15  Single arm pull with rotation x 10 each UE  Upper trunk rotation x 5 each sie, Rt shoulder modification Supine march with corrections, cues Unable to do from 90/90 position  Bridging x 10  Bridging with march x 5 each min increase in back pain  Knee to chest each side x 3, 30 sec  Hamstring 30 sec x 3  ITB x 2 each side   [facility] Adult PT Treatment:                                                DATE:  11/27/21 Therapeutic Exercise: Seated trunk flexion, forward and lateral x3 each 10" Hamstring stretch 15 sec x 2 Piriformis stretch 15 sec x2 Bridging x 10  Supine Core work, isometric x 10 Supine core march x 15 Table top 90/90, cues to lift one leg at a time, x3 10" Updated HEP  PATIENT EDUCATION:  Education details: Eval findings, POC, HEP Person educated: Patient Education method: Explanation, Demonstration, Tactile cues, Verbal cues, and Handouts Education comprehension: verbalized understanding, returned demonstration, verbal cues required, and tactile cues required     HOME EXERCISE PROGRAM: Access Code: [email or web address] URL: [email or web address] Date: 11/27/2021 Prepared by: Gar Ponto  Exercises:  Seated Flexion Stretch with Swiss Ball - 1 x daily - 7 x weekly - 1 sets - 10 reps - 10 hold Supine Piriformis Stretch with Foot on Ground - 1 x daily - 7 x weekly - 3 sets - 10 reps - 15 hold Seated Hamstring Stretch - 1 x daily - 7 x weekly - 1 sets - 3 reps - 15 hold Supine Lower Trunk Rotation - 2 x daily - 7 x weekly - 2 sets - 10 reps - 10 hold Supine Transversus Abdominis Bracing - Hands on Stomach - 1 x daily - 7 x weekly - 2 sets - 10 reps - 30 hold Supine Bridge - 2 x daily - 7 x weekly - 3 sets - 15 reps Supine March - 2 x daily - 7 x weekly - 3 sets - 15 reps added UT rotation Row with green band        ASSESSMENT:   CLINICAL IMPRESSION:  Patient reports a new onset of LE weakness today related to pain in her back.  She has a MD appt 12/21/21 with Dr. Ninfa Linden.  She will continue her visits and cont to benefit from general core and hip exercises.  Hip AROM is limited and painful.  Identifies bridging and clam as aggravating to her back. Will avoid for now. Unclear at this time if patient has a directional bias with her pain . FOTO score appears to be worsening as she has had increased pain since Friday.     IMPAIRMENTS:  Decreased ROM, decreased  strength, increased muscle spasms, obesity, and pain   REHAB POTENTIAL: Good   CLINICAL DECISION MAKING: Stable/uncomplicated   EVALUATION COMPLEXITY: Low     GOALS:   SHORT TERM GOALS= LTGS   LONG TERM GOALS:    LTG Name Target Date Goal status  1 Pt will be Ind in a HEP to maintain achieved LOF Baseline:started on eval 01/01/22 ongoing  2 Pt will voice understanding of measures to decrease and manage her pain. Hot showers, tytelnol Baseline: 01/01/22 MET  3 Increase core strength to improve trunk stability and minimize strain with work related activites Baseline: 01/01/22 Ongoing   4 Pt will demonstrated proper body mechanics for bending, stooping, kneeling and lifting Baseline: 01/01/22 Needs cues   5 Improve FOTO functional status score to  64% lumbar and 65% hips Baseline: 56% lumbar, 52% hips 01/01/22 INITIAL    PLAN: PT FREQUENCY: 2x/week   PT DURATION: 6 weeks   PLANNED INTERVENTIONS: Therapeutic exercises, Therapeutic activity, Neuro Muscular re-education, Balance training, Gait training, Patient/Family education, Joint mobilization, Stair training, Aquatic Therapy, Dry Needling, Electrical stimulation, Cryotherapy, Moist heat, Taping, Traction, Ultrasound, Ionotophoresis 58m/ml Dexamethasone, and Manual therapy   PLAN FOR NEXT SESSION:  Reinforce good body mechanics and posture with work  Cont core and standing as tolerated.  Use modalities as needed  next time if pain continues to be at this level.    JRaeford Razor PT 12/07/21 3:56 PM Phone: [phone]: [phone]

## 2021-12-09 ENCOUNTER — Ambulatory Visit: Payer: Self-pay | Admitting: Family Medicine

## 2021-12-10 ENCOUNTER — Ambulatory Visit (INDEPENDENT_AMBULATORY_CARE_PROVIDER_SITE_OTHER): Payer: Self-pay | Admitting: Vascular Surgery

## 2021-12-10 ENCOUNTER — Encounter: Payer: Self-pay | Admitting: Vascular Surgery

## 2021-12-10 ENCOUNTER — Other Ambulatory Visit: Payer: Self-pay

## 2021-12-10 VITALS — BP 111/65 | HR 69 | Temp 97.9°F | Resp 20 | Ht 61.0 in | Wt 181.0 lb

## 2021-12-10 DIAGNOSIS — I7774 Dissection of vertebral artery: Secondary | ICD-10-CM

## 2021-12-10 NOTE — Therapy (Signed)
OUTPATIENT PHYSICAL THERAPY TREATMENT NOTE   Patient Name: Elaine Burch MRN: [id number] DOB:01-03-1966, 56 y.o., female Today's Date: 12/12/2021  PCP: Camillia Herter, NP REFERRING PROVIDER: Mcarthur Rossetti*   PT End of Session - 12/11/21 0806     Visit Number 7    Number of Visits 13    Date for PT Re-Evaluation 01/01/22    Authorization Type Self pay    Authorization Time Period Reassess FOTO at the 6th and 12th visits    PT Start Time 0805    PT Stop Time 0840    PT Time Calculation (min) 35 min    Activity Tolerance Patient tolerated treatment well    Behavior During Therapy [facility] for tasks assessed/performed                  Past Medical History:  Diagnosis Date   Arthritis    beginning   Diabetes mellitus without complication ([place])    GERD (gastroesophageal reflux disease)    past hx of GERD   Hyperlipidemia    Past Surgical History:  Procedure Laterality Date   NO PAST SURGERIES     Patient Active Problem List   Diagnosis Date Noted   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 05/26/2021   Impingement syndrome of right shoulder 05/26/2021   Type 2 diabetes mellitus without complication, without long-term current use of insulin ([place]) 02/24/2021   Hyperlipidemia 02/24/2021   Muscle spasm of back 11/24/2013    REFERRING DIAG: M54.6 (ICD-10-CM) - Pain in thoracic spine M54.50,G89.29 (ICD-10-CM) - Chronic midline low back pain without sciatica M70.62 (ICD-10-CM) - Trochanteric bursitis, left hip M70.61 (ICD-10-CM) - Trochanteric bursitis, right hip   THERAPY DIAG:  Chronic bilateral low back pain without sciatica  Cramp and spasm  Muscle weakness (generalized)  PERTINENT HISTORY: Arthritis, Obesity, DM2  PRECAUTIONS: none  SUBJECTIVE:  Pt reports her pain is not improving. Some exs bother me which I have stopped (resisted hip abd and bridging)   PAIN:  Are you having  pain? Yes NPRS scale: 4/10 Pain location: LB, waist, lateral pelvis Pain orientation: Bilateral  PAIN TYPE: Constant Pain description: aching  Aggravating factors: pushing, moving , bending and lifting  Relieving factors: rest, sitting   OBJECTIVE:    DIAGNOSTIC FINDINGS:    LUMBAR SPINE - Xray 04/08/21 IMPRESSION: Mild degenerative changes.   MRI Thoracic spine 05/05/21 MPRESSION: 1. No acute osseous abnormality of the thoracic spine. 2. Tiny shallow noncompressive disc protrusion at T8-9. Otherwise, no significant degenerative disc disease. No evidence of canal or foraminal stenosis at any level. 3. Cholelithiasis.   PATIENT SURVEYS:  FOTO 56% Lumbar; 52% hips   SCREENING FOR RED FLAGS: Bowel or bladder incontinence: No     COGNITION:          Overall cognitive status: Within functional limits for tasks assessed                        SENSATION:          Light touch: Appears intact             MUSCLE LENGTH: Hamstrings: Right WNLs; Left WFLs Thomas test: Right Tight; Left tight   POSTURE:  Forward head, increased lordosis   PALPATION: TTP of lumbar paraspinals L>R with increased muscle tension   LUMBARAROM/PROM   A/PROM A/PROM  11/13/2021  Flexion Full, tightness of LB and post legs  Extension Full, sharp pain in low back  Right lateral flexion Min limited, tightness l LB  Left lateral flexion Min limited, tightness of the R LB  Right rotation Full, no provocation  Left rotation Full, no provocation   (Blank rows = not tested)   LE AROM/PROM: Grossly WNLs   LE MMT:   MMT Right 11/13/2021 Left 11/13/2021  Hip flexion 4+ 4+  Hip extension 4+ 4+  Hip abduction 5 5  Hip adduction 5 5  Hip internal rotation 5 5  Hip external rotation 5 5  Knee flexion 5 5  Knee extension 5 5                      Core weakness     LUMBAR SPECIAL TESTS:  Straight leg raise test: Negative, Slump test: Negative, and SI Compression/distraction test: Negative    FUNCTIONAL TESTS:  5 times sit to stand: 19 sec  2 minute walk test: 442 feet, no increased pain    GAIT: Distance walked: 200 ft in clinic Assistive device utilized: None Level of assistance: Complete Independence Comments: Gait pattern WNLs  TODAY'S TREATMENT: [facility] Adult PT Treatment:                                                DATE: 12/11/21 Therapeutic Exercise: Abd bracing x10, 3 sec Marching with abd bracing 2x10 Hip stretching:piriformis 2x20" L; Not completed R, pt had pain with attempting to cross R LE over L. Manual Therapy: LAD B LEs. Relief of L hip/low back pain. With LAD of  R hip pain, pain of the hip is decreased, but returns when LAD is released  [facility] Adult PT Treatment:                                                DATE: 12/07/21 Therapeutic Exercise: Nustep L6, UE and LE for 5 min  Trial of quadruped -childs pose too painful  Hip stretching : knee crossed push and pull for hip ROM each side  Hamstring with strap x 30 sec  ITB strap 30 sec  Standing hang" at the bar knees bent x 5 added lateral bias x 1 each side  Standing at [employer] x 10 and shoulder ext x 10 for core  Manual Therapy: LAD each LE x 3-5 reps Self care: FOTO results, POC, core   [facility] Adult PT Treatment:                                                DATE: 12/04/21 Therapeutic Exercise: Sit to stand x 10  Supine core work, isometric x 10 Supine core march x 15 Bridging 2x10  SL clams 2x10 SL hip abd 2x10 Manual Therapy: UPAs of lubar spine, increased LBP LE axial distraction, bilat, decreased LBP   PATIENT EDUCATION:  Education details: Eval findings, POC, HEP Person educated: Patient Education method: Explanation, Demonstration, Tactile cues, Verbal cues, and Handouts Education comprehension: verbalized understanding, returned demonstration, verbal cues required, and tactile cues required     HOME EXERCISE PROGRAM: Access Code: [email or web address]  URL:  [email or web address] Date: 11/27/2021 Prepared by: Gar Ponto  Exercises:  Seated Flexion Stretch with Swiss Ball - 1 x daily - 7 x weekly - 1 sets - 10 reps - 10 hold Supine Piriformis Stretch with Foot on Ground - 1 x daily - 7 x weekly - 3 sets - 10 reps - 15 hold Seated Hamstring Stretch - 1 x daily - 7 x weekly - 1 sets - 3 reps - 15 hold Supine Lower Trunk Rotation - 2 x daily - 7 x weekly - 2 sets - 10 reps - 10 hold Supine Transversus Abdominis Bracing - Hands on Stomach - 1 x daily - 7 x weekly - 2 sets - 10 reps - 30 hold Supine Bridge - 2 x daily - 7 x weekly - 3 sets - 15 reps Supine March - 2 x daily - 7 x weekly - 3 sets - 15 reps added UT rotation Row with green band        ASSESSMENT:   CLINICAL IMPRESSION: With today's session pt had to assist her R leg to cross the L to attempt a piriformis stretch,  she experienced relief of R hip pain c LAD which returned when the LAD was released, scour test of the R hip was positive, IR,ER, and flexion PROM of both hips is decreased, R more so than L and painful with these movement. Pt indicates the pain is deep in her R hip. Pt's R hip appears inter-articular in nature. In addition to LADS, therex was completed for core strengthening which the pt tolerated. Will continue PT next week, however if pt's status remains the same will recommend seeing Dr. Ninfa Linden for further assessment.  IMPAIRMENTS:  Decreased ROM, decreased strength, increased muscle spasms, obesity, and pain   REHAB POTENTIAL: Good   CLINICAL DECISION MAKING: Stable/uncomplicated   EVALUATION COMPLEXITY: Low     GOALS:   SHORT TERM GOALS= LTGS   LONG TERM GOALS:    LTG Name Target Date Goal status  1 Pt will be Ind in a HEP to maintain achieved LOF Baseline:started on eval 01/01/22 ongoing  2 Pt will voice understanding of measures to decrease and manage her pain. Hot showers, tytelnol Baseline: 01/01/22 MET  3 Increase core strength to  improve trunk stability and minimize strain with work related activites Baseline: 01/01/22 Ongoing   4 Pt will demonstrated proper body mechanics for bending, stooping, kneeling and lifting Baseline: 01/01/22 Needs cues   5 Improve FOTO functional status score to 64% lumbar and 65% hips Baseline: 56% lumbar, 52% hips 01/01/22 INITIAL    PLAN: PT FREQUENCY: 2x/week   PT DURATION: 6 weeks   PLANNED INTERVENTIONS: Therapeutic exercises, Therapeutic activity, Neuro Muscular re-education, Balance training, Gait training, Patient/Family education, Joint mobilization, Stair training, Aquatic Therapy, Dry Needling, Electrical stimulation, Cryotherapy, Moist heat, Taping, Traction, Ultrasound, Ionotophoresis 16m/ml Dexamethasone, and Manual therapy   PLAN FOR NEXT SESSION:  Reinforce good body mechanics and posture with work  Cont core and standing as tolerated.  Use modalities as needed  next time if pain continues to be at this level.   Tianne Plott MS, PT 12/12/21 8:49 AM

## 2021-12-10 NOTE — Progress Notes (Signed)
? ? ?REASON FOR VISIT:  ? ?Follow-up of left vertebral artery dissection. ? ?MEDICAL ISSUES:  ? ?HISTORY OF LEFT VERTEBRAL ARTERY DISSECTION: The patient is doing well and has minimal neck pain and no significant headaches.  She has no other significant weakness or paresthesias.  I have ordered a follow-up CT angiogram of the neck in late July or early August of this year which will be 6 months from the initial event.  I will see her back at that time.  If this looks good I think we could stop her DOAC at that time and start her on baby aspirin. ? ? ? ?HPI:  ? ?Elaine Burch is a pleasant 56 y.o. female who I saw in consultation on 10/26/2021 with a left vertebral artery dissection.  The patient was having sex with her husband at 12:30 AM on the morning of admission and developed the sudden onset of a frontal headache neck pain and some leg weakness.  Her work-up included a CT angiogram of the neck which showed a possible dissection in the V3 segment of the left vertebral artery.  For this reason vascular surgery was consulted.  I also ordered a carotid duplex scan at that time to have as a baseline.  Her carotid duplex scan showed no evidence of carotid disease and patent vertebral arteries with antegrade flow.  She was treated with anticoagulation and was to come back for a 6-monthfollow-up visit with a CT angiogram of the neck.  If that study look good I was going to stop her DOAC. ? ?She was referred today by her primary care physician for continued follow-up.  She states that her neck pain has improved significantly.  It is now a 1-2 on a scale of 10.  She had no focal weakness or paresthesias.  She is had no significant headaches.  She remains on Eliquis. ? ?Past Medical History:  ?Diagnosis Date  ? Arthritis   ? beginning  ? Diabetes mellitus without complication ([place]   ? GERD (gastroesophageal reflux disease)   ? past hx of GERD  ? Hyperlipidemia   ? ? ?Family History  ?Problem Relation Age of  Onset  ? Diabetes Mother   ? Cancer Father   ? Diabetes Brother   ? Breast cancer Neg Hx   ? Colon cancer Neg Hx   ? Colon polyps Neg Hx   ? Esophageal cancer Neg Hx   ? Rectal cancer Neg Hx   ? Stomach cancer Neg Hx   ? ? ?SOCIAL HISTORY: ?Social History  ? ?Tobacco Use  ? Smoking status: Never  ? Smokeless tobacco: Never  ?Substance Use Topics  ? Alcohol use: No  ? ? ?No Known Allergies ? ?Current Outpatient Medications  ?Medication Sig Dispense Refill  ? acetaminophen (TYLENOL) 500 MG tablet Take 500 mg by mouth every 6 (six) hours as needed for moderate pain or headache.    ? apixaban (ELIQUIS) 5 MG TABS tablet Take 1 tablet (5 mg total) by mouth 2 (two) times daily. 60 tablet 0  ? Blood Glucose Monitoring Suppl (TRUE METRIX METER) w/Device KIT Use to check blood sugars 2-3 times per day 1 kit 0  ? glipiZIDE (GLUCOTROL) 5 MG tablet Take 1 tablet (5 mg total) by mouth daily before breakfast. 30 tablet 0  ? glucose blood (TRUE METRIX BLOOD GLUCOSE TEST) test strip USE AS INSTRUCTED TO CHECK BLOOD SUGAR 2-3 TIMES (Patient taking differently: in the morning and at bedtime.) 100 strip 9  ?  tiZANidine (ZANAFLEX) 2 MG tablet Take 1 tablet (2 mg total) by mouth 3 (three) times daily as needed for muscle spasms. 60 tablet 0  ? TRUEplus Lancets 28G MISC USE TO CHECK BLOOD SUGAR 2-3 TIMES DAILY. (Patient taking differently: in the morning and at bedtime.) 100 each 11  ? atorvastatin (LIPITOR) 20 MG tablet Take 1 tablet (20 mg total) by mouth daily. 30 tablet 0  ? metFORMIN (GLUCOPHAGE) 500 MG tablet Take 2 tablets (1,000 mg total) by mouth 2 (two) times daily with a meal. 120 tablet 0  ? ?No current facility-administered medications for this visit.  ? ? ?REVIEW OF SYSTEMS:  ?'[X]'  denotes positive finding, '[ ]'  denotes negative finding ?Cardiac  Comments:  ?Chest pain or chest pressure:    ?Shortness of breath upon exertion:    ?Short of breath when lying flat:    ?Irregular heart rhythm:    ?    ?Vascular    ?Pain in calf,  thigh, or hip brought on by ambulation:    ?Pain in feet at night that wakes you up from your sleep:     ?Blood clot in your veins:    ?Leg swelling:     ?    ?Pulmonary    ?Oxygen at home:    ?Productive cough:     ?Wheezing:     ?    ?Neurologic    ?Sudden weakness in arms or legs:     ?Sudden numbness in arms or legs:     ?Sudden onset of difficulty speaking or slurred speech:    ?Temporary loss of vision in one eye:     ?Problems with dizziness:     ?    ?Gastrointestinal    ?Blood in stool:     ?Vomited blood:     ?    ?Genitourinary    ?Burning when urinating:     ?Blood in urine:    ?    ?Psychiatric    ?Major depression:     ?    ?Hematologic    ?Bleeding problems:    ?Problems with blood clotting too easily:    ?    ?Skin    ?Rashes or ulcers:    ?    ?Constitutional    ?Fever or chills:    ? ?PHYSICAL EXAM:  ? ?Vitals:  ? 12/10/21 1308  ?BP: 111/65  ?Pulse: 69  ?Resp: 20  ?Temp: 97.9 ?F (36.6 ?C)  ?SpO2: 95%  ?Weight: 181 lb (82.1 kg)  ?Height: '5\' 1"'  (1.549 m)  ? ? ?GENERAL: The patient is a well-nourished female, in no acute distress. The vital signs are documented above. ?CARDIAC: There is a regular rate and rhythm.  ?VASCULAR: I do not detect carotid bruits. ?PULMONARY: There is good air exchange bilaterally without wheezing or rales. ?ABDOMEN: Soft and non-tender with normal pitched bowel sounds.  ?MUSCULOSKELETAL: There are no major deformities or cyanosis. ?NEUROLOGIC: No focal weakness or paresthesias are detected. ?SKIN: There are no ulcers or rashes noted. ?PSYCHIATRIC: The patient has a normal affect. ? ?DATA:   ? ?No new data. ? ?Deitra Mayo ?[facility] ?Office [phone] ?

## 2021-12-11 ENCOUNTER — Ambulatory Visit: Payer: Medicaid Other

## 2021-12-11 DIAGNOSIS — R252 Cramp and spasm: Secondary | ICD-10-CM

## 2021-12-11 DIAGNOSIS — M545 Low back pain, unspecified: Secondary | ICD-10-CM

## 2021-12-11 DIAGNOSIS — M6281 Muscle weakness (generalized): Secondary | ICD-10-CM

## 2021-12-11 DIAGNOSIS — G8929 Other chronic pain: Secondary | ICD-10-CM

## 2021-12-14 ENCOUNTER — Ambulatory Visit: Payer: Medicaid Other | Admitting: Physical Therapy

## 2021-12-14 ENCOUNTER — Other Ambulatory Visit: Payer: Self-pay

## 2021-12-14 DIAGNOSIS — G8929 Other chronic pain: Secondary | ICD-10-CM

## 2021-12-14 DIAGNOSIS — R252 Cramp and spasm: Secondary | ICD-10-CM

## 2021-12-14 DIAGNOSIS — M545 Low back pain, unspecified: Secondary | ICD-10-CM | POA: Diagnosis not present

## 2021-12-14 DIAGNOSIS — M6281 Muscle weakness (generalized): Secondary | ICD-10-CM

## 2021-12-14 NOTE — Therapy (Signed)
OUTPATIENT PHYSICAL THERAPY TREATMENT NOTE   Patient Name: Elaine Burch MRN: [id number] DOB:1966/09/17, 56 y.o., female Today's Date: 12/14/2021  PCP: Camillia Herter, NP REFERRING PROVIDER: Camillia Herter, NP   PT End of Session - 12/14/21 1631     Visit Number 8    Number of Visits 13    Date for PT Re-Evaluation 01/01/22    Authorization Type Self pay    Authorization Time Period Reassess FOTO at the 6th and 12th visits    PT Start Time 1550    PT Stop Time 1628    PT Time Calculation (min) 38 min    Activity Tolerance Patient tolerated treatment well;Patient limited by pain    Behavior During Therapy [facility] for tasks assessed/performed                   Past Medical History:  Diagnosis Date   Arthritis    beginning   Diabetes mellitus without complication ([place])    GERD (gastroesophageal reflux disease)    past hx of GERD   Hyperlipidemia    Past Surgical History:  Procedure Laterality Date   NO PAST SURGERIES     Patient Active Problem List   Diagnosis Date Noted   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 05/26/2021   Impingement syndrome of right shoulder 05/26/2021   Type 2 diabetes mellitus without complication, without long-term current use of insulin ([place]) 02/24/2021   Hyperlipidemia 02/24/2021   Muscle spasm of back 11/24/2013    REFERRING DIAG: M54.6 (ICD-10-CM) - Pain in thoracic spine M54.50,G89.29 (ICD-10-CM) - Chronic midline low back pain without sciatica M70.62 (ICD-10-CM) - Trochanteric bursitis, left hip M70.61 (ICD-10-CM) - Trochanteric bursitis, right hip   THERAPY DIAG:  Chronic bilateral low back pain without sciatica  Cramp and spasm  Muscle weakness (generalized)  PERTINENT HISTORY: Arthritis, Obesity, DM2  PRECAUTIONS: none  SUBJECTIVE:  She is a little worse.  Pt. Would like to hold PT until she sees the doctor.     PAIN:  Are you having pain?  Yes NPRS scale: 4 /10 Pain location: LB, waist, lateral pelvis Pain orientation: Bilateral  PAIN TYPE: Constant Pain description: aching  Aggravating factors: pushing, moving , bending and lifting  Relieving factors: rest, sitting   OBJECTIVE:    DIAGNOSTIC FINDINGS:    LUMBAR SPINE - Xray 04/08/21 IMPRESSION: Mild degenerative changes.   MRI Thoracic spine 05/05/21 MPRESSION: 1. No acute osseous abnormality of the thoracic spine. 2. Tiny shallow noncompressive disc protrusion at T8-9. Otherwise, no significant degenerative disc disease. No evidence of canal or foraminal stenosis at any level. 3. Cholelithiasis.   PATIENT SURVEYS:  FOTO 56% Lumbar; 52% hips   SCREENING FOR RED FLAGS: Bowel or bladder incontinence: No     COGNITION:          Overall cognitive status: Within functional limits for tasks assessed                        SENSATION:          Light touch: Appears intact             MUSCLE LENGTH: Hamstrings: Right WNLs; Left WFLs Thomas test: Right Tight; Left tight   POSTURE:  Forward head, increased lordosis   PALPATION: TTP of lumbar paraspinals L>R with increased muscle tension   LUMBARAROM/PROM   A/PROM A/PROM  11/13/2021  Flexion Full, tightness of LB  and post legs  Extension Full, sharp pain in low back  Right lateral flexion Min limited, tightness l LB  Left lateral flexion Min limited, tightness of the R LB  Right rotation Full, no provocation  Left rotation Full, no provocation   (Blank rows = not tested)   LE AROM/PROM: Grossly WNLs   LE MMT:   MMT Right 11/13/2021 Left 11/13/2021  Hip flexion 4+ 4+  Hip extension 4+ 4+  Hip abduction 5 5  Hip adduction 5 5  Hip internal rotation 5 5  Hip external rotation 5 5  Knee flexion 5 5  Knee extension 5 5                      Core weakness     LUMBAR SPECIAL TESTS:  Straight leg raise test: Negative, Slump test: Negative, and SI Compression/distraction test: Negative   FUNCTIONAL  TESTS:  5 times sit to stand: 19 sec  2 minute walk test: 442 feet, no increased pain    GAIT: Distance walked: 200 ft in clinic Assistive device utilized: None Level of assistance: Complete Independence Comments: Gait pattern WNLs  TODAY'S TREATMENT:  [facility] Adult PT Treatment:                                                DATE: 12/14/21 Therapeutic Exercise: Supine 90/90 isometric abs with hands on thighs  Supine 90/90 oblique twist small ROM "Heel slides" with ball x 10  Mini bridge x 10 (more of a hover) max cues for breathing for muscle support  Anterior and post pelvic tilt with breathing and abs  Knees crossed with LTR x 5 each side  Hamstring and ITB with strap  Self Care: 90/90 positioning to reduce back pain at home  Plan to HOLD Ice or Heating pad if she has one   [facility] Adult PT Treatment:                                                DATE: 12/11/21 Therapeutic Exercise: Abd bracing x10, 3 sec Marching with abd bracing 2x10 Hip stretching:piriformis 2x20" L; Not completed R, pt had pain with attempting to cross R LE over L. Manual Therapy: LAD B LEs. Relief of L hip/low back pain. With LAD of  R hip pain, pain of the hip is decreased, but returns when LAD is released  [facility] Adult PT Treatment:                                                DATE: 12/07/21 Therapeutic Exercise: Nustep L6, UE and LE for 5 min  Trial of quadruped -childs pose too painful  Hip stretching : knee crossed push and pull for hip ROM each side  Hamstring with strap x 30 sec  ITB strap 30 sec  Standing hang" at the bar knees bent x 5 added lateral bias x 1 each side  Standing at [employer] x 10 and shoulder ext x 10 for core  Manual Therapy: LAD each LE x 3-5 reps Self care: FOTO results,  POC, core   [facility] Adult PT Treatment:                                                DATE: 12/04/21 Therapeutic Exercise: Sit to stand x 10  Supine core work, isometric x 10 Supine core march x  15 Bridging 2x10  SL clams 2x10 SL hip abd 2x10 Manual Therapy: UPAs of lubar spine, increased LBP LE axial distraction, bilat, decreased LBP   PATIENT EDUCATION:  Education details: Eval findings, POC, HEP Person educated: Patient Education method: Explanation, Demonstration, Tactile cues, Verbal cues, and Handouts Education comprehension: verbalized understanding, returned demonstration, verbal cues required, and tactile cues required     HOME EXERCISE PROGRAM: Access Code: [email or web address] URL: [email or web address] Date: 11/27/2021 Prepared by: Gar Ponto  Exercises:  Seated Flexion Stretch with Swiss Ball - 1 x daily - 7 x weekly - 1 sets - 10 reps - 10 hold Supine Piriformis Stretch with Foot on Ground - 1 x daily - 7 x weekly - 3 sets - 10 reps - 15 hold Seated Hamstring Stretch - 1 x daily - 7 x weekly - 1 sets - 3 reps - 15 hold Supine Lower Trunk Rotation - 2 x daily - 7 x weekly - 2 sets - 10 reps - 10 hold Supine Transversus Abdominis Bracing - Hands on Stomach - 1 x daily - 7 x weekly - 2 sets - 10 reps - 30 hold Supine Bridge - 2 x daily - 7 x weekly - 3 sets - 15 reps Supine March - 2 x daily - 7 x weekly - 3 sets - 15 reps added UT rotation Row with green band        ASSESSMENT:   CLINICAL IMPRESSION: Patient will be placed on hold from PT as she is feeling an increase in her symptoms.  She would like to see what the doctor can offer her in lieu of or in addition to therapy.  She has pain in bilateral hips (ant/post/lateral) and low back .  I recommended she continue to have good body mechanics at work, maybe take a couple of days off from the HEP.  She will reach back out to use when she has seen the MD for further interventions or recommendations.    IMPAIRMENTS:  Decreased ROM, decreased strength, increased muscle spasms, obesity, and pain   REHAB POTENTIAL: Good   CLINICAL DECISION MAKING: Stable/uncomplicated   EVALUATION COMPLEXITY: Low      GOALS:   SHORT TERM GOALS= LTGS   LONG TERM GOALS:    LTG Name Target Date Goal status  1 Pt will be Ind in a HEP to maintain achieved LOF Baseline:started on eval 01/01/22 ongoing  2 Pt will voice understanding of measures to decrease and manage her pain. Hot showers, tytelnol Baseline: 01/01/22 MET  3 Increase core strength to improve trunk stability and minimize strain with work related activites Baseline: 01/01/22 Ongoing   4 Pt will demonstrated proper body mechanics for bending, stooping, kneeling and lifting Baseline: 01/01/22 Needs cues   5 Improve FOTO functional status score to 64% lumbar and 65% hips Baseline: 56% lumbar, 52% hips 01/01/22 INITIAL    PLAN: PT FREQUENCY: 2x/week   PT DURATION: 6 weeks   PLANNED INTERVENTIONS: Therapeutic exercises, Therapeutic activity, Neuro Muscular re-education, Balance training, Gait training, Patient/Family education, Joint mobilization, Stair  training, Aquatic Therapy, Dry Needling, Electrical stimulation, Cryotherapy, Moist heat, Taping, Traction, Ultrasound, Ionotophoresis 83m/ml Dexamethasone, and Manual therapy   PLAN FOR NEXT SESSION:  What did MD say? PT hold until 3/27  JRaeford Razor PT 12/14/21 4:31 PM Phone: [phone]: [phone]

## 2021-12-15 ENCOUNTER — Other Ambulatory Visit: Payer: Self-pay

## 2021-12-15 ENCOUNTER — Other Ambulatory Visit: Payer: Self-pay | Admitting: Family

## 2021-12-15 DIAGNOSIS — E119 Type 2 diabetes mellitus without complications: Secondary | ICD-10-CM

## 2021-12-15 MED ORDER — METFORMIN HCL 500 MG PO TABS
1000.0000 mg | ORAL_TABLET | Freq: Two times a day (BID) | ORAL | 0 refills | Status: DC
  Filled 2021-12-15 (×2): qty 120, 30d supply, fill #0
  Filled 2022-02-11: qty 120, 30d supply, fill #1
  Filled 2022-03-15: qty 120, 30d supply, fill #2

## 2021-12-15 MED ORDER — GLIPIZIDE 5 MG PO TABS
5.0000 mg | ORAL_TABLET | Freq: Every day | ORAL | 0 refills | Status: DC
  Filled 2021-12-15: qty 30, 30d supply, fill #0
  Filled 2022-02-11: qty 30, 30d supply, fill #1
  Filled 2022-03-15: qty 30, 30d supply, fill #2

## 2021-12-15 NOTE — Telephone Encounter (Signed)
Schedule appointment for additional refills.  ?

## 2021-12-15 NOTE — Telephone Encounter (Signed)
Glipizide and Metformin refilled as requested. Schedule appointment for additional refills.

## 2021-12-16 ENCOUNTER — Other Ambulatory Visit: Payer: Self-pay

## 2021-12-18 ENCOUNTER — Ambulatory Visit: Payer: Medicaid Other

## 2021-12-21 ENCOUNTER — Ambulatory Visit: Payer: Self-pay | Admitting: Orthopaedic Surgery

## 2021-12-22 ENCOUNTER — Ambulatory Visit: Payer: Medicaid Other

## 2021-12-25 ENCOUNTER — Ambulatory Visit: Payer: Medicaid Other

## 2021-12-27 NOTE — Therapy (Signed)
?OUTPATIENT PHYSICAL THERAPY TREATMENT NOTE ?DISCHARGE ? ? ?Patient Name: Elaine Burch ?MRN: [id number] ?DOB:[birth year], 56 y.o., female ?Today's Date: 12/28/2021 ? ?PCP: Camillia Herter, NP ?REFERRING PROVIDER: Mcarthur Rossetti* ? ? PT End of Session - 12/28/21 1551   ? ? Visit Number 9   ? Number of Visits 13   ? Date for PT Re-Evaluation 01/01/22   ? Authorization Type Self pay   ? Authorization Time Period Reassess FOTO at the 6th and 12th visits   ? PT Start Time 1546   ? PT Stop Time 1622   ? PT Time Calculation (min) 36 min   ? Activity Tolerance Patient tolerated treatment well   ? Behavior During Therapy [facility] for tasks assessed/performed   ? ?  ?  ? ?  ? ? ? ? ? ? ? ? ? ?Past Medical History:  ?Diagnosis Date  ? Arthritis   ? beginning  ? Diabetes mellitus without complication ([place])   ? GERD (gastroesophageal reflux disease)   ? past hx of GERD  ? Hyperlipidemia   ? ?Past Surgical History:  ?Procedure Laterality Date  ? NO PAST SURGERIES    ? ?Patient Active Problem List  ? Diagnosis Date Noted  ? 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 05/26/2021  ? Impingement syndrome of right shoulder 05/26/2021  ? Type 2 diabetes mellitus without complication, without long-term current use of insulin ([place]) 02/24/2021  ? Hyperlipidemia 02/24/2021  ? Muscle spasm of back 11/24/2013  ? ? ?REFERRING DIAG: M54.6 (ICD-10-CM) - Pain in thoracic spine M54.50,G89.29 (ICD-10-CM) - Chronic midline low back pain without sciatica M70.62 (ICD-10-CM) - Trochanteric bursitis, left hip M70.61 (ICD-10-CM) - Trochanteric bursitis, right hip  ? ?THERAPY DIAG:  ?Chronic bilateral low back pain without sciatica ? ?Cramp and spasm ? ?Muscle weakness (generalized) ? ?PERTINENT HISTORY: Arthritis, Obesity, DM2 ? ?PRECAUTIONS: none ? ?SUBJECTIVE:  ?Patient saw the MD this AM.  He said I could suspend therapy because it is not helping.  The pain has been about the same in  the past 2 weeks.  She no longer feels weak in her legs.   ? ?PAIN:  ?Are you having pain? Yes ?NPRS scale: 3 /10 ?Pain location: LB, waist, lateral pelvis ?Pain orientation: Bilateral  ?PAIN TYPE: Constant ?Pain description: aching  ?Aggravating factors: pushing, moving , bending and lifting  ?Relieving factors: rest, sitting  ? ?OBJECTIVE:  ?  ?DIAGNOSTIC FINDINGS:  ?  ?LUMBAR SPINE - Xray 04/08/21 ?IMPRESSION: ?Mild degenerative changes. ?  ?MRI Thoracic spine 05/05/21 ?MPRESSION: ?1. No acute osseous abnormality of the thoracic spine. ?2. Tiny shallow noncompressive disc protrusion at T8-9. Otherwise, ?no significant degenerative disc disease. No evidence of canal or ?foraminal stenosis at any level. ?3. Cholelithiasis. ?  ?PATIENT SURVEYS:  ?FOTO 56% Lumbar; 52% hips ? 12/28/21:  Lumbar  Hips  ?SCREENING FOR RED FLAGS: ?Bowel or bladder incontinence: No ?  ?  ?COGNITION: ?         Overall cognitive status: Within functional limits for tasks assessed              ?          ?SENSATION: ?         Light touch: Appears intact ?          ?  ?MUSCLE LENGTH: ?Hamstrings: Right WNLs; Left WFLs ?Thomas test: Right Tight; Left tight ?  ?POSTURE:  ?Forward head, increased lordosis ?  ?  PALPATION: ?TTP of lumbar paraspinals L>R with increased muscle tension ?  ?LUMBARAROM/PROM ?  ?A/PROM A/PROM  ?11/13/2021  ?Flexion Full, tightness of LB and post legs  ?Extension Full, sharp pain in low back  ?Right lateral flexion Min limited, tightness l LB  ?Left lateral flexion Min limited, tightness of the R LB  ?Right rotation Full, no provocation  ?Left rotation Full, no provocation  ? (Blank rows = not tested) ?  ?LE AROM/PROM: ?Grossly WNLs ?  ?LE MMT: ?  ?MMT Right ?11/13/2021 Left ?11/13/2021  ?Hip flexion 4+ 4+  ?Hip extension 4+ 4+  ?Hip abduction 5 5  ?Hip adduction 5 5  ?Hip internal rotation 5 5  ?Hip external rotation 5 5  ?Knee flexion 5 5  ?Knee extension 5 5  ?                    Core weakness ?  ?  ?LUMBAR SPECIAL TESTS:   ?Straight leg raise test: Negative, Slump test: Negative, and SI Compression/distraction test: Negative ?  ?FUNCTIONAL TESTS:  ?5 times sit to stand: 19 sec  ?2 minute walk test: 442 feet, no increased pain  ?  ?GAIT: ?Distance walked: 200 ft in clinic ?Assistive device utilized: None ?Level of assistance: Complete Independence ?Comments: Gait pattern WNLs ? ?TODAY'S TREATMENT: ? ? ?[facility] Adult PT Treatment:                                                DATE: 12/28/21 ?Therapeutic Exercise: ?Supine Piriformis Stretch with Foot on Ground  ?Anterior and post pelvic tilt with breathing and abs x 10  ?Supine March x 10 ?Supine Bridge x 10  ?Lower trunk rotation x 10  ?Lower abd 90/90 toe taps x 10 with cues , uses hands under pelvis to ease low back strain   ? ?Self Care: ?DC vs continue PT, goals. Pain , HEP , core, verbal lifting and demo  ? ? ?[facility] Adult PT Treatment:                                                DATE: 12/14/21 ?Therapeutic Exercise: ?Supine 90/90 isometric abs with hands on thighs  ?Supine 90/90 oblique twist small ROM ?"Heel slides" with ball x 10  ?Mini bridge x 10 (more of a hover) max cues for breathing for muscle support  ?Anterior and post pelvic tilt with breathing and abs  ?Knees crossed with LTR x 5 each side  ?Hamstring and ITB with strap ? ?Self Care: ?90/90 positioning to reduce back pain at home  ?Plan to HOLD ?Ice or Heating pad if she has one  ? ?[facility] Adult PT Treatment:                                                DATE: 12/11/21 ?Therapeutic Exercise: ?Abd bracing x10, 3 sec ?Marching with abd bracing 2x10 ?Hip stretching:piriformis 2x20" L; Not completed R, pt had pain with attempting to cross R LE over L. ?Manual Therapy: ?LAD B LEs. Relief of L hip/low back pain. With LAD of  R hip pain, pain of the hip is decreased, but returns when LAD is released ? ?[facility] Adult PT Treatment:                                                DATE: 12/07/21 ?Therapeutic Exercise: ?Nustep L6, UE and LE  for 5 min  ?Trial of quadruped -childs pose too painful  ?Hip stretching : knee crossed push and pull for hip ROM each side  ?Hamstring with strap x 30 sec  ?ITB strap 30 sec  ?Standing hang" at the bar knees bent x 5 added lateral bias x 1 each side  ?Standing at [employer] x 10 and shoulder ext x 10 for core  ?Manual Therapy: ?LAD each LE x 3-5 reps ?Self care: FOTO results, POC, core  ? ?PATIENT EDUCATION:  ?Education details: Eval findings, POC, HEP ?Person educated: Patient ?Education method: Explanation, Demonstration, Tactile cues, Verbal cues, and Handouts ?Education comprehension: verbalized understanding, returned demonstration, verbal cues required, and tactile cues required ?  ?  ?HOME EXERCISE PROGRAM: ?Access Code: [email or web address] ?URL: [email or web address] ?Date: 11/27/2021 ?Prepared by: Gar Ponto ? ?Exercises: ? ?Seated Flexion Stretch with Swiss Ball - 1 x daily - 7 x weekly - 1 sets - 10 reps - 10 hold ?- 1 x daily - 7 x weekly - 3 sets - 10 reps - 15 hold ?Seated Hamstring Stretch - 1 x daily - 7 x weekly - 1 sets - 3 reps - 15 hold ?Supine Lower Trunk Rotation - 2 x daily - 7 x weekly - 2 sets - 10 reps - 10 hold ?Supine Transversus Abdominis Bracing - Hands on Stomach - 1 x daily - 7 x weekly - 2 sets - 10 reps - 30 hold ? - 2 x daily - 7 x weekly - 3 sets - 15 reps ?Supine March - 2 x daily - 7 x weekly - 3 sets - 15 reps ?added UT rotation ?Row with green band  ? 90/90 toe tap ?  ?  ?ASSESSMENT: ?  ?CLINICAL IMPRESSION: ?Patient agreeable to discharge at this time.  Her FOTO score has not improved.  She seems to be fairly comfortable today and did her HEP with min cues.  Goals partially met.  ? ? ?IMPAIRMENTS:  ?Decreased ROM, decreased strength, increased muscle spasms, obesity, and pain ?  ?REHAB POTENTIAL: Good ?  ?CLINICAL DECISION MAKING: Stable/uncomplicated ?  ?EVALUATION COMPLEXITY: Low ?  ?  ?GOALS: ?  ?SHORT TERM GOALS= LTGS ?  ?LONG TERM GOALS:  ?  ?LTG Name Target  Date Goal status  ?1 Pt will be Ind in a HEP to maintain achieved LOF ?Baseline:started on eval 01/01/22 ongoing  ?2 Pt will voice understanding of measures to decrease and manage her pain. Hot showers, tyt

## 2021-12-28 ENCOUNTER — Ambulatory Visit (INDEPENDENT_AMBULATORY_CARE_PROVIDER_SITE_OTHER): Payer: Self-pay | Admitting: Orthopaedic Surgery

## 2021-12-28 ENCOUNTER — Ambulatory Visit: Payer: Medicaid Other | Admitting: Physical Therapy

## 2021-12-28 ENCOUNTER — Encounter: Payer: Self-pay | Admitting: Physical Therapy

## 2021-12-28 ENCOUNTER — Other Ambulatory Visit: Payer: Self-pay | Admitting: Family

## 2021-12-28 ENCOUNTER — Ambulatory Visit: Payer: Self-pay | Admitting: Orthopaedic Surgery

## 2021-12-28 ENCOUNTER — Other Ambulatory Visit: Payer: Self-pay

## 2021-12-28 ENCOUNTER — Encounter: Payer: Self-pay | Admitting: Orthopaedic Surgery

## 2021-12-28 DIAGNOSIS — I7774 Dissection of vertebral artery: Secondary | ICD-10-CM

## 2021-12-28 DIAGNOSIS — M6281 Muscle weakness (generalized): Secondary | ICD-10-CM

## 2021-12-28 DIAGNOSIS — R252 Cramp and spasm: Secondary | ICD-10-CM

## 2021-12-28 DIAGNOSIS — M545 Low back pain, unspecified: Secondary | ICD-10-CM | POA: Diagnosis not present

## 2021-12-28 DIAGNOSIS — M546 Pain in thoracic spine: Secondary | ICD-10-CM

## 2021-12-28 DIAGNOSIS — M5442 Lumbago with sciatica, left side: Secondary | ICD-10-CM

## 2021-12-28 DIAGNOSIS — G8929 Other chronic pain: Secondary | ICD-10-CM

## 2021-12-28 DIAGNOSIS — M5441 Lumbago with sciatica, right side: Secondary | ICD-10-CM

## 2021-12-28 MED ORDER — TIZANIDINE HCL 4 MG PO TABS
4.0000 mg | ORAL_TABLET | Freq: Two times a day (BID) | ORAL | 1 refills | Status: DC | PRN
  Filled 2021-12-28: qty 60, 30d supply, fill #0

## 2021-12-28 NOTE — Progress Notes (Signed)
The patient comes in for follow-up as a relates to upper and lower back pain.  When I saw her back in January we recommended physical therapy.  She is in the [facility] discount system and finally did have some therapy but she says some of the therapy does make her back worse.  She did not really think therapy helped her at all.  In the interim since I saw her she did have some type of vertebral artery dissection and she is on Eliquis now.  She cannot take anti-inflammatories.  She denies any radicular symptoms and most of her pain seems to be isolated to the upper and lower thoracic and lumbar spine. ? ?She does have good mobility of her spine but does hurt with certain activities especially the lower lumbar spine.  She understands that right now we would not recommend any type of intervention such as injections given the fact that she is on Eliquis and cannot come off of that for any type of intervention.  We will try Zanaflex as a muscle relaxant and she will work on intermittent ice and heat and activity modification.  We can always reevaluate her in 4 months to determine at that point whether or not a MRI of the lumbar spine would be warranted. ?

## 2021-12-29 ENCOUNTER — Other Ambulatory Visit: Payer: Self-pay | Admitting: Family

## 2021-12-29 ENCOUNTER — Other Ambulatory Visit: Payer: Self-pay

## 2021-12-29 DIAGNOSIS — I7774 Dissection of vertebral artery: Secondary | ICD-10-CM

## 2021-12-30 ENCOUNTER — Telehealth: Payer: Self-pay | Admitting: Family

## 2021-12-30 ENCOUNTER — Other Ambulatory Visit: Payer: Self-pay

## 2021-12-30 MED ORDER — APIXABAN 5 MG PO TABS
5.0000 mg | ORAL_TABLET | Freq: Two times a day (BID) | ORAL | 0 refills | Status: DC
  Filled 2021-12-30: qty 60, 30d supply, fill #0

## 2021-12-30 NOTE — Telephone Encounter (Signed)
Apixaban (Eliquis) refilled per patient request.  ?

## 2021-12-30 NOTE — Telephone Encounter (Signed)
Apixaban (Eliquis) refilled per patient request.

## 2022-01-01 ENCOUNTER — Ambulatory Visit: Payer: Medicaid Other

## 2022-01-04 ENCOUNTER — Other Ambulatory Visit: Payer: Self-pay

## 2022-01-05 ENCOUNTER — Other Ambulatory Visit: Payer: Self-pay

## 2022-01-11 ENCOUNTER — Other Ambulatory Visit: Payer: Self-pay

## 2022-02-11 ENCOUNTER — Other Ambulatory Visit: Payer: Self-pay | Admitting: Family

## 2022-02-11 ENCOUNTER — Other Ambulatory Visit: Payer: Self-pay

## 2022-02-11 DIAGNOSIS — E782 Mixed hyperlipidemia: Secondary | ICD-10-CM

## 2022-02-11 DIAGNOSIS — E119 Type 2 diabetes mellitus without complications: Secondary | ICD-10-CM

## 2022-02-11 MED ORDER — ATORVASTATIN CALCIUM 20 MG PO TABS
20.0000 mg | ORAL_TABLET | Freq: Every day | ORAL | 0 refills | Status: DC
  Filled 2022-02-11: qty 90, 90d supply, fill #0

## 2022-02-11 NOTE — Telephone Encounter (Signed)
Atorvastatin refilled per patient request. Offer patient appointment for diabetes checkup.

## 2022-02-11 NOTE — Telephone Encounter (Signed)
Atorvastatin refilled per patient request. Offer patient appointment for diabetes checkup.  ?

## 2022-02-12 ENCOUNTER — Other Ambulatory Visit: Payer: Self-pay

## 2022-02-15 ENCOUNTER — Other Ambulatory Visit: Payer: Self-pay

## 2022-03-15 ENCOUNTER — Other Ambulatory Visit: Payer: Self-pay

## 2022-03-25 NOTE — Progress Notes (Signed)
Patient ID: Elaine Burch, female    DOB: November 27, 1965  MRN: [id number]  CC: Diabetes Follow-Up  Subjective: Elaine Burch is a 56 y.o. female who presents for diabetes follow-up.   Her concerns today include:  - Doing well on present diabetes management, no issues/concerns.  - Request eye referral.  - Request dentist referral.  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     apixaban (ELIQUIS) 5 MG TABS tablet Take 1 tablet (5 mg total) by mouth 2 (two) times daily. 60 tablet 0   atorvastatin (LIPITOR) 20 MG tablet Take 1 tablet (20 mg total) by mouth daily. 90 tablet 0   Blood Glucose Monitoring Suppl (TRUE METRIX METER) w/Device KIT Use to check blood sugars 2-3 times per day 1 kit 0   glucose blood (TRUE METRIX BLOOD GLUCOSE TEST) test strip USE AS INSTRUCTED TO CHECK BLOOD SUGAR 2-3 TIMES (Patient taking differently: in the morning and at bedtime.) 100 strip 9   tiZANidine (ZANAFLEX) 4 MG tablet Take 1 tablet (4 mg total) by mouth 2 (two) times daily as needed for muscle spasms. 60 tablet 1   No current facility-administered medications on file prior to visit.    No Known Allergies  Social History   Socioeconomic History   Marital status: Married    Spouse name: Not on file   Number of children: 4   Years of education: Not on file   Highest education level: 6th grade  Occupational History   Not on file   Tobacco Use   Smoking status: Never    Passive exposure: Never   Smokeless tobacco: Never  Vaping Use   Vaping Use: Never used  Substance and Sexual Activity   Alcohol use: No   Drug use: No   Sexual activity: Yes    Birth control/protection: None  Other Topics Concern   Not on file  Social History Narrative   Not on file   Social Determinants of Health   Financial Resource Strain: Not on file  Food Insecurity: No Food Insecurity (04/07/2021)   Hunger Vital Sign    Worried About Running Out of Food in the Last Year: Never true    Ran Out of Food in the Last Year: Never true  Transportation Needs: No Transportation Needs (04/07/2021)   PRAPARE - Hydrologist (Medical): No    Lack of Transportation (Non-Medical): No  Physical Activity: Inactive (11/07/2018)   Exercise Vital Sign    Days of Exercise per Week: 0 days    Minutes of Exercise per Session: 0 min  Stress: No Stress Concern Present (11/07/2018)   [place]    Feeling of Stress : Only a little  Social Connections: Moderately Integrated (11/07/2018)   Social Connection and Isolation Panel [NHANES]    Frequency of Communication with Friends and Family: More than three times a week  Frequency of Social Gatherings with Friends and Family: More than three times a week    Attends Religious Services: 1 to 4 times per year    Active Member of [employer] or Organizations: No    Attends Archivist Meetings: Never    Marital Status: Married  Human resources officer Violence: Not At Risk (11/07/2018)   Humiliation, Afraid, Rape, and Kick questionnaire    Fear of Current or Ex-Partner: No    Emotionally Abused: No    Physically Abused: No    Sexually Abused: No    Family History  Problem Relation Age of Onset   Diabetes Mother    Cancer Father    Diabetes Brother    Breast cancer Neg Hx    Colon cancer Neg Hx    Colon polyps Neg Hx     Esophageal cancer Neg Hx    Rectal cancer Neg Hx    Stomach cancer Neg Hx     Past Surgical History:  Procedure Laterality Date   NO PAST SURGERIES      ROS: Review of Systems Negative except as stated above  PHYSICAL EXAM: BP 133/66 (BP Location: Left Arm, Patient Position: Sitting, Cuff Size: Large)   Pulse 78   Temp 98.3 F (36.8 C)   Resp 16   Ht 5' 0.98" (1.549 m)   Wt 152 lb 8 oz (69.2 kg)   SpO2 94%   BMI 28.83 kg/m   Physical Exam HENT:     Head: Normocephalic and atraumatic.     Mouth/Throat:     Mouth: Mucous membranes are moist.     Pharynx: Oropharynx is clear.  Eyes:     Extraocular Movements: Extraocular movements intact.     Conjunctiva/sclera: Conjunctivae normal.     Pupils: Pupils are equal, round, and reactive to light.  Cardiovascular:     Rate and Rhythm: Normal rate and regular rhythm.     Pulses: Normal pulses.     Heart sounds: Normal heart sounds.  Pulmonary:     Effort: Pulmonary effort is normal.     Breath sounds: Normal breath sounds.  Musculoskeletal:     Cervical back: Normal range of motion and neck supple.  Feet:     Right foot:     Skin integrity: Skin integrity normal.     Toenail Condition: Right toenails are normal.     Left foot:     Skin integrity: Skin integrity normal.     Toenail Condition: Left toenails are normal.  Neurological:     General: No focal deficit present.     Mental Status: She is alert and oriented to person, place, and time.  Psychiatric:        Mood and Affect: Mood normal.        Behavior: Behavior normal.    Results for orders placed or performed in visit on 03/31/22  POCT glycosylated hemoglobin (Hb A1C)  Result Value Ref Range   Hemoglobin A1C 6.9 (A) 4.0 - 5.6 %   HbA1c POC (<> result, manual entry)     HbA1c, POC (prediabetic range)     HbA1c, POC (controlled diabetic range)      ASSESSMENT AND PLAN: 1. Type 2 diabetes mellitus without complication, without long-term current use  of insulin ([facility]) - Hemoglobin A1c at goal 6.9%, goal < 7%.  - Continue Glipizide and Metformin as prescribed.  - Discussed the importance of healthy eating habits, low-carbohydrate diet, low-sugar diet, regular aerobic exercise (at least 150 minutes  a week as tolerated) and medication compliance to achieve or maintain control of diabetes. - Update BMP. - Follow-up with primary provider in 4 months or sooner if needed.  - Basic Metabolic Panel - POCT glycosylated hemoglobin (Hb A1C) - glipiZIDE (GLUCOTROL) 5 MG tablet; Take 1 tablet (5 mg total) by mouth daily before breakfast.  Dispense: 30 tablet; Refill: 3 - metFORMIN (GLUCOPHAGE) 500 MG tablet; Take 2 tablets (1,000 mg total) by mouth 2 (two) times daily with a meal.  Dispense: 120 tablet; Refill: 3  2. Hyperlipidemia, unspecified hyperlipidemia type - Continue Atorvastatin as prescribed. No refills needed as of present.  - Update lipid panel.  - Follow-up as scheduled.  - Lipid panel  3. Routine eye exam - Referral to Ophthalmology for further evaluation and management.  - Ambulatory referral to Ophthalmology  4. Encounter for routine dental examination - Referral to Dentistry for further evaluation and management. - Ambulatory referral to Dentistry  5. Language barrier - Building control surveyor. Name: Minette Brine ID#: [id number]     Patient was given the opportunity to ask questions.  Patient verbalized understanding of the plan and was able to repeat key elements of the plan. Patient was given clear instructions to go to Emergency Department or return to medical center if symptoms don't improve, worsen, or new problems develop.The patient verbalized understanding.   Orders Placed This Encounter  Procedures   Basic Metabolic Panel   Lipid panel   Ambulatory referral to Ophthalmology   Ambulatory referral to Dentistry   POCT glycosylated hemoglobin (Hb A1C)     Requested Prescriptions   Signed Prescriptions Disp Refills    glipiZIDE (GLUCOTROL) 5 MG tablet 30 tablet 3    Sig: Take 1 tablet (5 mg total) by mouth daily before breakfast.   metFORMIN (GLUCOPHAGE) 500 MG tablet 120 tablet 3    Sig: Take 2 tablets (1,000 mg total) by mouth 2 (two) times daily with a meal.    Return in about 4 months (around 07/31/2022) for Follow-Up or next available chronic care mgmt.  Camillia Herter, NP

## 2022-03-31 ENCOUNTER — Other Ambulatory Visit: Payer: Self-pay

## 2022-03-31 ENCOUNTER — Encounter: Payer: Self-pay | Admitting: Family

## 2022-03-31 ENCOUNTER — Ambulatory Visit (INDEPENDENT_AMBULATORY_CARE_PROVIDER_SITE_OTHER): Payer: Medicaid Other | Admitting: Family

## 2022-03-31 VITALS — BP 133/66 | HR 78 | Temp 98.3°F | Resp 16 | Ht 60.98 in | Wt 152.5 lb

## 2022-03-31 DIAGNOSIS — E785 Hyperlipidemia, unspecified: Secondary | ICD-10-CM | POA: Diagnosis not present

## 2022-03-31 DIAGNOSIS — E119 Type 2 diabetes mellitus without complications: Secondary | ICD-10-CM | POA: Diagnosis not present

## 2022-03-31 DIAGNOSIS — Z0120 Encounter for dental examination and cleaning without abnormal findings: Secondary | ICD-10-CM

## 2022-03-31 DIAGNOSIS — Z0100 Encounter for examination of eyes and vision without abnormal findings: Secondary | ICD-10-CM

## 2022-03-31 DIAGNOSIS — Z789 Other specified health status: Secondary | ICD-10-CM | POA: Diagnosis not present

## 2022-03-31 LAB — POCT GLYCOSYLATED HEMOGLOBIN (HGB A1C): Hemoglobin A1C: 6.9 % — AB (ref 4.0–5.6)

## 2022-03-31 MED ORDER — METFORMIN HCL 500 MG PO TABS
1000.0000 mg | ORAL_TABLET | Freq: Two times a day (BID) | ORAL | 3 refills | Status: DC
  Filled 2022-03-31 – 2022-04-12 (×2): qty 120, 30d supply, fill #0
  Filled 2022-06-01: qty 120, 30d supply, fill #1
  Filled 2022-07-06: qty 120, 30d supply, fill #2

## 2022-03-31 MED ORDER — GLIPIZIDE 5 MG PO TABS
5.0000 mg | ORAL_TABLET | Freq: Every day | ORAL | 3 refills | Status: DC
  Filled 2022-03-31 – 2022-04-12 (×2): qty 30, 30d supply, fill #0
  Filled 2022-06-01: qty 30, 30d supply, fill #1
  Filled 2022-07-06: qty 30, 30d supply, fill #2

## 2022-03-31 NOTE — Progress Notes (Signed)
Pt presents for diabetes f/u, states that she is experiencing generalized body pain in shoulders, upper back, hips, and knee. Pt sees ortho Ninfa Linden, MD was prescribed Tizanidine but pt states makes her sick and unable to take.  Black spots on bottom of feet and left elbow

## 2022-03-31 NOTE — Patient Instructions (Signed)
Diabetes mellitus y nutricin, en adultos Diabetes Mellitus and Nutrition, Adult Si sufre de diabetes, o diabetes mellitus, es muy importante tener hbitos alimenticios saludables debido a que sus niveles de azcar en la sangre (glucosa) se ven afectados en gran medida por lo que come y bebe. Comer alimentos saludables en las cantidades correctas, aproximadamente a la misma hora todos los das, lo ayudar a: Controlar su glucemia. Disminuir el riesgo de sufrir una enfermedad cardaca. Mejorar la presin arterial. Alcanzar o mantener un peso saludable. Qu puede afectar mi plan de alimentacin? Todas las personas que sufren de diabetes son diferentes y cada una tiene necesidades diferentes en cuanto a un plan de alimentacin. El mdico puede recomendarle que trabaje con un nutricionista para elaborar el mejor plan para usted. Su plan de alimentacin puede variar segn factores como: Las caloras que necesita. Los medicamentos que toma. Su peso. Sus niveles de glucemia, presin arterial y colesterol. Su nivel de actividad. Otras afecciones que tenga, como enfermedades cardacas o renales. Cmo me afectan los carbohidratos? Los carbohidratos, o hidratos de carbono, afectan su nivel de glucemia ms que cualquier otro tipo de alimento. La ingesta de carbohidratos aumenta la cantidad de glucosa en la sangre. Es importante conocer la cantidad de carbohidratos que se pueden ingerir en cada comida sin correr ningn riesgo. Esto es diferente en cada persona. Su nutricionista puede ayudarlo a calcular la cantidad de carbohidratos que debe ingerir en cada comida y en cada refrigerio. Cmo me afecta el alcohol? El alcohol puede provocar una disminucin de la glucemia (hipoglucemia), especialmente si usa insulina o toma determinados medicamentos por va oral para la diabetes. La hipoglucemia es una afeccin potencialmente mortal. Los sntomas de la hipoglucemia, como somnolencia, mareos y confusin, son  similares a los sntomas de haber consumido demasiado alcohol. No beba alcohol si: Su mdico le indica no hacerlo. Est embarazada, puede estar embarazada o est tratando de quedar embarazada. Si bebe alcohol: Limite la cantidad que bebe a lo siguiente: De 0 a 1 medida por da para las mujeres. De 0 a 2 medidas por da para los hombres. Sepa cunta cantidad de alcohol hay en las bebidas que toma. En los [place], una medida equivale a una botella de cerveza de 12 oz (355 ml), un vaso de vino de 5 oz (148 ml) o un vaso de una bebida alcohlica de alta graduacin de 1 oz (44 ml). Mantngase hidratado bebiendo agua, refrescos dietticos o t helado sin azcar. Tenga en cuenta que los refrescos comunes, los jugos y otras bebidas para mezclar pueden contener mucha azcar y se deben contar como carbohidratos. Consejos para seguir este plan  Leer las etiquetas de los alimentos Comience por leer el tamao de la porcin en la etiqueta de Informacin nutricional de los alimentos envasados y las bebidas. La cantidad de caloras, carbohidratos, grasas y otros nutrientes detallados en la etiqueta se basan en una porcin del alimento. Muchos alimentos contienen ms de una porcin por envase. Verifique la cantidad total de gramos (g) de carbohidratos totales en una porcin. Verifique la cantidad de gramos de grasas saturadas y grasas trans en una porcin. Escoja alimentos que no contengan estas grasas o que su contenido de estas sea bajo. Verifique la cantidad de miligramos (mg) de sal (sodio) en una porcin. La mayora de las personas deben limitar la ingesta de sodio total a menos de 2300 mg por da. Siempre consulte la informacin nutricional de los alimentos etiquetados como "con bajo contenido de grasa" o "sin grasa".   Estos alimentos pueden tener un mayor contenido de azcar agregada o carbohidratos refinados, y deben evitarse. Hable con su nutricionista para identificar sus objetivos diarios en  cuanto a los nutrientes mencionados en la etiqueta. Al ir de compras Evite comprar alimentos procesados, enlatados o precocidos. Estos alimentos tienden a tener una mayor cantidad de grasa, sodio y azcar agregada. Compre en la zona exterior de la tienda de comestibles. Esta es la zona donde se encuentran con mayor frecuencia las frutas y las verduras frescas, los cereales a granel, las carnes frescas y los productos lcteos frescos. Al cocinar Use mtodos de coccin a baja temperatura, como hornear, en lugar de mtodos de coccin a alta temperatura, como frer en abundante aceite. Cocine con aceites saludables, como el aceite de oliva, canola o girasol. Evite cocinar con manteca, crema o carnes con alto contenido de grasa. Planificacin de las comidas Coma las comidas y los refrigerios regularmente, preferentemente a la misma hora todos los das. Evite pasar largos perodos de tiempo sin comer. Consuma alimentos ricos en fibra, como frutas frescas, verduras, frijoles y cereales integrales. Consuma entre 4 y 6 onzas (entre 112 y 168 g) de protenas magras por da, como carnes magras, pollo, pescado, huevos o tofu. Una onza (oz) (28 g) de protena magra equivale a: 1 onza (28 g) de carne, pollo o pescado. 1 huevo.  taza (62 g) de tofu. Coma algunos alimentos por da que contengan grasas saludables, como aguacates, frutos secos, semillas y pescado. Qu alimentos debo comer? Frutas Bayas. Manzanas. Naranjas. Duraznos. Damascos. Ciruelas. Uvas. Mangos. Papayas. Granadas. Kiwi. Cerezas. Verduras Verduras de hoja verde, que incluyen lechuga, espinaca, col rizada, acelga, hojas de berza, hojas de mostaza y repollo. Remolachas. Coliflor. Brcoli. Zanahorias. Judas verdes. Tomates. Pimientos. Cebollas. Pepinos. Coles de Bruselas. Granos Granos integrales, como panes, galletas, tortillas, cereales y pastas de salvado o integrales. Avena sin azcar. Quinua. Arroz integral o salvaje. Carnes y otras  protenas Frutos de mar. Carne de ave sin piel. Cortes magros de ave y carne de res. Tofu. Frutos secos. Semillas. Lcteos Productos lcteos sin grasa o con bajo contenido de grasa, como leche, yogur y queso. Es posible que los productos detallados arriba no constituyan una lista completa de los alimentos y las bebidas que puede tomar. Consulte a un nutricionista para obtener ms informacin. Qu alimentos debo evitar? Frutas Frutas enlatadas al almbar. Verduras Verduras enlatadas. Verduras congeladas con mantequilla o salsa de crema. Granos Productos elaborados con harina y harina blanca refinada, como panes, pastas, bocadillos y cereales. Evite todos los alimentos procesados. Carnes y otras protenas Cortes de carne con alto contenido de grasa. Carne de ave con piel. Carnes empanizadas o fritas. Carne procesada. Evite las grasas saturadas. Lcteos Yogur, queso o leche enteros. Bebidas Bebidas azucaradas, como gaseosas o t helado. Es posible que los productos que se enumeran ms arriba no constituyan una lista completa de los alimentos y las bebidas que debe evitar. Consulte a un nutricionista para obtener ms informacin. Preguntas para hacerle al mdico Debo consultar con un especialista certificado en atencin y educacin sobre la diabetes? Es necesario que me rena con un nutricionista? A qu nmero puedo llamar si tengo preguntas? Cules son los mejores momentos para controlar la glucemia? Dnde encontrar ms informacin: American Diabetes Association (Asociacin Estadounidense de la Diabetes): diabetes.org Academy of Nutrition and Dietetics (Academia de Nutricin y Diettica): eatright.org National Institute of Diabetes and Digestive and Kidney Diseases (Instituto Nacional de la Diabetes y las Enfermedades Digestivas y Renales): niddk.nih.gov Association of Diabetes   Care & Education Specialists (Asociacin de Especialistas en Atencin y Educacin sobre la Diabetes):  diabeteseducator.org Resumen Es importante tener hbitos alimenticios saludables debido a que sus niveles de azcar en la sangre (glucosa) se ven afectados en gran medida por lo que come y bebe. Es importante consumir alcohol con prudencia. Un plan de comidas saludable lo ayudar a controlar la glucosa en sangre y a reducir el riesgo de enfermedades cardacas. El mdico puede recomendarle que trabaje con un nutricionista para elaborar el mejor plan para usted. Esta informacin no tiene como fin reemplazar el consejo del mdico. Asegrese de hacerle al mdico cualquier pregunta que tenga. Document Revised: 05/28/2020 Document Reviewed: 05/28/2020 Elsevier Patient Education  2023 Elsevier Inc.  

## 2022-04-01 LAB — BASIC METABOLIC PANEL
BUN/Creatinine Ratio: 15 (ref 9–23)
BUN: 8 mg/dL (ref 6–24)
CO2: 25 mmol/L (ref 20–29)
Calcium: 10.5 mg/dL — ABNORMAL HIGH (ref 8.7–10.2)
Chloride: 101 mmol/L (ref 96–106)
Creatinine, Ser: 0.54 mg/dL — ABNORMAL LOW (ref 0.57–1.00)
Glucose: 90 mg/dL (ref 70–99)
Potassium: 3.9 mmol/L (ref 3.5–5.2)
Sodium: 142 mmol/L (ref 134–144)
eGFR: 109 mL/min/{1.73_m2} (ref >59)

## 2022-04-01 LAB — LIPID PANEL
Chol/HDL Ratio: 3.8 ratio (ref 0.0–4.4)
Cholesterol, Total: 221 mg/dL — ABNORMAL HIGH (ref 100–199)
HDL: 58 mg/dL (ref >39)
LDL Chol Calc (NIH): 111 mg/dL — ABNORMAL HIGH (ref 0–99)
Triglycerides: 305 mg/dL — ABNORMAL HIGH (ref 0–149)
VLDL Cholesterol Cal: 52 mg/dL — ABNORMAL HIGH (ref 5–40)

## 2022-04-02 ENCOUNTER — Other Ambulatory Visit: Payer: Self-pay

## 2022-04-12 ENCOUNTER — Other Ambulatory Visit: Payer: Self-pay

## 2022-04-13 ENCOUNTER — Other Ambulatory Visit: Payer: Self-pay

## 2022-04-28 ENCOUNTER — Encounter: Payer: Self-pay | Admitting: Orthopaedic Surgery

## 2022-04-28 ENCOUNTER — Other Ambulatory Visit: Payer: Self-pay

## 2022-04-28 ENCOUNTER — Ambulatory Visit (INDEPENDENT_AMBULATORY_CARE_PROVIDER_SITE_OTHER): Payer: Medicaid Other | Admitting: Orthopaedic Surgery

## 2022-04-28 DIAGNOSIS — G8929 Other chronic pain: Secondary | ICD-10-CM

## 2022-04-28 DIAGNOSIS — M5442 Lumbago with sciatica, left side: Secondary | ICD-10-CM

## 2022-04-28 DIAGNOSIS — I7774 Dissection of vertebral artery: Secondary | ICD-10-CM

## 2022-04-28 DIAGNOSIS — M5441 Lumbago with sciatica, right side: Secondary | ICD-10-CM | POA: Diagnosis not present

## 2022-04-28 NOTE — Progress Notes (Signed)
The patient comes in with continued low back pain that radiates into both thighs and her backside bilaterally.  We have seen her for some time for this.  Originally tried anti-inflammatories as well as muscle relaxant and outpatient physical therapy.  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  She cannot take anti-inflammatories now.  Her pain is getting worse especially when she is sitting for a while.  She denies any change in bowel or bladder functions.  She cannot take anti-inflammatories now due to being on blood thinning medication.  She is here today with an interpreter.  She walks without assistive device.  There is no limp to her gait.  On exam her bilateral extremities show excellent range of motion of her hips and knees.  Her pain seems to be with flexion extension of lumbar spine and definitely is medial and lateral and radiates into the sciatic area on both sides.  She is a diabetic but does not have significant neuropathy and is under pretty good control of her blood glucose.  This point we do need to obtain an MRI of the lumbar spine given the failure of all forms of conservative treatment.  This is to look for any type of nerve compression or other pathology such as facet joint arthritis as a contributing to her pain so we can come up with other treatment alternatives and options.  We can see her back once we have this MRI.  All questions and concerns were answered and addressed to the interpreter.

## 2022-04-29 ENCOUNTER — Other Ambulatory Visit: Payer: Self-pay

## 2022-04-29 DIAGNOSIS — G8929 Other chronic pain: Secondary | ICD-10-CM

## 2022-05-07 ENCOUNTER — Other Ambulatory Visit: Payer: Self-pay

## 2022-05-13 ENCOUNTER — Ambulatory Visit
Admission: RE | Admit: 2022-05-13 | Discharge: 2022-05-13 | Disposition: A | Discharge status: Home / Self Care | Payer: Medicaid Other | Source: Ambulatory Visit | Attending: Orthopaedic Surgery | Admitting: Orthopaedic Surgery

## 2022-05-13 DIAGNOSIS — G8929 Other chronic pain: Secondary | ICD-10-CM

## 2022-05-24 ENCOUNTER — Ambulatory Visit
Admission: RE | Admit: 2022-05-24 | Discharge: 2022-05-24 | Disposition: A | Discharge status: Home / Self Care | Payer: Medicaid Other | Source: Ambulatory Visit | Attending: Vascular Surgery | Admitting: Vascular Surgery

## 2022-05-24 ENCOUNTER — Encounter: Payer: Self-pay | Admitting: Physician Assistant

## 2022-05-24 ENCOUNTER — Ambulatory Visit: Payer: Medicaid Other | Admitting: Physician Assistant

## 2022-05-24 DIAGNOSIS — M5442 Lumbago with sciatica, left side: Secondary | ICD-10-CM

## 2022-05-24 DIAGNOSIS — M7061 Trochanteric bursitis, right hip: Secondary | ICD-10-CM

## 2022-05-24 DIAGNOSIS — M7062 Trochanteric bursitis, left hip: Secondary | ICD-10-CM

## 2022-05-24 DIAGNOSIS — G8929 Other chronic pain: Secondary | ICD-10-CM

## 2022-05-24 DIAGNOSIS — M5441 Lumbago with sciatica, right side: Secondary | ICD-10-CM

## 2022-05-24 DIAGNOSIS — I7774 Dissection of vertebral artery: Secondary | ICD-10-CM

## 2022-05-24 MED ORDER — IOPAMIDOL (ISOVUE-370) INJECTION 76%
75.0000 mL | Freq: Once | INTRAVENOUS | Status: AC | PRN
  Administered 2022-05-24: 75 mL via INTRAVENOUS

## 2022-05-24 NOTE — Progress Notes (Signed)
HPI: Mr. Elaine Burch comes in today to go over the MRI of her lumbar spine.  She states she is still having low back pain that is 2-3 out of 10 pain constant.  She has daily pain down the lateral aspect of both thighs 2-3 out of 10 pain.  Denies any numbness tingling but she does feel her right leg is weak compared to her left.  Pain is worse when lying down or when for standing from lying down position. MRI lumbar spine dated 05/13/2022 is reviewed with the patient.  This shows no canal or foraminal stenosis.  She has L3-S1 facet arthritic changes most notable with moderate facet changes at L5-S1.  Physical exam: Bilateral hips good range of motion without pain.  Tenderness over the trochanteric region and down the IT bands bilaterally.  Impression: Low back pain Trochanteric bursitis bilateral   Plan: We will have her undergo facet injections with Dr. Ernestina Patches then following up with [place] about 2 weeks after to see what type of response she has.  Plan: We will have her undergo facet injections with Dr. Ernestina Patches and following up with [place] about 2 weeks after this to go over results questions were encouraged and answered.  Interpreter was used as patient states pain.

## 2022-05-25 ENCOUNTER — Other Ambulatory Visit: Payer: Self-pay | Admitting: Family

## 2022-05-25 DIAGNOSIS — Z1231 Encounter for screening mammogram for malignant neoplasm of breast: Secondary | ICD-10-CM

## 2022-05-27 ENCOUNTER — Encounter: Payer: Self-pay | Admitting: Vascular Surgery

## 2022-05-27 ENCOUNTER — Ambulatory Visit: Payer: Medicaid Other | Admitting: Vascular Surgery

## 2022-05-27 VITALS — BP 138/77 | HR 75 | Temp 97.9°F | Resp 20 | Ht 60.0 in | Wt 182.0 lb

## 2022-05-27 DIAGNOSIS — I7774 Dissection of vertebral artery: Secondary | ICD-10-CM

## 2022-05-27 NOTE — Progress Notes (Signed)
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  On my review of those films I thought this finding was fairly subtle.  Regardless her most recent CT scan shows complete healing of the vertebral artery.  She has been on Eliquis for over 6 months now.  I have instructed her to discontinue this and begin taking 81 mg of aspirin daily.  I do not think any further follow-up is needed unless she develops new symptoms.  I will be happy to see her back at any time if she develops any new vascular issues.  HPI:   Elaine Burch is a pleasant 56 y.o. female who I saw in consultation in January of this year with a left vertebral artery dissection.  She developed a sudden onset of a frontal headache and some leg weakness.  CT angiogram of the neck suggested a possible dissection in the V3 segment of the left vertebral artery.  I obtained a carotid duplex scan to have as a baseline and this showed no evidence of carotid disease and patent vertebral arteries with antegrade flow.  She was treated with anticoagulation for 6 months.  I saw her back on 12/10/2021.  We arrange for the CT scan with the plans to stop her DOAC if the CT looks good and started on baby aspirin.  Since I saw her last, she denies any focal weakness or paresthesia.  She rarely gets a headache and takes Tylenol for this.  She had no dizziness.  Her blood pressure has been under good control.  She has been on Eliquis now for over 6 months.  Past Medical History:  Diagnosis Date   Arthritis    beginning   Diabetes mellitus without complication (HCC)    GERD (gastroesophageal reflux disease)    past hx of GERD   Hyperlipidemia     Family History  Problem Relation Age of Onset   Diabetes Mother    Cancer Father    Diabetes Brother    Breast cancer Neg Hx    Colon cancer Neg Hx     Colon polyps Neg Hx    Esophageal cancer Neg Hx    Rectal cancer Neg Hx    Stomach cancer Neg Hx     SOCIAL HISTORY: Social History   Tobacco Use   Smoking status: Never    Passive exposure: Never   Smokeless tobacco: Never  Substance Use Topics   Alcohol use: No    No Known Allergies  Current Outpatient Medications  Medication Sig Dispense Refill   acetaminophen (TYLENOL) 500 MG tablet Take 500 mg by mouth every 6 (six) hours as needed for moderate pain or headache.     Blood Glucose Monitoring Suppl (TRUE METRIX METER) w/Device KIT Use to check blood sugars 2-3 times per day 1 kit 0   glipiZIDE (GLUCOTROL) 5 MG tablet Take 1 tablet (5 mg total) by mouth daily before breakfast. 30 tablet 3   metFORMIN (GLUCOPHAGE) 500 MG tablet Take 2 tablets (1,000 mg total) by mouth 2 (two) times daily with a meal. 120 tablet 3   tiZANidine (ZANAFLEX) 4 MG tablet Take 1 tablet (4 mg total) by mouth 2 (two) times daily as needed for muscle spasms. 60 tablet 1   apixaban (ELIQUIS) 5 MG TABS tablet Take 1 tablet (5 mg total) by mouth 2 (two)  times daily. 60 tablet 0   atorvastatin (LIPITOR) 20 MG tablet Take 1 tablet (20 mg total) by mouth daily. 90 tablet 0   No current facility-administered medications for this visit.    REVIEW OF SYSTEMS:  _0  denotes positive finding, _1  denotes negative finding Cardiac  Comments:  Chest pain or chest pressure:    Shortness of breath upon exertion:    Short of breath when lying flat:    Irregular heart rhythm:        Vascular    Pain in calf, thigh, or hip brought on by ambulation:    Pain in feet at night that wakes you up from your sleep:     Blood clot in your veins:    Leg swelling:         Pulmonary    Oxygen at home:    Productive cough:     Wheezing:         Neurologic    Sudden weakness in arms or legs:     Sudden numbness in arms or legs:     Sudden onset of difficulty speaking or slurred speech:    Temporary loss of vision in  one eye:     Problems with dizziness:         Gastrointestinal    Blood in stool:     Vomited blood:         Genitourinary    Burning when urinating:     Blood in urine:        Psychiatric    Major depression:         Hematologic    Bleeding problems:    Problems with blood clotting too easily:        Skin    Rashes or ulcers:        Constitutional    Fever or chills:     PHYSICAL EXAM:   Vitals:   05/27/22 1302 05/27/22 1306  BP: 121/75 138/77  Pulse: 75   Resp: 20   Temp: 97.9 F (36.6 C)   SpO2: 95%   Weight: 182 lb (82.6 kg)   Height: 5' (1.524 m)    GENERAL: The patient is a well-nourished female, in no acute distress. The vital signs are documented above. CARDIAC: There is a regular rate and rhythm.  VASCULAR: I do not detect carotid bruits. He has palpable radial pulses. She has no lower extremity swelling. PULMONARY: There is good air exchange bilaterally without wheezing or rales. MUSCULOSKELETAL: There are no major deformities or cyanosis. NEUROLOGIC: No focal weakness or paresthesias are detected. SKIN: There are no ulcers or rashes noted. PSYCHIATRIC: The patient has a normal affect.  DATA:    CT ANGIO OF THE NECK: I have reviewed the images of the CT angio of the neck.  This shows healing of the left vertebral artery dissection.  There is no residual stenosis noted.  CT of the neck is otherwise unremarkable.  Deitra Mayo [facility] [phone]

## 2022-06-01 ENCOUNTER — Other Ambulatory Visit: Payer: Self-pay

## 2022-06-01 ENCOUNTER — Other Ambulatory Visit: Payer: Self-pay | Admitting: Family

## 2022-06-01 DIAGNOSIS — E119 Type 2 diabetes mellitus without complications: Secondary | ICD-10-CM

## 2022-06-01 DIAGNOSIS — E782 Mixed hyperlipidemia: Secondary | ICD-10-CM

## 2022-06-01 MED ORDER — ATORVASTATIN CALCIUM 20 MG PO TABS
20.0000 mg | ORAL_TABLET | Freq: Every day | ORAL | 0 refills | Status: DC
  Filled 2022-06-01: qty 90, 90d supply, fill #0

## 2022-06-01 NOTE — Telephone Encounter (Signed)
Requested medication (s) are due for refill today - expired Rx, not on current list  Requested medication (s) are on the active medication list -no  Future visit scheduled -yes  Last refill: 04/13/2021  Notes to clinic: testing supplies not current on medication list  Requested Prescriptions  Pending Prescriptions Disp Refills   TRUEplus Lancets 28G MISC 100 each 11    Sig: USE TO CHECK BLOOD SUGAR 2-3 TIMES DAILY.     Endocrinology: Diabetes - Testing Supplies Passed - 06/01/2022  9:43 AM      Passed - Valid encounter within last 12 months    Recent Outpatient Visits           2 months ago Type 2 diabetes mellitus without complication, without long-term current use of insulin [facility])   Primary Care at [facility], Flonnie Hailstone, NP   6 months ago Hospital discharge follow-up   Primary Care at [facility], [place], NP   8 months ago Menopause   [facility], Flonnie Hailstone, NP   11 months ago COVID-19   Primary Care at [facility], Kriste Basque, NP   1 year ago Annual physical exam   Primary Care at [facility], Amy J, NP       Future Appointments             In 1 month Camillia Herter, NP Primary Care at [facility]             glucose blood (TRUE METRIX BLOOD GLUCOSE TEST) test strip 100 strip 9    Sig: USE AS INSTRUCTED TO CHECK BLOOD SUGAR 2-3 TIMES     Endocrinology: Diabetes - Testing Supplies Passed - 06/01/2022  9:43 AM      Passed - Valid encounter within last 12 months    Recent Outpatient Visits           2 months ago Type 2 diabetes mellitus without complication, without long-term current use of insulin [facility])   [facility] at [facility], Flonnie Hailstone, NP   6 months ago Hospital discharge follow-up   Primary Care at [facility], Amy J, NP   8 months ago Menopause   Primary Care at [facility], Flonnie Hailstone, NP   11 months ago COVID-19   Primary Care at [facility],  Kriste Basque, NP   1 year ago Annual physical exam   Primary Care at [facility], Flonnie Hailstone, NP       Future Appointments             In 1 month Camillia Herter, NP Primary Care at [facility]               Requested Prescriptions  Pending Prescriptions Disp Refills   TRUEplus Lancets 28G MISC 100 each 11    Sig: USE TO CHECK BLOOD SUGAR 2-3 TIMES DAILY.     Endocrinology: Diabetes - Testing Supplies Passed - 06/01/2022  9:43 AM      Passed - Valid encounter within last 12 months    Recent Outpatient Visits           2 months ago Type 2 diabetes mellitus without complication, without long-term current use of insulin [facility])   Primary Care at [facility], Flonnie Hailstone, NP   6 months ago Hospital discharge follow-up   Primary Care at [facility], Amy J, NP   8 months ago  Menopause   Primary Care at [facility], [place], NP   11 months ago COVID-19   Primary Care at [facility], Kriste Basque, NP   1 year ago Annual physical exam   Primary Care at [facility], [place], NP       Future Appointments             In 1 month Camillia Herter, NP Primary [facility] at [facility]             glucose blood (TRUE METRIX BLOOD GLUCOSE TEST) test strip 100 strip 9    Sig: USE AS INSTRUCTED TO CHECK BLOOD SUGAR 2-3 TIMES     Endocrinology: Diabetes - Testing Supplies Passed - 06/01/2022  9:43 AM      Passed - Valid encounter within last 12 months    Recent Outpatient Visits           2 months ago Type 2 diabetes mellitus without complication, without long-term current use of insulin [facility])   Primary Care at [facility], Flonnie Hailstone, NP   6 months ago Hospital discharge follow-up   Primary Care at [facility], Amy J, NP   8 months ago Menopause   Primary Care at [facility], Flonnie Hailstone, NP   11 months ago COVID-19   Primary Care at [facility], Kriste Basque, NP   1 year ago Annual physical  exam   Primary Care at [facility], Flonnie Hailstone, NP       Future Appointments             In 1 month Camillia Herter, NP Primary Care at [facility]

## 2022-06-01 NOTE — Telephone Encounter (Signed)
Requested Prescriptions  Pending Prescriptions Disp Refills  . atorvastatin (LIPITOR) 20 MG tablet 90 tablet 0    Sig: Take 1 tablet (20 mg total) by mouth daily.     Cardiovascular:  Antilipid - Statins Failed - 06/01/2022  9:43 AM      Failed - Lipid Panel in normal range within the last 12 months    Cholesterol, Total  Date Value Ref Range Status  03/31/2022 221 (H) 100 - 199 mg/dL Final   LDL Chol Calc (NIH)  Date Value Ref Range Status  03/31/2022 111 (H) 0 - 99 mg/dL Final   HDL  Date Value Ref Range Status  03/31/2022 58 >39 mg/dL Final   Triglycerides  Date Value Ref Range Status  03/31/2022 305 (H) 0 - 149 mg/dL Final         Passed - Patient is not pregnant      Passed - Valid encounter within last 12 months    Recent Outpatient Visits          2 months ago Type 2 diabetes mellitus without complication, without long-term current use of insulin [facility])   Primary Care at [facility], Flonnie Hailstone, NP   6 months ago Hospital discharge follow-up   Primary Care at [facility], Amy J, NP   8 months ago Menopause   Primary Care at [facility], Flonnie Hailstone, NP   11 months ago COVID-19   Primary Care at [facility], Kriste Basque, NP   1 year ago Annual physical exam   Primary Care at [facility], Flonnie Hailstone, NP      Future Appointments            In 1 month Camillia Herter, NP Primary Care at [facility]

## 2022-06-02 ENCOUNTER — Other Ambulatory Visit: Payer: Self-pay

## 2022-06-03 ENCOUNTER — Other Ambulatory Visit: Payer: Self-pay

## 2022-06-03 MED ORDER — TRUEPLUS LANCETS 28G MISC
11 refills | Status: AC
  Filled 2022-06-03: qty 100, 33d supply, fill #0
  Filled 2022-11-19: qty 100, 33d supply, fill #1

## 2022-06-03 MED ORDER — TRUE METRIX BLOOD GLUCOSE TEST VI STRP
ORAL_STRIP | 9 refills | Status: DC
  Filled 2022-06-03: qty 100, 33d supply, fill #0
  Filled 2022-11-19: qty 100, 33d supply, fill #1

## 2022-06-04 ENCOUNTER — Other Ambulatory Visit: Payer: Self-pay

## 2022-06-18 ENCOUNTER — Ambulatory Visit: Payer: Medicaid Other

## 2022-06-28 ENCOUNTER — Ambulatory Visit: Payer: Medicaid Other

## 2022-07-06 ENCOUNTER — Other Ambulatory Visit: Payer: Self-pay

## 2022-07-19 NOTE — Progress Notes (Signed)
Patient ID: Elaine Burch, female    DOB: January 28, 1966  MRN: [id number]  CC: Chronic Care Management   Subjective: Elaine Burch is a 56 y.o. female who presents for chronic care management.   Her concerns today include:  - Doing well on diabetes and cholesterol medications, no issues/concerns. Home blood sugars 110's - 130's.  - Reports had eye exam. Next scheduled is laser eye surgery on 07/30/2022. - Back pain persisting for months (entire back). Denies recent trauma/injury and red flag symptoms. Worse with movement. 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 Taking over-the-counter Tylenol helps some.    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     apixaban (ELIQUIS) 5 MG TABS tablet Take 1 tablet (5 mg total) by mouth 2 (two) times daily. 60 tablet 0   Blood Glucose Monitoring Suppl (TRUE METRIX METER) w/Device KIT Use to check blood sugars 2-3 times per day 1 kit 0   glucose blood (TRUE METRIX BLOOD GLUCOSE TEST) test strip USE AS INSTRUCTED TO CHECK BLOOD SUGAR 2-3 TIMES 100 strip 9   tiZANidine (ZANAFLEX) 4 MG tablet Take 1 tablet (4 mg total) by mouth 2 (two) times daily as needed for muscle spasms. 60 tablet 1   TRUEplus Lancets 28G MISC USE TO CHECK BLOOD SUGAR 2-3 TIMES DAILY. 100 each 11   No current facility-administered  medications on file prior to visit.    No Known Allergies  Social History   Socioeconomic History   Marital status: Married    Spouse name: Not on file   Number of children: 4   Years of education: Not on file   Highest education level: 6th grade  Occupational History   Not on file  Tobacco Use   Smoking status: Never    Passive exposure: Never   Smokeless tobacco: Never  Vaping Use   Vaping Use: Never used  Substance and Sexual Activity   Alcohol use: No   Drug use: No   Sexual activity: Yes    Birth control/protection: None  Other Topics Concern   Not on file  Social History Narrative   Not on file   Social Determinants of Health   Financial Resource Strain: Not on file  Food Insecurity: No Food Insecurity (04/07/2021)   Hunger Vital Sign    Worried About Running Out of Food in the Last Year: Never true    Ran Out of Food in the Last Year: Never true  Transportation Needs: No Transportation Needs (04/07/2021)   PRAPARE - Hydrologist (Medical): No    Lack of Transportation (Non-Medical): No  Physical Activity: Inactive (11/07/2018)   Exercise Vital Sign    Days of Exercise per Week: 0 days    Minutes of Exercise per Session: 0 min  Stress: No Stress Concern Present (11/07/2018)   [place]  [employer] - Occupational Stress Questionnaire    Feeling of Stress : Only a little  Social Connections: Moderately Integrated (11/07/2018)   Social Connection and Isolation Panel [NHANES]    Frequency of Communication with Friends and Family: More than three times a week    Frequency of Social Gatherings with Friends and Family: More than three times a week    Attends Religious Services: 1 to 4 times per year    Active Member of [employer] or Organizations: No    Attends Archivist Meetings: Never    Marital Status: Married  Human resources officer Violence: Not At Risk (11/07/2018)   Humiliation, Afraid, Rape, and Kick questionnaire     Fear of Current or Ex-Partner: No    Emotionally Abused: No    Physically Abused: No    Sexually Abused: No    Family History  Problem Relation Age of Onset   Diabetes Mother    Cancer Father    Diabetes Brother    Breast cancer Neg Hx    Colon cancer Neg Hx    Colon polyps Neg Hx    Esophageal cancer Neg Hx    Rectal cancer Neg Hx    Stomach cancer Neg Hx     Past Surgical History:  Procedure Laterality Date   NO PAST SURGERIES      ROS: Review of Systems Negative except as stated above  PHYSICAL EXAM: BP 118/78 (BP Location: Left Arm, Patient Position: Sitting, Cuff Size: Large)   Pulse 65   Temp 98.3 F (36.8 C)   Resp 16   Ht 5' (1.524 m)   Wt 178 lb (80.7 kg)   SpO2 95%   BMI 34.76 kg/m   Physical Exam HENT:     Head: Normocephalic and atraumatic.  Eyes:     Extraocular Movements: Extraocular movements intact.     Conjunctiva/sclera: Conjunctivae normal.     Pupils: Pupils are equal, round, and reactive to light.  Cardiovascular:     Rate and Rhythm: Normal rate and regular rhythm.     Pulses: Normal pulses.     Heart sounds: Normal heart sounds.  Pulmonary:     Effort: Pulmonary effort is normal.     Breath sounds: Normal breath sounds.  Abdominal:     Palpations: Abdomen is soft.  Musculoskeletal:     Cervical back: Normal range of motion and neck supple. Tenderness present.     Thoracic back: Tenderness present.     Lumbar back: Tenderness present.  Neurological:     General: No focal deficit present.     Mental Status: She is alert and oriented to person, place, and time.  Psychiatric:        Mood and Affect: Mood normal.        Behavior: Behavior normal.    Results for orders placed or performed in visit on 07/26/22  POCT glycosylated hemoglobin (Hb A1C)  Result Value Ref Range   Hemoglobin A1C 6.8 (A) 4.0 - 5.6 %   HbA1c POC (<> result, manual entry)     HbA1c, POC (prediabetic range)     HbA1c, POC (controlled diabetic range)     POCT URINALYSIS DIP (CLINITEK)  Result Value Ref Range   Color, UA yellow yellow   Clarity, UA cloudy (A) clear   Glucose, UA negative negative mg/dL   Bilirubin, UA negative negative   Ketones, POC UA negative negative mg/dL   Spec Grav, UA 1.020 1.010 - 1.025  Blood, UA trace-intact (A) negative   pH, UA 6.0 5.0 - 8.0   POC PROTEIN,UA negative negative, trace   Urobilinogen, UA 0.2 0.2 or 1.0 E.U./dL   Nitrite, UA Negative Negative   Leukocytes, UA Negative Negative    ASSESSMENT AND PLAN: 1. Type 2 diabetes mellitus without complication, without long-term current use of insulin (HCC) - Hemoglobin A1c at goal at 6.8%, goal < 7%.  - Continue Metformin and Glipizide as prescribed.  - Discussed the importance of healthy eating habits, low-carbohydrate diet, low-sugar diet, regular aerobic exercise (at least 150 minutes a week as tolerated) and medication compliance to achieve or maintain control of diabetes. - Diabetic kidney evaluation - Urine ACR.  - Follow-up with primary provider in 3 months or sooner if needed.  - POCT glycosylated hemoglobin (Hb A1C) - glipiZIDE (GLUCOTROL) 5 MG tablet; Take 1 tablet (5 mg total) by mouth daily before breakfast.  Dispense: 30 tablet; Refill: 2 - metFORMIN (GLUCOPHAGE) 500 MG tablet; Take 2 tablets (1,000 mg total) by mouth 2 (two) times daily with a meal.  Dispense: 120 tablet; Refill: 2 - Microalbumin / creatinine urine ratio  2. Hyperlipidemia, unspecified hyperlipidemia type - Continue Atorvastatin as prescribed. - Follow-up with primary provider as scheduled.  - atorvastatin (LIPITOR) 20 MG tablet; Take 1 tablet (20 mg total) by mouth daily.  Dispense: 30 tablet; Refill: 2  3. Chronic back pain, unspecified back location, unspecified back pain laterality - Urinalysis negative for UTI.  - Routine cervicovaginal self-swab. - Routine hepatic function panel.  - Ketorolac injection and Triamcinolone injection administered in office.  -  Referral to Orthopedic Surgery for further evaluation and management.  - POCT URINALYSIS DIP (CLINITEK) - Cervicovaginal ancillary only - Hepatic Function Panel - ketorolac (TORADOL) injection 60 mg - triamcinolone acetonide (KENALOG-40) injection 40 mg - Ambulatory referral to Orthopedic Surgery  4. Need for immunization against influenza - Administered.  - Flu Vaccine QUAD 20moIM (Fluarix, Fluzone & Alfiuria Quad PF)  5. Need for shingles vaccine - Administered.  - Varicella-zoster vaccine IM  6. Language barrier - SBuilding control surveyor Name: SGeraldo Pitter ID#: [id number]  Patient was given the opportunity to ask questions.  Patient verbalized understanding of the plan and was able to repeat key elements of the plan. Patient was given clear instructions to go to Emergency Department or return to medical center if symptoms don't improve, worsen, or new problems develop.The patient verbalized understanding.   Orders Placed This Encounter  Procedures   Flu Vaccine QUAD 68moM (Fluarix, Fluzone & Alfiuria Quad PF)   Varicella-zoster vaccine IM   Hepatic Function Panel   Microalbumin / creatinine urine ratio   Ambulatory referral to Orthopedic Surgery   POCT glycosylated hemoglobin (Hb A1C)   POCT URINALYSIS DIP (CLINITEK)     Requested Prescriptions   Signed Prescriptions Disp Refills   atorvastatin (LIPITOR) 20 MG tablet 30 tablet 2    Sig: Take 1 tablet (20 mg total) by mouth daily.   glipiZIDE (GLUCOTROL) 5 MG tablet 30 tablet 2    Sig: Take 1 tablet (5 mg total) by mouth daily before breakfast.   metFORMIN (GLUCOPHAGE) 500 MG tablet 120 tablet 2    Sig: Take 2 tablets (1,000 mg total) by mouth 2 (two) times daily with a meal.    Return in about 3 months (around 10/26/2022) for Follow-Up or next available chronic care mgmt .  AmCamillia HerterNP

## 2022-07-26 ENCOUNTER — Encounter: Payer: Self-pay | Admitting: Family

## 2022-07-26 ENCOUNTER — Ambulatory Visit: Payer: Medicaid Other | Admitting: Family

## 2022-07-26 ENCOUNTER — Other Ambulatory Visit (HOSPITAL_COMMUNITY)
Admission: RE | Admit: 2022-07-26 | Discharge: 2022-07-26 | Disposition: A | Discharge status: Home / Self Care | Payer: Medicaid Other | Source: Ambulatory Visit | Attending: Family | Admitting: Family

## 2022-07-26 ENCOUNTER — Other Ambulatory Visit: Payer: Self-pay

## 2022-07-26 VITALS — BP 118/78 | HR 65 | Temp 98.3°F | Resp 16 | Ht 60.0 in | Wt 178.0 lb

## 2022-07-26 DIAGNOSIS — E119 Type 2 diabetes mellitus without complications: Secondary | ICD-10-CM

## 2022-07-26 DIAGNOSIS — M549 Dorsalgia, unspecified: Secondary | ICD-10-CM | POA: Diagnosis not present

## 2022-07-26 DIAGNOSIS — G8929 Other chronic pain: Secondary | ICD-10-CM | POA: Diagnosis not present

## 2022-07-26 DIAGNOSIS — Z23 Encounter for immunization: Secondary | ICD-10-CM | POA: Diagnosis not present

## 2022-07-26 DIAGNOSIS — E785 Hyperlipidemia, unspecified: Secondary | ICD-10-CM | POA: Diagnosis not present

## 2022-07-26 DIAGNOSIS — R109 Unspecified abdominal pain: Secondary | ICD-10-CM | POA: Insufficient documentation

## 2022-07-26 DIAGNOSIS — Z789 Other specified health status: Secondary | ICD-10-CM

## 2022-07-26 LAB — POCT URINALYSIS DIP (CLINITEK)
Bilirubin, UA: NEGATIVE
Glucose, UA: NEGATIVE mg/dL
Ketones, POC UA: NEGATIVE mg/dL
Leukocytes, UA: NEGATIVE
Nitrite, UA: NEGATIVE
POC PROTEIN,UA: NEGATIVE
Spec Grav, UA: 1.02 (ref 1.010–1.025)
Urobilinogen, UA: 0.2 E.U./dL
pH, UA: 6 (ref 5.0–8.0)

## 2022-07-26 LAB — POCT GLYCOSYLATED HEMOGLOBIN (HGB A1C): Hemoglobin A1C: 6.8 % — AB (ref 4.0–5.6)

## 2022-07-26 MED ORDER — ATORVASTATIN CALCIUM 20 MG PO TABS
20.0000 mg | ORAL_TABLET | Freq: Every day | ORAL | 2 refills | Status: DC
  Filled 2022-07-26 – 2022-08-23 (×2): qty 30, 30d supply, fill #0
  Filled 2022-09-20: qty 30, 30d supply, fill #1
  Filled 2022-10-19: qty 30, 30d supply, fill #2

## 2022-07-26 MED ORDER — TRIAMCINOLONE ACETONIDE 40 MG/ML IJ SUSP
40.0000 mg | Freq: Once | INTRAMUSCULAR | Status: AC
  Administered 2022-07-26: 40 mg via INTRAMUSCULAR

## 2022-07-26 MED ORDER — KETOROLAC TROMETHAMINE 60 MG/2ML IM SOLN
60.0000 mg | Freq: Once | INTRAMUSCULAR | Status: AC
  Administered 2022-07-26: 60 mg via INTRAMUSCULAR

## 2022-07-26 MED ORDER — GLIPIZIDE 5 MG PO TABS
5.0000 mg | ORAL_TABLET | Freq: Every day | ORAL | 2 refills | Status: DC
  Filled 2022-07-26 – 2022-08-23 (×2): qty 30, 30d supply, fill #0
  Filled 2022-09-20: qty 30, 30d supply, fill #1
  Filled 2022-10-19: qty 30, 30d supply, fill #2

## 2022-07-26 MED ORDER — METFORMIN HCL 500 MG PO TABS
1000.0000 mg | ORAL_TABLET | Freq: Two times a day (BID) | ORAL | 2 refills | Status: DC
  Filled 2022-07-26 – 2022-08-23 (×2): qty 120, 30d supply, fill #0
  Filled 2022-09-20: qty 120, 30d supply, fill #1
  Filled 2022-10-19: qty 120, 30d supply, fill #2

## 2022-07-26 NOTE — Progress Notes (Signed)
.  Pt presents for chronic care management   -pt complaining of right side pain going on for 2 months -experiencing pain in middle of back that radiates to neck

## 2022-07-27 ENCOUNTER — Other Ambulatory Visit: Payer: Self-pay | Admitting: Family

## 2022-07-27 DIAGNOSIS — B3731 Acute candidiasis of vulva and vagina: Secondary | ICD-10-CM | POA: Insufficient documentation

## 2022-07-27 LAB — CERVICOVAGINAL ANCILLARY ONLY
Bacterial Vaginitis (gardnerella): NEGATIVE
Candida Glabrata: NEGATIVE
Candida Vaginitis: POSITIVE — AB
Chlamydia: NEGATIVE
Comment: NEGATIVE
Comment: NEGATIVE
Comment: NEGATIVE
Comment: NEGATIVE
Comment: NEGATIVE
Comment: NORMAL
Neisseria Gonorrhea: NEGATIVE
Trichomonas: NEGATIVE

## 2022-07-27 LAB — HEPATIC FUNCTION PANEL
ALT: 30 IU/L (ref 0–32)
AST: 27 IU/L (ref 0–40)
Albumin: 4.8 g/dL (ref 3.8–4.9)
Alkaline Phosphatase: 81 IU/L (ref 44–121)
Bilirubin Total: 0.4 mg/dL (ref 0.0–1.2)
Bilirubin, Direct: 0.11 mg/dL (ref 0.00–0.40)
Total Protein: 7.5 g/dL (ref 6.0–8.5)

## 2022-07-27 MED ORDER — FLUCONAZOLE 150 MG PO TABS
150.0000 mg | ORAL_TABLET | Freq: Once | ORAL | 0 refills | Status: AC
  Filled 2022-07-27: qty 1, 1d supply, fill #0

## 2022-07-28 ENCOUNTER — Telehealth: Payer: Self-pay

## 2022-07-28 ENCOUNTER — Other Ambulatory Visit: Payer: Self-pay

## 2022-07-28 LAB — MICROALBUMIN / CREATININE URINE RATIO
Creatinine, Urine: 40 mg/dL
Microalb/Creat Ratio: 35 mg/g creat — ABNORMAL HIGH (ref 0–29)
Microalbumin, Urine: 14 ug/mL

## 2022-07-28 NOTE — Telephone Encounter (Signed)
Pt given lab results per notes of A. Minette Brine NP on 07/28/22. Pt verbalized understanding.

## 2022-07-29 ENCOUNTER — Other Ambulatory Visit: Payer: Self-pay

## 2022-08-02 ENCOUNTER — Ambulatory Visit: Payer: Medicaid Other

## 2022-08-23 ENCOUNTER — Other Ambulatory Visit: Payer: Self-pay

## 2022-08-23 ENCOUNTER — Ambulatory Visit (INDEPENDENT_AMBULATORY_CARE_PROVIDER_SITE_OTHER): Payer: Medicaid Other

## 2022-08-23 ENCOUNTER — Encounter: Payer: Self-pay | Admitting: Orthopaedic Surgery

## 2022-08-23 ENCOUNTER — Ambulatory Visit (INDEPENDENT_AMBULATORY_CARE_PROVIDER_SITE_OTHER): Payer: Medicaid Other | Admitting: Orthopaedic Surgery

## 2022-08-23 DIAGNOSIS — M542 Cervicalgia: Secondary | ICD-10-CM

## 2022-08-23 DIAGNOSIS — M545 Low back pain, unspecified: Secondary | ICD-10-CM | POA: Diagnosis not present

## 2022-08-23 DIAGNOSIS — G8929 Other chronic pain: Secondary | ICD-10-CM | POA: Diagnosis not present

## 2022-08-23 NOTE — Progress Notes (Signed)
The patient [facility] a [facility] sent back to [place] for evaluation treatment of low back pain as well as neck pain.  She does have a history of a vertebral artery dissection that has since healed.  She is now off of Eliquis and only on a baby aspirin daily.  She has been having neck pain.  She still been having low back pain.  We had wanted to set her up for facet joint injections in August in her lower lumbar spine based on MRI findings.  She still denies any radicular symptoms.  Somehow we did not get those injections ordered.  That is been her biggest complaint in terms of the pain she has a still in the low back.  She does take Zanaflex.  She has been through physical therapy as well.  X-rays of her cervical spine are unremarkable.  She does have some loss of her cervical lordosis but her disc heights are well-maintained.  Pain in her lower back is mainly at the facet joints areas of the lower lumbar spine.  Her previous MRI showed some moderate arthritic changes at L5-S1 and mild to moderate facet joint arthritic changes at L4-L5.  We will try again to get her set up for bilateral L5-S1 versus L4-L5 facet joint injections with Dr. Ernestina Patches if possible.  I would not recommend any treatment for her neck yet given her previous vertebral artery dissection although it is healed I would still not put her through any type of therapy yet for her neck.  We will see if we get these injection set up for her soon.

## 2022-08-30 ENCOUNTER — Other Ambulatory Visit: Payer: Self-pay | Admitting: Orthopaedic Surgery

## 2022-08-30 DIAGNOSIS — M545 Low back pain, unspecified: Secondary | ICD-10-CM

## 2022-09-02 ENCOUNTER — Ambulatory Visit
Admission: RE | Admit: 2022-09-02 | Discharge: 2022-09-02 | Disposition: A | Discharge status: Home / Self Care | Payer: Medicaid Other | Source: Ambulatory Visit | Attending: Family | Admitting: Family

## 2022-09-02 DIAGNOSIS — Z1231 Encounter for screening mammogram for malignant neoplasm of breast: Secondary | ICD-10-CM

## 2022-09-20 ENCOUNTER — Other Ambulatory Visit: Payer: Self-pay

## 2022-09-29 ENCOUNTER — Ambulatory Visit: Payer: Self-pay | Admitting: *Deleted

## 2022-09-29 NOTE — Telephone Encounter (Addendum)
[place] ID # [id number] Chief Complaint: post menopausal vaginal bleeding with abdominal pain Symptoms: spotting filling 1 pad greater than several hours bright red blood after intercourse. Spotting started Monday Abdominal pain and tenderness to touch at times. Will take tylenol for pain. Was taking eliquis and told to stop prior to August and started taking aspirin and wasn't sure how long to take so she stopped ASA in mid November.  Frequency: 09/27/22 Pertinent Negatives: Patient denies soaking pads, no dizziness no lightheadedness. No pain during intercourse.  Disposition: '[]'$ ED /'[]'$ Urgent Care (no appt availability in office) / '[x]'$ Appointment(In office/virtual)/ '[]'$  [facility]/ '[]'$ Home Care/ '[]'$ Refused Recommended Disposition /'[]'$ [facility]/ '[]'$  Follow-up with PCP Additional Notes:   Earliest appt 10/06/22. Please advise if earlier appt this week.      Reason for Disposition  [1] Bleeding/spotting after procedure (e.g., biopsy) or pelvic examination (e.g., pap smear) AND [2] lasts > 7 days  Answer Assessment - Initial Assessment Questions 1. AMOUNT: "Describe the bleeding that you are having." "How much bleeding is there?"    - SPOTTING: spotting, or pinkish / brownish mucous discharge; does not fill panty liner or pad    - MILD:  less than 1 pad / hour; less than patient's usual menstrual bleeding   - MODERATE: 1-2 pads / hour; 1 menstrual cup every 6 hours; small-medium blood clots (e.g., pea, grape, small coin)   - SEVERE: soaking 2 or more pads/hour for 2 or more hours; 1 menstrual cup every 2 hours; bleeding not contained by pads or continuous red blood from vagina; large blood clots (e.g., golf ball, large coin)      Mild bright red blood, no clots  2. ONSET: "When did the bleeding begin?" "Is it continuing now?"     Monday spotting x 2 Tuesday more spotting filling 1 full pad 3. MENOPAUSE: "When was your last menstrual period?"      2 years ago October 4.  ABDOMEN PAIN: "Do you have any pain?" "How bad is the pain?"  (e.g., Scale 1-10; mild, moderate, or severe)   - MILD (1-3): doesn't interfere with normal activities, abdomen soft and not tender to touch    - MODERATE (4-7): interferes with normal activities or awakens from sleep, abdomen tender to touch    - SEVERE (8-10): excruciating pain, doubled over, unable to do any normal activities      Mild pain with abdominal tenderness  5. BLOOD THINNERS: "Do you take any blood thinners?" (e.g., Coumadin/warfarin, Pradaxa/dabigatran, aspirin)     Eliquis but told to stop by MD and started taking aspirin but stopped in mid November. Wasn't sure how long to take aspirin and stopped in mid November 6. HORMONE MEDICINES: "Are you taking any hormone medicines, prescription or OTC?" (e.g., birth control pills, estrogen)     na 7. CAUSE: "What do you think is causing the bleeding?" (e.g., recent gyn surgery, recent gyn procedure; known bleeding disorder, uterine cancer)       na 8. HEMODYNAMIC STATUS: "Are you weak or feeling lightheaded?" If Yes, ask: "Can you stand and walk normally?"       no 9. OTHER SYMPTOMS: "What other symptoms are you having with the bleeding?" (e.g., back pain, burning with urination, fever)     Abdominal pain goes to back .  Protocols used: Vaginal Bleeding - Postmenopausal-A-AH

## 2022-09-29 NOTE — Telephone Encounter (Signed)
Appt cancelled pt would need to contact [facility] Ph: [phone], for further evaluation of vaginal bleeding

## 2022-10-05 ENCOUNTER — Encounter: Payer: Self-pay | Admitting: Radiology

## 2022-10-06 ENCOUNTER — Ambulatory Visit: Payer: Medicaid Other | Admitting: Family

## 2022-10-19 ENCOUNTER — Other Ambulatory Visit: Payer: Self-pay

## 2022-10-20 NOTE — Progress Notes (Signed)
Patient ID: Elaine Burch, female    DOB: 05-10-1966  MRN: [id number]  CC: Chronic Care Management   Subjective: Elaine Burch de Pricilla Holm is a 57 y.o. female who presents for chronic care management.   Her concerns today include:  Doing well on diabetic and cholesterol medications, no issues/concerns. Denies red flag symptoms. Home blood sugars 110's - 120's.   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     apixaban (ELIQUIS) 5 MG TABS tablet Take 1 tablet (5 mg total) by mouth 2 (two) times daily. 60 tablet 0   Blood Glucose Monitoring Suppl (TRUE METRIX METER) w/Device KIT Use to check blood sugars 2-3 times per day 1 kit 0   glucose blood (TRUE METRIX BLOOD GLUCOSE TEST) test strip USE AS INSTRUCTED TO CHECK BLOOD SUGAR 2-3 TIMES 100 strip 9   tiZANidine (ZANAFLEX) 4 MG tablet Take 1 tablet (4 mg total) by mouth 2 (two) times daily as needed for muscle spasms. 60 tablet 1   TRUEplus Lancets 28G MISC USE TO CHECK BLOOD SUGAR 2-3 TIMES DAILY. 100 each 11   No current facility-administered medications on file prior to visit.    No Known Allergies  Social History   Socioeconomic History   Marital status: Married    Spouse name: Not on file   Number of children: 4   Years of education: Not on file   Highest education level: 6th grade  Occupational History   Not on file  Tobacco Use    Smoking status: Never    Passive exposure: Never   Smokeless tobacco: Never  Vaping Use   Vaping Use: Never used  Substance and Sexual Activity   Alcohol use: No   Drug use: No   Sexual activity: Yes    Birth control/protection: None  Other Topics Concern   Not on file  Social History Narrative   Not on file   Social Determinants of Health   Financial Resource Strain: Not on file  Food Insecurity: No Food Insecurity (04/07/2021)   Hunger Vital Sign    Worried About Running Out of Food in the Last Year: Never true    Ran Out of Food in the Last Year: Never true  Transportation Needs: No Transportation Needs (04/07/2021)   PRAPARE - Hydrologist (Medical): No    Lack of Transportation (Non-Medical): No  Physical Activity: Inactive (11/07/2018)   Exercise Vital Sign    Days of Exercise per Week: 0 days    Minutes of Exercise per Session: 0 min  Stress: No Stress Concern Present (11/07/2018)   [place]    Feeling of Stress : Only a little  Social Connections: Moderately Integrated (11/07/2018)   Social Connection and Isolation Panel [NHANES]    Frequency of Communication with Friends and Family: More than three times a week  Frequency of Social Gatherings with Friends and Family: More than three times a week    Attends Religious Services: 1 to 4 times per year    Active Member of [employer] or Organizations: No    Attends Archivist Meetings: Never    Marital Status: Married  Human resources officer Violence: Not At Risk (11/07/2018)   Humiliation, Afraid, Rape, and Kick questionnaire    Fear of Current or Ex-Partner: No    Emotionally Abused: No    Physically Abused: No    Sexually Abused: No    Family History  Problem Relation Age of Onset   Diabetes Mother    Cancer Father    Diabetes Brother    Breast cancer Neg Hx    Colon cancer Neg Hx    Colon polyps Neg Hx    Esophageal  cancer Neg Hx    Rectal cancer Neg Hx    Stomach cancer Neg Hx     Past Surgical History:  Procedure Laterality Date   NO PAST SURGERIES      ROS: Review of Systems Negative except as stated above  PHYSICAL EXAM: BP 126/80 (BP Location: Left Arm, Patient Position: Sitting, Cuff Size: Large)   Pulse 62   Temp 98.3 F (36.8 C)   Resp 16   Ht 5' (1.524 m)   Wt 178 lb (80.7 kg)   SpO2 96%   BMI 34.76 kg/m   Physical Exam HENT:     Head: Normocephalic and atraumatic.  Eyes:     Extraocular Movements: Extraocular movements intact.     Conjunctiva/sclera: Conjunctivae normal.     Pupils: Pupils are equal, round, and reactive to light.  Cardiovascular:     Rate and Rhythm: Normal rate and regular rhythm.     Pulses: Normal pulses.     Heart sounds: Normal heart sounds.  Pulmonary:     Effort: Pulmonary effort is normal.     Breath sounds: Normal breath sounds.  Musculoskeletal:     Cervical back: Normal range of motion and neck supple.  Neurological:     General: No focal deficit present.     Mental Status: She is alert and oriented to person, place, and time.  Psychiatric:        Mood and Affect: Mood normal.        Behavior: Behavior normal.    Results for orders placed or performed in visit on 10/26/22  POCT glycosylated hemoglobin (Hb A1C)  Result Value Ref Range   Hemoglobin A1C 6.5 (A) 4.0 - 5.6 %   HbA1c POC (<> result, manual entry)     HbA1c, POC (prediabetic range)     HbA1c, POC (controlled diabetic range)      ASSESSMENT AND PLAN: 1. Type 2 diabetes mellitus without complication, without long-term current use of insulin (HCC) - Hemoglobin A1c at goal at 6.5%, goal 7%.  - Continue Metformin and Glipizide as prescribed. Counseled on medication adherence/adverse effects.  - Update BMP.  - Discussed the importance of healthy eating habits, low-carbohydrate diet, low-sugar diet, regular aerobic exercise (at least 150 minutes a week as tolerated) and  medication compliance to achieve or maintain control of diabetes. - Follow-up with primary provider in 3 months or sooner if needed.  - POCT glycosylated hemoglobin (Hb V2Z) - Basic Metabolic Panel - metFORMIN (GLUCOPHAGE) 500 MG tablet; Take 2 tablets (1,000 mg total) by mouth 2 (two) times daily with a meal.  Dispense: 120 tablet; Refill: 2 - glipiZIDE (GLUCOTROL) 5  MG tablet; Take 1 tablet (5 mg total) by mouth daily before breakfast.  Dispense: 30 tablet; Refill: 2  2. Hyperlipidemia, unspecified hyperlipidemia type - Continue Atorvastatin as prescribed. Counseled on medication adherence/adverse effects.  - Follow-up with primary provider in 3 months or sooner if needed.  - atorvastatin (LIPITOR) 20 MG tablet; Take 1 tablet (20 mg total) by mouth daily.  Dispense: 30 tablet; Refill: 2  3. Need for shingles vaccine - Administered.  - Varicella-zoster vaccine IM  4. Language barrier - Building control surveyor. Name: Christy Sartorius  ID#: [id number]    Patient was given the opportunity to ask questions.  Patient verbalized understanding of the plan and was able to repeat key elements of the plan. Patient was given clear instructions to go to Emergency Department or return to medical center if symptoms don't improve, worsen, or new problems develop.The patient verbalized understanding.   Orders Placed This Encounter  Procedures   Varicella-zoster vaccine IM   Basic Metabolic Panel   POCT glycosylated hemoglobin (Hb A1C)     Requested Prescriptions   Signed Prescriptions Disp Refills   metFORMIN (GLUCOPHAGE) 500 MG tablet 120 tablet 2    Sig: Take 2 tablets (1,000 mg total) by mouth 2 (two) times daily with a meal.   glipiZIDE (GLUCOTROL) 5 MG tablet 30 tablet 2    Sig: Take 1 tablet (5 mg total) by mouth daily before breakfast.   atorvastatin (LIPITOR) 20 MG tablet 30 tablet 2    Sig: Take 1 tablet (20 mg total) by mouth daily.    Return in about 3 months (around 01/25/2023) for Follow-Up or  next available chronic care mgmt .  Camillia Herter, NP

## 2022-10-26 ENCOUNTER — Other Ambulatory Visit: Payer: Self-pay

## 2022-10-26 ENCOUNTER — Telehealth: Payer: Self-pay | Admitting: Family

## 2022-10-26 ENCOUNTER — Ambulatory Visit: Payer: Medicaid Other | Admitting: Family

## 2022-10-26 ENCOUNTER — Encounter: Payer: Self-pay | Admitting: Family

## 2022-10-26 VITALS — BP 126/80 | HR 62 | Temp 98.3°F | Resp 16 | Ht 60.0 in | Wt 178.0 lb

## 2022-10-26 DIAGNOSIS — E119 Type 2 diabetes mellitus without complications: Secondary | ICD-10-CM

## 2022-10-26 DIAGNOSIS — E785 Hyperlipidemia, unspecified: Secondary | ICD-10-CM | POA: Diagnosis not present

## 2022-10-26 DIAGNOSIS — Z23 Encounter for immunization: Secondary | ICD-10-CM

## 2022-10-26 DIAGNOSIS — Z789 Other specified health status: Secondary | ICD-10-CM

## 2022-10-26 LAB — POCT GLYCOSYLATED HEMOGLOBIN (HGB A1C): Hemoglobin A1C: 6.5 % — AB (ref 4.0–5.6)

## 2022-10-26 MED ORDER — ATORVASTATIN CALCIUM 20 MG PO TABS
20.0000 mg | ORAL_TABLET | Freq: Every day | ORAL | 2 refills | Status: DC
  Filled 2022-10-26 – 2022-11-19 (×2): qty 30, 30d supply, fill #0
  Filled 2022-12-17: qty 30, 30d supply, fill #1
  Filled 2023-01-21: qty 30, 30d supply, fill #2

## 2022-10-26 MED ORDER — GLIPIZIDE 5 MG PO TABS
5.0000 mg | ORAL_TABLET | Freq: Every day | ORAL | 2 refills | Status: DC
  Filled 2022-10-26 – 2022-11-19 (×2): qty 30, 30d supply, fill #0
  Filled 2022-12-17: qty 30, 30d supply, fill #1
  Filled 2023-01-21: qty 30, 30d supply, fill #2

## 2022-10-26 MED ORDER — METFORMIN HCL 500 MG PO TABS
1000.0000 mg | ORAL_TABLET | Freq: Two times a day (BID) | ORAL | 2 refills | Status: DC
  Filled 2022-10-26 – 2022-11-19 (×2): qty 120, 30d supply, fill #0
  Filled 2022-12-17: qty 120, 30d supply, fill #1
  Filled 2023-01-21: qty 120, 30d supply, fill #2

## 2022-10-26 NOTE — Progress Notes (Signed)
.  Pt presents for chronic care management   -needs 2nd shingle vaccine

## 2022-10-26 NOTE — Telephone Encounter (Signed)
Pt saw Medication list on AVS and stated the Eliquis and Tizandine are medications she is no longer taking.

## 2022-10-27 ENCOUNTER — Ambulatory Visit: Payer: Self-pay

## 2022-10-27 LAB — BASIC METABOLIC PANEL
BUN/Creatinine Ratio: 26 — ABNORMAL HIGH (ref 9–23)
BUN: 13 mg/dL (ref 6–24)
CO2: 23 mmol/L (ref 20–29)
Calcium: 9.7 mg/dL (ref 8.7–10.2)
Chloride: 105 mmol/L (ref 96–106)
Creatinine, Ser: 0.5 mg/dL — ABNORMAL LOW (ref 0.57–1.00)
Glucose: 108 mg/dL — ABNORMAL HIGH (ref 70–99)
Potassium: 4.3 mmol/L (ref 3.5–5.2)
Sodium: 143 mmol/L (ref 134–144)
eGFR: 111 mL/min/{1.73_m2} (ref >59)

## 2022-10-27 NOTE — Telephone Encounter (Signed)
Spanish interpreter [place], # [phone]. Pt. Had Shingles vaccination yesterday. Shortly afterward left arm began to hurt and has mild swelling,redness. Taking Tylenol. Will do warm compresses. Asking if she needs to do anything else. Please advise pt.

## 2022-10-27 NOTE — Telephone Encounter (Signed)
I consulted with Dorna Mai, MD. Patient should continue with Tylenol and warm compresses. No further advisement as of present.

## 2022-10-27 NOTE — Telephone Encounter (Signed)
Answer Assessment - Initial Assessment Questions 1. SYMPTOMS: "What is the main symptom?" (e.g., redness, swelling, pain)      Left arm red, swollen 2. ONSET: "When was the vaccine (shot) given?" "How much later did the  begin?" (e.g., hours, days ago)      Hours  3. SEVERITY: "How bad is it?"      Moderate 4. FEVER: "Is there a fever?" If Yes, ask: "What is it, how was it measured, and when did it start?"   Chills last nigh 5. IMMUNIZATIONS GIVEN: "What shots have you recently received?"     Shingles 6. PAST REACTIONS: "Have you reacted to immunizations before?" If Yes, ask: "What happened?"     No 7. OTHER SYMPTOMS: "Do you have any other symptoms?"     None  Protocols used: Immunization Reactions-A-AH

## 2022-10-28 ENCOUNTER — Other Ambulatory Visit: Payer: Self-pay

## 2022-11-05 ENCOUNTER — Telehealth: Payer: Self-pay

## 2022-11-05 NOTE — Telephone Encounter (Signed)
Pt given lab results per notes of Amy, NP on 11/05/22. Pt verbalized understanding. Interpreter Marcello Moores [phone]

## 2022-11-19 ENCOUNTER — Other Ambulatory Visit: Payer: Self-pay

## 2022-11-20 ENCOUNTER — Other Ambulatory Visit: Payer: Self-pay

## 2022-11-20 DIAGNOSIS — E119 Type 2 diabetes mellitus without complications: Secondary | ICD-10-CM

## 2022-11-20 MED ORDER — ACCU-CHEK GUIDE VI STRP
1.0000 | ORAL_STRIP | Freq: Three times a day (TID) | 0 refills | Status: DC
  Filled 2022-11-20: qty 100, 34d supply, fill #0

## 2022-11-20 MED ORDER — ACCU-CHEK SOFTCLIX LANCETS MISC
1.0000 | Freq: Three times a day (TID) | 0 refills | Status: DC
  Filled 2022-11-20: qty 100, 34d supply, fill #0

## 2022-11-20 MED ORDER — ACCU-CHEK GUIDE W/DEVICE KIT
1.0000 | PACK | Freq: Three times a day (TID) | 0 refills | Status: AC
  Filled 2022-11-20: qty 1, 30d supply, fill #0

## 2022-11-22 ENCOUNTER — Other Ambulatory Visit: Payer: Self-pay

## 2022-11-23 ENCOUNTER — Ambulatory Visit: Payer: Medicaid Other | Admitting: Family Medicine

## 2022-11-23 ENCOUNTER — Other Ambulatory Visit (HOSPITAL_COMMUNITY)
Admission: RE | Admit: 2022-11-23 | Discharge: 2022-11-23 | Disposition: A | Discharge status: Home / Self Care | Payer: Medicaid Other | Source: Ambulatory Visit | Attending: Family Medicine | Admitting: Family Medicine

## 2022-11-23 ENCOUNTER — Other Ambulatory Visit: Payer: Self-pay

## 2022-11-23 ENCOUNTER — Encounter: Payer: Self-pay | Admitting: Family Medicine

## 2022-11-23 VITALS — BP 131/79 | HR 73 | Ht 61.0 in | Wt 183.8 lb

## 2022-11-23 DIAGNOSIS — N950 Postmenopausal bleeding: Secondary | ICD-10-CM | POA: Insufficient documentation

## 2022-11-23 NOTE — Progress Notes (Signed)
GYNECOLOGY OFFICE VISIT NOTE  History:   Elaine Burch is a 57 y.o. [phone] here today for post menopausal bleeding.  Reports she had not had a period since 07/2020 At the end of December she had bleeding for 4 days It was light, much lighter than her usual period from before Used to get small clots, but not with this bleeding two months ago Has intermittent bilateral pelvic pain, sometimes hurts for days No history of abnormal pap smears Has not been very sexually active since menopause started But she did have sex about 2 days before this last bleeding episode   Health Maintenance Due  Topic Date Due   FOOT EXAM  Never done   OPHTHALMOLOGY EXAM  Never done   COVID-19 Vaccine (2 - 2023-24 season) 06/04/2022    Past Medical History:  Diagnosis Date   Arthritis    beginning   Diabetes mellitus without complication (HCC)    GERD (gastroesophageal reflux disease)    past hx of GERD   Hyperlipidemia     Past Surgical History:  Procedure Laterality Date   NO PAST SURGERIES      The following portions of the patient's history were reviewed and updated as appropriate: allergies, current medications, past family history, past medical history, past social history, past surgical history and problem list.   Health Maintenance:   Last pap: Lab Results  Component Value Date   DIAGPAP  11/20/2021    - Negative for intraepithelial lesion or malignancy (NILM)   [place] Negative 11/20/2021     Last mammogram:  09/02/2022 - B[facility] 1    Review of Systems:  Pertinent items noted in HPI and remainder of comprehensive ROS otherwise negative.  Physical Exam:  BP 131/79   Pulse 73   Ht 5' 1"$  (1.549 m)   Wt 183 lb 12.8 oz (83.4 kg)   LMP  (LMP Unknown) Comment: pt states no cycle for 2 years until 09/28/22 x 4 days.  BMI 34.73 kg/m  CONSTITUTIONAL: Well-developed, well-nourished female in no acute distress.  HEENT:  Normocephalic, atraumatic. External right and  left ear normal. No scleral icterus.  NECK: Normal range of motion, supple, no masses noted on observation SKIN: No rash noted. Not diaphoretic. No erythema. No pallor. MUSCULOSKELETAL: Normal range of motion. No edema noted. NEUROLOGIC: Alert and oriented to person, place, and time. Normal muscle tone coordination.  PSYCHIATRIC: Normal mood and affect. Normal behavior. Normal judgment and thought content. RESPIRATORY: Effort normal, no problems with respiration noted PELVIC: Normal appearing external genitalia; normal appearing vaginal mucosa and cervix.  No abnormal discharge noted.  Labs and Imaging No results found for this or any previous visit (from the past 168 hour(s)). No results found.    Assessment and Plan:   Problem List Items Addressed This Visit       Other   Post-menopausal bleeding - Primary    Discussed potential etiologies including atrophy, structural abnormalities, rarely uterine cancer. Recommended repeat pap smear and EMB, she was amenable with this plan, procedure was uncomplicated. Also ordered for pelvic US. Follow up pending results.       Relevant Orders   Surgical pathology( [place]/ [facility])   Cytology - PAP( [place])   US PELVIC COMPLETE WITH TRANSVAGINAL    Routine preventative health maintenance measures emphasized. Please refer to After Visit Summary for other counseling recommendations.   Return pending biopsy results.    Total face-to-face time with patient: 25 minutes.  Over 50%  of encounter was spent on counseling and coordination of care.   Clarnce Flock, MD/MPH Attending Family Medicine Physician, [facility] for [facility], [place]

## 2022-11-23 NOTE — Assessment & Plan Note (Signed)
Discussed potential etiologies including atrophy, structural abnormalities, rarely uterine cancer. Recommended repeat pap smear and EMB, she was amenable with this plan, procedure was uncomplicated. Also ordered for pelvic US. Follow up pending results.

## 2022-11-23 NOTE — Progress Notes (Signed)
    GYNECOLOGY CLINIC ENDOMETRIAL BIOPSY PROCEDURE NOTE  Elaine Burch is a y 57 y.o. [phone] here for endometrial biopsy for post menopausal bleeding.  Discussed nature of the procedure and risks and benefits.  Pregnancy test: n/a, post menopausal  No Known Allergies  Patient given informed consent, signed copy in the chart, time out was performed.    The patient was placed in the lithotomy position and the cervix brought into view with sterile speculum.  Cervix cleansed x 2 with betadine swabs. The uterus was sounded for depth of 9. A pipelle was introduced to into the uterus, suction created,  and an endometrial sample was obtained x2 due to scant return. All equipment was removed and accounted for.  The patient tolerated the procedure well.   Patient given post procedure instructions.  Per patient preference she will be notified of results by telephone.  Clarnce Flock, MD/MPH Attending Family Medicine Physician, [facility] for [facility], [place]

## 2022-11-24 ENCOUNTER — Other Ambulatory Visit: Payer: Self-pay

## 2022-11-27 LAB — SURGICAL PATHOLOGY

## 2022-11-30 ENCOUNTER — Telehealth: Payer: Self-pay

## 2022-11-30 LAB — CYTOLOGY - PAP
Chlamydia: NEGATIVE
Comment: NEGATIVE
Comment: NEGATIVE
Comment: NEGATIVE
Comment: NORMAL
Diagnosis: NEGATIVE
Diagnosis: REACTIVE
High risk HPV: NEGATIVE
Neisseria Gonorrhea: NEGATIVE
Trichomonas: NEGATIVE

## 2022-11-30 NOTE — Telephone Encounter (Addendum)
-----   Message from Clarnce Flock, MD sent at 11/30/2022  8:33 AM EST ----- Please let patient know that her pap smear was normal. Taken together with her endometrial biopsy being normal this means her bleeding was not due to cancer but more likely vaginal or endometrial atrophy. We will still wait to see if there is a polyp or something like that on the ultrasound but overall she has been ruled out for cancer and I am not worried about her bleeding unless it is persistent.    Called pt with interpreter Rosemarie Ax; reviewed results. Also reviewed upcoming [place] appt. Pt will follow up with office if bleeding continues. Pt reports no bleeding since visit.

## 2022-12-01 ENCOUNTER — Ambulatory Visit: Payer: Medicaid Other | Admitting: Family

## 2022-12-01 ENCOUNTER — Other Ambulatory Visit: Payer: Self-pay

## 2022-12-01 VITALS — BP 127/62 | HR 65 | Temp 98.0°F | Resp 16 | Ht 60.98 in | Wt 180.0 lb

## 2022-12-01 DIAGNOSIS — R21 Rash and other nonspecific skin eruption: Secondary | ICD-10-CM | POA: Diagnosis not present

## 2022-12-01 DIAGNOSIS — T148XXA Other injury of unspecified body region, initial encounter: Secondary | ICD-10-CM | POA: Diagnosis not present

## 2022-12-01 MED ORDER — TRIAMCINOLONE ACETONIDE 0.1 % EX CREA
1.0000 | TOPICAL_CREAM | Freq: Two times a day (BID) | CUTANEOUS | 0 refills | Status: DC
  Filled 2022-12-01: qty 60, 30d supply, fill #0

## 2022-12-01 NOTE — Progress Notes (Signed)
Patient ID: Elaine Burch, female    DOB: [birth year]  MRN: [id number]  CC: Bruise  Subjective: Elaine Burch is a 57 y.o. female who presents for bruise.  Her concerns today include:  Patient reports on 11/22/2022 while walking down the steps she forgot there was an "extra step". States at that point her left lower leg buckled and hit a concrete step. States she did not fall. She denies red flag symptoms. She is able to ambulate as normal. Reports initially left lower leg had a "lump" which has since resolved and bruising has improved some since then as well. Today she is having some pain of left lower leg at "one spot" anteriorly. She is taking over-the-counter Tylenol to help with pain and reports pain is tolerable. States her main concern today is the "rash" of the left ankle. States her vascular surgeon discontinued Apixaban in August 2023 and she currently takes a daily aspirin. States she is unsure of how long she is supposed to take daily aspirin. I discussed with patient in detail to consult with her vascular surgeon for recommendations regarding this and she verbalized understanding/agreement. No further issues/concerns today.   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 100 each 0   acetaminophen (TYLENOL) 500 MG tablet Take 500 mg by mouth every 6 (six) hours as needed for moderate  pain or headache.     aspirin EC 81 MG tablet Take 81 mg by mouth daily. Swallow whole.     atorvastatin (LIPITOR) 20 MG tablet Take 1 tablet (20 mg total) by mouth daily. 30 tablet 2   Blood Glucose Monitoring Suppl (ACCU-CHEK GUIDE) w/Device KIT Use in the morning, at noon, and at bedtime. 1 kit 0   glipiZIDE (GLUCOTROL) 5 MG tablet Take 1 tablet (5 mg total) by mouth daily before breakfast. 30 tablet 2   glucose blood (ACCU-CHEK GUIDE) test strip Test in the morning, at noon, and at bedtime. 100 each 0   metFORMIN (GLUCOPHAGE) 500 MG tablet Take 2 tablets (1,000 mg total) by mouth 2 (two) times daily with a meal. 120 tablet 2   tiZANidine (ZANAFLEX) 4 MG tablet Take 1 tablet (4 mg total) by mouth 2 (two) times daily as needed for muscle spasms. (Patient not taking: Reported on 11/23/2022) 60 tablet 1   TRUEplus Lancets 28G MISC USE TO CHECK BLOOD SUGAR 2-3 TIMES DAILY. 100 each 11   No current facility-administered medications on file prior to visit.    No Known Allergies  Social History   Socioeconomic History   Marital status: Married    Spouse name: Not on file   Number of children: 4   Years of education: Not on file   Highest education level: 6th grade  Occupational History   Not on file  Tobacco Use   Smoking status: Never    Passive exposure: Never   Smokeless tobacco: Never  Vaping Use  Vaping Use: Never used  Substance and Sexual Activity   Alcohol use: No   Drug use: No   Sexual activity: Yes    Birth control/protection: Post-menopausal  Other Topics Concern   Not on file  Social History Narrative   Not on file   Social Determinants of Health   Financial Resource Strain: Not on file  Food Insecurity: Food Insecurity Present (11/23/2022)   Hunger Vital Sign    Worried About Running Out of Food in the Last Year: Sometimes true    Ran Out of Food in the Last Year: Sometimes true  Transportation Needs: No Transportation Needs (11/23/2022)   PRAPARE -  Hydrologist (Medical): No    Lack of Transportation (Non-Medical): No  Physical Activity: Inactive (11/07/2018)   Exercise Vital Sign    Days of Exercise per Week: 0 days    Minutes of Exercise per Session: 0 min  Stress: No Stress Concern Present (11/07/2018)   [place]    Feeling of Stress : Only a little  Social Connections: Moderately Integrated (11/07/2018)   Social Connection and Isolation Panel [NHANES]    Frequency of Communication with Friends and Family: More than three times a week    Frequency of Social Gatherings with Friends and Family: More than three times a week    Attends Religious Services: 1 to 4 times per year    Active Member of [employer] or Organizations: No    Attends Archivist Meetings: Never    Marital Status: Married  Human resources officer Violence: Not At Risk (11/07/2018)   Humiliation, Afraid, Rape, and Kick questionnaire    Fear of Current or Ex-Partner: No    Emotionally Abused: No    Physically Abused: No    Sexually Abused: No    Family History  Problem Relation Age of Onset   Diabetes Mother    Cancer Father    Diabetes Brother    Breast cancer Neg Hx    Colon cancer Neg Hx    Colon polyps Neg Hx    Esophageal cancer Neg Hx    Rectal cancer Neg Hx    Stomach cancer Neg Hx     Past Surgical History:  Procedure Laterality Date   NO PAST SURGERIES      ROS: Review of Systems Negative except as stated above  PHYSICAL EXAM: BP 127/62 (BP Location: Left Arm, Patient Position: Sitting, Cuff Size: Normal)   Pulse 65   Temp 98 F (36.7 C)   Resp 16   Ht 5' 0.98" (1.549 m)   Wt 180 lb (81.6 kg)   LMP  (LMP Unknown) Comment: pt states no cycle for 2 years until 09/28/22 x 4 days.  SpO2 96%   BMI 34.03 kg/m   Physical Exam HENT:     Head: Normocephalic and atraumatic.  Eyes:     Extraocular Movements: Extraocular movements intact.      Conjunctiva/sclera: Conjunctivae normal.     Pupils: Pupils are equal, round, and reactive to light.  Cardiovascular:     Rate and Rhythm: Normal rate and regular rhythm.     Pulses: Normal pulses.     Heart sounds: Normal heart sounds.  Pulmonary:     Effort: Pulmonary effort is normal.     Breath sounds: Normal breath sounds.  Musculoskeletal:     Cervical back: Normal range of motion and neck supple.     Right hip:  Normal.     Left hip: Normal.     Right upper leg: Normal.     Left upper leg: Normal.     Right knee: Normal.     Left knee: Normal.     Right lower leg: Normal.     Right ankle: Normal.     Right foot: Normal.     Left foot: Normal.     Comments: Left lower extremity bruising and left ankle rash.  Skin:    Findings: Rash present.     Comments: Bruising left lower extremity. Rash appears as "carpet burn" of left ankle with no evidence of drainage. No additional presentation of left lower extremity.  Neurological:     General: No focal deficit present.     Mental Status: She is alert and oriented to person, place, and time.  Psychiatric:        Mood and Affect: Mood normal.        Behavior: Behavior normal.      ASSESSMENT AND PLAN: 1. Bruise 2. Rash and nonspecific skin eruption - Patient today in office with no red flag symptoms.  - Patient ambulating as normal.  - Discussed with patient in detail expected course of bruise to heal.  - Triamcinolone cream as prescribed. Counseled on medication adherence/adverse effects. - Follow-up with primary provider as scheduled.  - triamcinolone cream (KENALOG) 0.1 %; Apply 1 Application topically 2 (two) times daily.  Dispense: 60 g; Refill: 0    Patient was given the opportunity to ask questions.  Patient verbalized understanding of the plan and was able to repeat key elements of the plan. Patient was given clear instructions to go to Emergency Department or return to medical center if symptoms don't improve,  worsen, or new problems develop.The patient verbalized understanding.    Requested Prescriptions   Signed Prescriptions Disp Refills   triamcinolone cream (KENALOG) 0.1 % 60 g 0    Sig: Apply 1 Application topically 2 (two) times daily.    Follow-up with primary provider as scheduled.   Camillia Herter, NP

## 2022-12-01 NOTE — Progress Notes (Signed)
Pt presents for left leg bruising -states that she missed step and hit leg -started off with bruising, now has red area above ankle area  -pain is only at the area where she hit leg during fall

## 2022-12-09 ENCOUNTER — Encounter: Payer: Self-pay | Admitting: Radiology

## 2022-12-17 ENCOUNTER — Other Ambulatory Visit: Payer: Self-pay

## 2022-12-20 ENCOUNTER — Other Ambulatory Visit: Payer: Self-pay

## 2022-12-20 ENCOUNTER — Ambulatory Visit (HOSPITAL_COMMUNITY)
Admission: RE | Admit: 2022-12-20 | Discharge: 2022-12-20 | Disposition: A | Discharge status: Home / Self Care | Payer: Medicaid Other | Source: Ambulatory Visit | Attending: Family Medicine | Admitting: Family Medicine

## 2022-12-20 DIAGNOSIS — N950 Postmenopausal bleeding: Secondary | ICD-10-CM | POA: Insufficient documentation

## 2022-12-21 ENCOUNTER — Telehealth: Payer: Self-pay

## 2022-12-21 ENCOUNTER — Other Ambulatory Visit: Payer: Self-pay

## 2022-12-21 NOTE — Progress Notes (Signed)
Unremarkable pelvic US Please let patient know along with her normal pap smear and endometrial biopsy her vaginal bleeding was due to benign causes like atrophy. No further workup is needed unless she has lots of bleeding. If bleeding is persistent we can trial vaginal estrogen cream. Follow up PRN

## 2022-12-21 NOTE — Telephone Encounter (Addendum)
-----   Message from Clarnce Flock, MD sent at 12/21/2022 11:17 AM EDT ----- Unremarkable pelvic US Please let patient know along with her normal pap smear and endometrial biopsy her vaginal bleeding was due to benign causes like atrophy. No further workup is needed unless she has lots of bleeding. If bleeding is persistent we can trial vaginal estrogen cream. Follow up PRN  Notified pt provider's recommendation.  Pt states that she would like to try to the vaginal estrogen cream.  Pt informed that it may take a couple days as the provider is not in the office.  Pt verbalized understanding with no further questions.   Mel Almond, RN  01/03/23

## 2023-01-03 ENCOUNTER — Other Ambulatory Visit: Payer: Self-pay

## 2023-01-03 MED ORDER — ESTROGENS CONJUGATED 0.625 MG/GM VA CREA
1.0000 | TOPICAL_CREAM | Freq: Every day | VAGINAL | 12 refills | Status: DC
  Filled 2023-01-03: qty 60, 30d supply, fill #0

## 2023-01-03 NOTE — Telephone Encounter (Signed)
Premarin estrogen cream sent to pharmacy.  Please let patient know she can pick it up anytime.

## 2023-01-04 ENCOUNTER — Other Ambulatory Visit (HOSPITAL_COMMUNITY): Payer: Self-pay

## 2023-01-04 ENCOUNTER — Other Ambulatory Visit: Payer: Self-pay

## 2023-01-04 NOTE — Telephone Encounter (Signed)
Notified pt with Elaine Burch Spanish Interpreter the requested vaginal cream has been sent to her [facility].  Pt verbalized understanding with no further questions.    [name]

## 2023-01-05 ENCOUNTER — Other Ambulatory Visit: Payer: Self-pay

## 2023-01-17 NOTE — Progress Notes (Signed)
Patient ID: Elaine Burch, female    DOB: 1966/04/14  MRN: [id number]  CC: Chronic Care Management   Subjective: Elaine Burch is a 57 y.o. female who presents for chronic care management.   Her concerns today include:  - Doing well on diabetes medications, no issues/concerns. She denies red flag symptoms associated with diabetes.  - Doing well on cholesterol medication, no issues/concerns.  - Intermittent bilateral chest pain. Comes and goes, time varies. She denies additional associated red flag symptoms. Today pain 2/10.  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 100 each 0   acetaminophen (TYLENOL) 500 MG tablet Take 500 mg by mouth every 6 (six) hours as needed for moderate pain or headache.     aspirin EC 81 MG tablet Take 81 mg by mouth daily. Swallow whole.     Blood Glucose Monitoring Suppl (ACCU-CHEK GUIDE) w/Device KIT Use in the morning, at noon, and at bedtime. 1 kit 0   conjugated estrogens (PREMARIN) vaginal cream Place 1 Applicatorful vaginally daily. 42.5 g 12   tiZANidine (ZANAFLEX) 4 MG tablet Take 1 tablet (4 mg total) by mouth 2 (two) times daily as needed for muscle spasms. (Patient not taking: Reported on 11/23/2022) 60 tablet 1   triamcinolone cream (KENALOG) 0.1 % Apply 1 Application topically 2 (two) times daily. 60 g 0   TRUEplus Lancets 28G MISC USE TO CHECK BLOOD SUGAR  2-3 TIMES DAILY. 100 each 11   No current facility-administered medications on file prior to visit.    No Known Allergies  Social History   Socioeconomic History   Marital status: Married    Spouse name: Not on file   Number of children: 4   Years of education: Not on file   Highest education level: 6th grade  Occupational History   Not on file  Tobacco Use   Smoking status: Never    Passive exposure: Never   Smokeless tobacco: Never  Vaping Use   Vaping Use: Never used  Substance and Sexual Activity   Alcohol use: No   Drug use: No   Sexual activity: Yes    Birth control/protection: Post-menopausal  Other Topics Concern   Not on file  Social History Narrative   Not on file   Social Determinants of Health   Financial Resource Strain: Not on file  Food Insecurity: Food Insecurity Present (11/23/2022)   Hunger Vital Sign    Worried About Running Out of Food in the Last Year: Sometimes true    Ran Out of Food in the Last Year: Sometimes true  Transportation Needs: No Transportation Needs (11/23/2022)   PRAPARE - Administrator, Civil Service (Medical): No    Lack of Transportation (Non-Medical): No  Physical Activity: Inactive (11/07/2018)   Exercise Vital Sign    Days of Exercise per Week: 0 days    Minutes of Exercise per Session: 0 min  Stress: No Stress  Concern Present (11/07/2018)   [employer] of Occupational Health - Occupational Stress Questionnaire    Feeling of Stress : Only a little  Social Connections: Moderately Integrated (11/07/2018)   Social Connection and Isolation Panel [NHANES]    Frequency of Communication with Friends and Family: More than three times a week    Frequency of Social Gatherings with Friends and Family: More than three times a week    Attends Religious Services: 1 to 4 times per year    Active Member of [employer] or Organizations: No    Attends Banker Meetings: Never    Marital Status: Married  Careers information officer Violence: Not At Risk (11/07/2018)   Humiliation, Afraid, Rape, and Kick questionnaire    Fear of Current or Ex-Partner: No    Emotionally Abused: No    Physically Abused: No    Sexually Abused: No    Family History  Problem Relation Age of Onset   Diabetes Mother    Cancer Father    Diabetes Brother    Breast cancer Neg Hx    Colon cancer Neg Hx    Colon polyps Neg Hx    Esophageal cancer Neg Hx    Rectal cancer Neg Hx    Stomach cancer Neg Hx     Past Surgical History:  Procedure Laterality Date   NO PAST SURGERIES      ROS: Review of Systems Negative except as stated above  PHYSICAL EXAM: BP 109/73   Pulse (!) 58   Temp 98.1 F (36.7 C) (Oral)   Resp 16   Wt 182 lb (82.6 kg)   SpO2 97%   BMI 34.41 kg/m   Physical Exam HENT:     Head: Normocephalic and atraumatic.  Eyes:     Extraocular Movements: Extraocular movements intact.     Conjunctiva/sclera: Conjunctivae normal.     Pupils: Pupils are equal, round, and reactive to light.  Cardiovascular:     Rate and Rhythm: Bradycardia present.     Pulses: Normal pulses.     Heart sounds: Normal heart sounds.  Pulmonary:     Effort: Pulmonary effort is normal.     Breath sounds: Normal breath sounds.  Musculoskeletal:     Cervical back: Normal range of motion and neck supple.  Neurological:     General: No focal deficit present.     Mental Status: She is alert and oriented to person, place, and time.  Psychiatric:        Mood and Affect: Mood normal.        Behavior: Behavior normal.    Results for orders placed or performed in visit on 01/25/23  POCT glycosylated hemoglobin (Hb A1C)  Result Value Ref Range   Hemoglobin A1C     HbA1c POC (<> result, manual entry)     HbA1c, POC (prediabetic range)     HbA1c, POC (controlled diabetic range) 7.1 (A) 0.0 - 7.0 %     ASSESSMENT AND PLAN: 1. Type 2 diabetes mellitus without complication, without long-term current use of insulin - Diabetes  relative to goal at 7.1%, goal 7%. This is increased compared to previous 6.5%.  - Continue Metformin and Glipizide as prescribed. - Discussed the importance of healthy eating habits, low-carbohydrate diet, low-sugar diet, regular aerobic exercise (at least 150 minutes a week as tolerated) and medication compliance to achieve or maintain control of diabetes. - Follow-up with primary provider in 6 months or sooner if needed.  - POCT glycosylated hemoglobin (  Hb A1C) - glipiZIDE (GLUCOTROL) 5 MG tablet; Take 1 tablet (5 mg total) by mouth daily before breakfast.  Dispense: 90 tablet; Refill: 0 - metFORMIN (GLUCOPHAGE) 500 MG tablet; Take 2 tablets (1,000 mg total) by mouth 2 (two) times daily with a meal.  Dispense: 360 tablet; Refill: 0  2. Hyperlipidemia, unspecified hyperlipidemia type - Continue Atorvastatin as prescribed.  - Follow-up with primary provider in 6 months or sooner if needed.  - atorvastatin (LIPITOR) 20 MG tablet; Take 1 tablet (20 mg total) by mouth daily.  Dispense: 90 tablet; Refill: 0  3. Chest pain, unspecified type - Patient today in office with no cardiopulmonary distress.  - Omeprazole as prescribed. Counseled on medication adherence/adverse effects. - Referral to Cardiology for further evaluation/management.  - omeprazole (PRILOSEC) 20 MG capsule; Take 1 capsule (20 mg total) by mouth daily.  Dispense: 30 capsule; Refill: 1 - Ambulatory referral to Cardiology  4. Language barrier - Photographer. Name: Byrd Hesselbach ID#: [id number]   Patient was given the opportunity to ask questions.  Patient verbalized understanding of the plan and was able to repeat key elements of the plan. Patient was given clear instructions to go to Emergency Department or return to medical center if symptoms don't improve, worsen, or new problems develop.The patient verbalized understanding.   Orders Placed This Encounter  Procedures   Ambulatory referral to Cardiology   POCT glycosylated  hemoglobin (Hb A1C)     Requested Prescriptions   Signed Prescriptions Disp Refills   glipiZIDE (GLUCOTROL) 5 MG tablet 90 tablet 0    Sig: Take 1 tablet (5 mg total) by mouth daily before breakfast.   metFORMIN (GLUCOPHAGE) 500 MG tablet 360 tablet 0    Sig: Take 2 tablets (1,000 mg total) by mouth 2 (two) times daily with a meal.   atorvastatin (LIPITOR) 20 MG tablet 90 tablet 0    Sig: Take 1 tablet (20 mg total) by mouth daily.   omeprazole (PRILOSEC) 20 MG capsule 30 capsule 1    Sig: Take 1 capsule (20 mg total) by mouth daily.    Return in about 6 months (around 07/27/2023) for Follow-Up or next available chronic care mgmt .  Rema Fendt, NP

## 2023-01-21 ENCOUNTER — Other Ambulatory Visit: Payer: Self-pay

## 2023-01-25 ENCOUNTER — Ambulatory Visit: Payer: Medicaid Other | Admitting: Family

## 2023-01-25 ENCOUNTER — Other Ambulatory Visit: Payer: Self-pay

## 2023-01-25 VITALS — BP 109/73 | HR 58 | Temp 98.1°F | Resp 16 | Wt 182.0 lb

## 2023-01-25 DIAGNOSIS — R079 Chest pain, unspecified: Secondary | ICD-10-CM | POA: Diagnosis not present

## 2023-01-25 DIAGNOSIS — Z758 Other problems related to medical facilities and other health care: Secondary | ICD-10-CM

## 2023-01-25 DIAGNOSIS — Z603 Acculturation difficulty: Secondary | ICD-10-CM

## 2023-01-25 DIAGNOSIS — E785 Hyperlipidemia, unspecified: Secondary | ICD-10-CM

## 2023-01-25 DIAGNOSIS — E119 Type 2 diabetes mellitus without complications: Secondary | ICD-10-CM | POA: Diagnosis not present

## 2023-01-25 LAB — POCT GLYCOSYLATED HEMOGLOBIN (HGB A1C): HbA1c, POC (controlled diabetic range): 7.1 % — AB (ref 0.0–7.0)

## 2023-01-25 MED ORDER — OMEPRAZOLE 20 MG PO CPDR
20.0000 mg | DELAYED_RELEASE_CAPSULE | Freq: Every day | ORAL | 1 refills | Status: DC
  Filled 2023-01-25: qty 30, 30d supply, fill #0

## 2023-01-25 MED ORDER — ATORVASTATIN CALCIUM 20 MG PO TABS
20.0000 mg | ORAL_TABLET | Freq: Every day | ORAL | 0 refills | Status: DC
  Filled 2023-01-25 – 2023-02-18 (×2): qty 90, 90d supply, fill #0

## 2023-01-25 MED ORDER — METFORMIN HCL 500 MG PO TABS
1000.0000 mg | ORAL_TABLET | Freq: Two times a day (BID) | ORAL | 0 refills | Status: DC
  Filled 2023-01-25 – 2023-02-18 (×2): qty 360, 90d supply, fill #0

## 2023-01-25 MED ORDER — GLIPIZIDE 5 MG PO TABS
5.0000 mg | ORAL_TABLET | Freq: Every day | ORAL | 0 refills | Status: DC
  Filled 2023-01-25 – 2023-02-18 (×2): qty 90, 90d supply, fill #0

## 2023-01-25 NOTE — Progress Notes (Signed)
Patient said that she has chest pain sometimes on both sides. Patient said that sometimes it's present and other times not 2/10 today

## 2023-01-27 ENCOUNTER — Other Ambulatory Visit: Payer: Self-pay

## 2023-02-18 ENCOUNTER — Other Ambulatory Visit: Payer: Self-pay

## 2023-02-21 ENCOUNTER — Other Ambulatory Visit: Payer: Self-pay

## 2023-03-01 ENCOUNTER — Other Ambulatory Visit: Payer: Self-pay

## 2023-03-20 NOTE — Progress Notes (Unsigned)
  Cardiology Office Note:   Date:  03/22/2023  ID:  Elaine Burch, DOB 1965/12/10, MRN [id number]  History of Present Illness:   Elaine Burch is a 57 y.o. female with history of vertebral artery disseaction, DMII, and HLD who was referred by Ricky Stabs, NP for further evaluation of chest pain.  Patient seen by PCP office on 01/25/23. Notes reviewed. Reported intermittent chest pain prompting referral to Cardiology for further evaluation.  Today, the patient states she has been having episodes of chest pain. Symptoms usually occur after cleaning/washing floors with significant chest wall tenderness. No exertional chest pain and patient is able to walk several flights of stairs without issue.  No SOB, palpitations, LE edema, orthopnea or PND. No known family history of CAD.   Past Medical History:  Diagnosis Date   Arthritis    beginning   Diabetes mellitus without complication (HCC)    GERD (gastroesophageal reflux disease)    past hx of GERD   Hyperlipidemia      ROS: As per HPI  Studies Reviewed:    EKG:  NSR-personally reviewed       Risk Assessment/Calculations:              Physical Exam:   VS:  BP 120/72   Pulse 70   Ht 5\' 2"  (1.575 m)   Wt 181 lb 6.4 oz (82.3 kg)   SpO2 96%   BMI 33.18 kg/m    Wt Readings from Last 3 Encounters:  03/22/23 181 lb 6.4 oz (82.3 kg)  01/25/23 182 lb (82.6 kg)  12/01/22 180 lb (81.6 kg)     GEN: Well nourished, well developed in no acute distress NECK: No JVD; No carotid bruits CARDIAC: RRR, no murmurs, rubs, gallops. Chest wall tender to palpation RESPIRATORY:  Clear to auscultation without rales, wheezing or rhonchi  ABDOMEN: Soft, non-tender, non-distended EXTREMITIES:  No edema; No deformity   ASSESSMENT AND PLAN:    #Atypical Chest Pain: -Likely MSK etiology as chest is painful to palpation and symptoms worsen after vigorous motion of washing the floors -She is able to walk several flights of  stairs without exertional symptoms -Will check Ca score for risk stratification -Continue symptomatic management with tylenol or ibuprofen  #HLD: -Continue lipitor 20mg  daily -Check lipids when fasting  #DMII: -Management per PCP        Signed, Meriam Sprague, MD

## 2023-03-22 ENCOUNTER — Ambulatory Visit: Payer: Medicaid Other | Attending: Cardiology | Admitting: Cardiology

## 2023-03-22 ENCOUNTER — Encounter: Payer: Self-pay | Admitting: Cardiology

## 2023-03-22 ENCOUNTER — Other Ambulatory Visit: Payer: Self-pay

## 2023-03-22 VITALS — BP 120/72 | HR 70 | Ht 62.0 in | Wt 181.4 lb

## 2023-03-22 DIAGNOSIS — E782 Mixed hyperlipidemia: Secondary | ICD-10-CM | POA: Diagnosis not present

## 2023-03-22 DIAGNOSIS — R079 Chest pain, unspecified: Secondary | ICD-10-CM | POA: Diagnosis not present

## 2023-03-22 DIAGNOSIS — E119 Type 2 diabetes mellitus without complications: Secondary | ICD-10-CM

## 2023-03-22 DIAGNOSIS — Z7984 Long term (current) use of oral hypoglycemic drugs: Secondary | ICD-10-CM | POA: Diagnosis not present

## 2023-03-22 NOTE — Patient Instructions (Signed)
Medication Instructions:   Your physician recommends that you continue on your current medications as directed. Please refer to the Current Medication list given to you today.  *If you need a refill on your cardiac medications before your next appointment, please call your pharmacy*   Lab Work:  SOMETIME SOON HERE IN THE OFFICE--LIPIDS--PLEASE COME FASTING TO THIS LAB APPOINTMENT  If you have labs (blood work) drawn today and your tests are completely normal, you will receive your results only by: MyChart Message (if you have MyChart) OR A paper copy in the mail If you have any lab test that is abnormal or we need to change your treatment, we will call you to review the results.   Testing/Procedures:  CARDIAC CALCIUM SCORE (SELF PAY)    Follow-Up: At [facility], you and your health needs are our priority.  As part of our continuing mission to provide you with exceptional heart care, we have created designated Provider Care Teams.  These Care Teams include your primary Cardiologist (physician) and Advanced Practice Providers (APPs -  Physician Assistants and Nurse Practitioners) who all work together to provide you with the care you need, when you need it.  We recommend signing up for the patient portal called "MyChart".  Sign up information is provided on this After Visit Summary.  MyChart is used to connect with patients for Virtual Visits (Telemedicine).  Patients are able to view lab/test results, encounter notes, upcoming appointments, etc.  Non-urgent messages can be sent to your provider as well.   To learn more about what you can do with MyChart, go to [email or web address].    Your next appointment:   1 year(s)  Provider:   DR. MARK SKAINS

## 2023-03-23 ENCOUNTER — Ambulatory Visit: Payer: Medicaid Other | Attending: Cardiology

## 2023-03-23 DIAGNOSIS — E119 Type 2 diabetes mellitus without complications: Secondary | ICD-10-CM

## 2023-03-23 DIAGNOSIS — R079 Chest pain, unspecified: Secondary | ICD-10-CM

## 2023-03-23 DIAGNOSIS — E782 Mixed hyperlipidemia: Secondary | ICD-10-CM

## 2023-03-23 LAB — LIPID PANEL
Chol/HDL Ratio: 2.3 ratio (ref 0.0–4.4)
Cholesterol, Total: 130 mg/dL (ref 100–199)
HDL: 56 mg/dL (ref >39)
LDL Chol Calc (NIH): 49 mg/dL (ref 0–99)
Triglycerides: 145 mg/dL (ref 0–149)
VLDL Cholesterol Cal: 25 mg/dL (ref 5–40)

## 2023-04-18 ENCOUNTER — Ambulatory Visit (HOSPITAL_COMMUNITY)
Admission: RE | Admit: 2023-04-18 | Discharge: 2023-04-18 | Disposition: A | Discharge status: Home / Self Care | Payer: Medicaid Other | Source: Ambulatory Visit | Attending: Cardiology | Admitting: Cardiology

## 2023-04-18 DIAGNOSIS — E782 Mixed hyperlipidemia: Secondary | ICD-10-CM | POA: Insufficient documentation

## 2023-04-18 DIAGNOSIS — E119 Type 2 diabetes mellitus without complications: Secondary | ICD-10-CM | POA: Insufficient documentation

## 2023-04-28 ENCOUNTER — Other Ambulatory Visit: Payer: Self-pay | Admitting: Family

## 2023-04-28 DIAGNOSIS — J439 Emphysema, unspecified: Secondary | ICD-10-CM

## 2023-04-28 DIAGNOSIS — J849 Interstitial pulmonary disease, unspecified: Secondary | ICD-10-CM

## 2023-05-17 ENCOUNTER — Other Ambulatory Visit: Payer: Self-pay | Admitting: Family

## 2023-05-17 ENCOUNTER — Other Ambulatory Visit: Payer: Self-pay

## 2023-05-17 DIAGNOSIS — E785 Hyperlipidemia, unspecified: Secondary | ICD-10-CM

## 2023-05-17 DIAGNOSIS — E119 Type 2 diabetes mellitus without complications: Secondary | ICD-10-CM

## 2023-05-17 MED ORDER — METFORMIN HCL 500 MG PO TABS
1000.0000 mg | ORAL_TABLET | Freq: Two times a day (BID) | ORAL | 0 refills | Status: DC
  Filled 2023-05-17: qty 360, 90d supply, fill #0

## 2023-05-17 MED ORDER — GLIPIZIDE 5 MG PO TABS
5.0000 mg | ORAL_TABLET | Freq: Every day | ORAL | 0 refills | Status: DC
  Filled 2023-05-17: qty 90, 90d supply, fill #0

## 2023-05-17 MED ORDER — ATORVASTATIN CALCIUM 20 MG PO TABS
20.0000 mg | ORAL_TABLET | Freq: Every day | ORAL | 0 refills | Status: DC
  Filled 2023-05-17: qty 90, 90d supply, fill #0

## 2023-05-17 NOTE — Telephone Encounter (Signed)
 Complete. Schedule appointment.

## 2023-05-18 ENCOUNTER — Other Ambulatory Visit: Payer: Self-pay

## 2023-05-20 ENCOUNTER — Other Ambulatory Visit (HOSPITAL_BASED_OUTPATIENT_CLINIC_OR_DEPARTMENT_OTHER): Payer: Self-pay

## 2023-05-23 ENCOUNTER — Other Ambulatory Visit: Payer: Self-pay

## 2023-06-09 NOTE — Telephone Encounter (Signed)
See routing comment(s) for message information.

## 2023-06-10 ENCOUNTER — Institutional Professional Consult (permissible substitution): Payer: Medicaid Other | Admitting: Pulmonary Disease

## 2023-06-30 ENCOUNTER — Telehealth: Payer: Self-pay | Admitting: Family

## 2023-07-26 ENCOUNTER — Encounter: Payer: Self-pay | Admitting: Internal Medicine

## 2023-07-26 ENCOUNTER — Ambulatory Visit: Payer: Medicaid Other | Admitting: Internal Medicine

## 2023-07-26 VITALS — BP 110/74 | HR 71 | Ht 63.0 in | Wt 178.0 lb

## 2023-07-26 DIAGNOSIS — R9389 Abnormal findings on diagnostic imaging of other specified body structures: Secondary | ICD-10-CM

## 2023-07-26 DIAGNOSIS — M549 Dorsalgia, unspecified: Secondary | ICD-10-CM | POA: Diagnosis not present

## 2023-07-26 DIAGNOSIS — Z758 Other problems related to medical facilities and other health care: Secondary | ICD-10-CM

## 2023-07-26 NOTE — Patient Instructions (Addendum)
ICD-10-CM   1. Abnormal CT of the chest  R93.89     2. Upper back pain  M54.9        Plan  - do HRCT supine and prone and inspitratory and expiratory volume -do full PFT  - talk to PCP Rema Fendt, NP about neck evaluation such has neurology referral or MRI neck  Followup  - return to see DR Marchelle Gearing or NP after tests

## 2023-07-26 NOTE — Progress Notes (Signed)
OV 07/26/2023  Subjective:  Patient ID: Elaine Burch, female , DOB: 1966-02-08 , age 57 y.o. , MRN: [id number] , ADDRESS: [address] [place] [address] PCP Rema Fendt, NP Patient Care Team: Rema Fendt, NP as PCP - General (Nurse Practitioner) Kathryne Hitch, MD as Consulting Physician (Orthopedic Surgery)  This Provider for this visit: Treatment Team:  Attending Provider: Josephine Igo, DO    07/26/2023 -   Chief Complaint  Patient presents with   Consult    Pt states she has been fatigue. No concerns with breathing f/u ild     HPI Elaine Burch 57 y.o. -new consult.  Presents with Spanish interpreter.  She reports upper back.  Neck pain that goes into the front of the chest.  Been going on for years on and off.  This resulted in a cardiac CT which showed changes of ILD and therefore she has been referred here.  She states other than this pain she is essentially asymptomatic.  No shortness of breath.  No cough no wheezing no paroxysmal nocturnal dyspnea no orthopnea.  Her appetite is good.  There is no nausea no vomiting no diarrhea no anxiety.   [facility] Comprehensive ILD Questionnaire    Past Medical History :  -There is no asthma no COPD diagnosis.  No scleroderma no collagen vascular disease no autoimmune disease.  No Sjogren's no hiatal hernia no acid reflux.  No - She reported yes for diabetes - She says she has had blood clots in the past not otherwise specified - She did have COVID disease in 2023 but she is also had COVID-vaccine   ROS:  -Positive for fatigue and arthralgia - Positive for obesity  FAMILY HISTORY of LUNG DISEASE:  Denies  PERSONAL EXPOSURE HISTORY:  -No cigarettes.  No marijuana no cocaine no intravenous drug use  HOME  EXPOSURE and HOBBY DETAILS :  -Lived in [place] since 2003.  Originally from [place].  Age of the current home is 60 years.  There is no dampness.   There is no mold.  Detail organic antigen exposure history is negative.  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  There is no mold exposure.  But back in [place] she was exposed to wood fire and also she was steaming iron clothes.  PULMONARY TOXICITY HISTORY (27 items):  X        CT Chest data from date: Cardiac CT scan of the chest April 18, 2023- personally visualized and independently interpreted : yes - my findings are:  -Motion artifact poor quality.  Possible groundglass opacities. DDENDUM REPORT: 04/23/2023 15:09   EXAM: OVER-READ INTERPRETATION  CT CHEST   The following report is an over-read performed by radiologist Dr. Elnoria Howard [facility] on 04/23/2023. This over-read does not include interpretation of cardiac or coronary anatomy or pathology. The cardiovascular interpretation by the cardiologist is attached.   COMPARISON:  None.   FINDINGS: Diffusely prominent interstitial markings in both lower lobes with mild underlying centrilobular and paraseptal bullous changes. Left upper lobe calcified granuloma. Otherwise, no lung nodules. No pleural fluid. Mild thoracic spine degenerative changes.   IMPRESSION: Mild emphysema with superimposed interstitial lung disease in the lower lobes. The interstitial prominence could be organic and antigen exposure history is negative pulmonary edema, interstitial pneumonitis or chronic interstitial lung disease.  Emphysema (ICD10-J43.9).     Electronically Signed   By: Beckie Salts M.D.   On: 04/23/2023 15:09  PFT      No data to display            LAB RESULTS last 96 hours No results found.  LAB RESULTS last 90 days No results found for this or any previous visit (from the past 2160 hour(s)).       has a past medical history of Arthritis, Diabetes mellitus without  complication (HCC), GERD (gastroesophageal reflux disease), and Hyperlipidemia.   reports that she has never smoked. She has never been exposed to tobacco smoke. She has never used smokeless tobacco.  Past Surgical History:  Procedure Laterality Date   NO PAST SURGERIES      No Known Allergies  Immunization History  Administered Date(s) Administered   Influenza Split 11/24/2013   Influenza,inj,Quad PF,6+ Mos 11/19/2014, 06/22/2019, 06/23/2020, 07/26/2022   PFIZER(Purple Top)SARS-COV-2 Vaccination 09/25/2020   Tdap 06/22/2019   Zoster Recombinant(Shingrix) 07/26/2022, 10/26/2022    Family History  Problem Relation Age of Onset   Diabetes Mother    Cancer Father    Diabetes Brother    Breast cancer Neg Hx    Colon cancer Neg Hx    Colon polyps Neg Hx    Esophageal cancer Neg Hx    Rectal cancer Neg Hx    Stomach cancer Neg Hx      Current Outpatient Medications:    Accu-Chek Softclix Lancets lancets, Use in the morning, at noon, and at bedtime., Disp: 100 each, Rfl: 0   acetaminophen (TYLENOL) 500 MG tablet, Take 500 mg by mouth every 6 (six) hours as needed for moderate pain or headache., Disp: , Rfl:    aspirin EC 81 MG tablet, Take 81 mg by mouth daily. Swallow whole., Disp: , Rfl:    atorvastatin (LIPITOR) 20 MG tablet, Take 1 tablet (20 mg total) by mouth daily., Disp: 90 tablet, Rfl: 0   Blood Glucose Monitoring Suppl (ACCU-CHEK GUIDE) w/Device KIT, Use in the morning, at noon, and at bedtime., Disp: 1 kit, Rfl: 0   conjugated estrogens (PREMARIN) vaginal cream, Place 1 Applicatorful vaginally daily., Disp: 42.5 g, Rfl: 12   glipiZIDE (GLUCOTROL) 5 MG tablet, Take 1 tablet (5 mg total) by mouth daily before breakfast., Disp: 90 tablet, Rfl: 0   metFORMIN (GLUCOPHAGE) 500 MG tablet, Take 2 tablets (1,000 mg total) by mouth 2 (two) times daily with a meal., Disp: 360 tablet, Rfl: 0   omeprazole (PRILOSEC) 20 MG capsule, Take 1 capsule (20 mg total) by mouth daily., Disp:  30 capsule, Rfl: 1   tiZANidine (ZANAFLEX) 4 MG tablet, Take 1 tablet (4 mg total) by mouth 2 (two) times daily as needed for muscle spasms. (Patient not taking: Reported on 11/23/2022), Disp: 60 tablet, Rfl: 1   triamcinolone cream (KENALOG) 0.1 %, Apply 1 Application topically 2 (two) times daily. (Patient not taking: Reported on 03/22/2023), Disp: 60 g, Rfl: 0      Objective:   Vitals:   07/26/23 1525  BP: 110/74  Pulse: 71  SpO2: 96%  Weight: 178 lb (80.7 kg)  Height: 5\' 3"  (1.6 m)    Estimated body mass index is 31.53 kg/m as calculated from the following:   Height as of this encounter: 5\' 3"  (1.6 m).   Weight as of this encounter: 178 lb (80.7 kg).  @WEIGHTCHANGE @  [employer]   07/26/23 1525  Weight: 178 lb (80.7 kg)  Physical Exam   General: No distress. Loooks well O2 at rest: no Cane present: no Sitting in wheel chair: no Frail: no Obese: YES Neuro: Alert and Oriented x 3. GCS 15. Speech normal Psych: Pleasant Resp:  Barrel Chest - no.  Wheeze - no, Crackles - no, No overt respiratory distress CVS: Normal heart sounds. Murmurs - no Ext: Stigmata of Connective Tissue Disease - n SKIN: ? vitiligo HEENT: Normal upper airway. PEERL +. No post nasal drip        Assessment:       ICD-10-CM   1. Abnormal CT of the chest  R93.89 Pulmonary function test    CT Chest High Resolution    2. Upper back pain  M54.9     3. Spanish language interpreter needed  Z75.8          Plan:     Patient Instructions     ICD-10-CM   1. Abnormal CT of the chest  R93.89     2. Upper back pain  M54.9        Plan  - do HRCT supine and prone and inspitratory and expiratory volume -do full PFT  - talk to PCP Rema Fendt, NP about neck evaluation such has neurology referral or MRI neck  Followup  - return to see DR Marchelle Gearing or NP after tests   FOLLOWUP Return in about 8 weeks (around 09/20/2023) for 30 min visit, after HRCT chest, after Cleda Daub and  DLCO, with Dr Marchelle Gearing.    SIGNATURE    Dr. Kalman Shan, M.D., F.C.C.P,  Pulmonary and Critical Care Medicine Staff Physician, [facility] Center Director - Interstitial Lung Disease  Program  Pulmonary Fibrosis [facility] [place], [place], [address]  Pager: [phone], If no answer or between  15:00h - 7:00h: call [phone] Telephone: [phone]  5:21 PM 07/26/2023

## 2023-07-27 ENCOUNTER — Ambulatory Visit: Payer: Medicaid Other | Admitting: Family

## 2023-07-27 ENCOUNTER — Other Ambulatory Visit: Payer: Self-pay

## 2023-07-27 VITALS — BP 108/75 | HR 62 | Temp 98.5°F | Ht 62.0 in | Wt 177.2 lb

## 2023-07-27 DIAGNOSIS — Z23 Encounter for immunization: Secondary | ICD-10-CM | POA: Diagnosis not present

## 2023-07-27 DIAGNOSIS — Z0100 Encounter for examination of eyes and vision without abnormal findings: Secondary | ICD-10-CM | POA: Diagnosis not present

## 2023-07-27 DIAGNOSIS — E119 Type 2 diabetes mellitus without complications: Secondary | ICD-10-CM | POA: Diagnosis not present

## 2023-07-27 DIAGNOSIS — E785 Hyperlipidemia, unspecified: Secondary | ICD-10-CM | POA: Diagnosis not present

## 2023-07-27 DIAGNOSIS — Z758 Other problems related to medical facilities and other health care: Secondary | ICD-10-CM

## 2023-07-27 DIAGNOSIS — Z603 Acculturation difficulty: Secondary | ICD-10-CM

## 2023-07-27 DIAGNOSIS — Z7984 Long term (current) use of oral hypoglycemic drugs: Secondary | ICD-10-CM

## 2023-07-27 LAB — POCT GLYCOSYLATED HEMOGLOBIN (HGB A1C): HbA1c, POC (controlled diabetic range): 6.5 % (ref 0.0–7.0)

## 2023-07-27 MED ORDER — ATORVASTATIN CALCIUM 20 MG PO TABS
20.0000 mg | ORAL_TABLET | Freq: Every day | ORAL | 0 refills | Status: DC
  Filled 2023-07-27 – 2023-08-22 (×2): qty 90, 90d supply, fill #0

## 2023-07-27 MED ORDER — METFORMIN HCL 500 MG PO TABS
1000.0000 mg | ORAL_TABLET | Freq: Two times a day (BID) | ORAL | 0 refills | Status: DC
  Filled 2023-07-27 – 2023-08-22 (×2): qty 360, 90d supply, fill #0

## 2023-07-27 MED ORDER — GLIPIZIDE 5 MG PO TABS
5.0000 mg | ORAL_TABLET | Freq: Every day | ORAL | 0 refills | Status: DC
  Filled 2023-07-27 – 2023-08-22 (×2): qty 90, 90d supply, fill #0

## 2023-07-27 NOTE — Progress Notes (Signed)
Patient wants to know when her menopause symptoms go away.  Stated she doesn't have a period anymore and wants to know if she can still become pregnant.   Patient wants Flu vaccine.

## 2023-07-27 NOTE — Progress Notes (Signed)
Patient ID: Elaine Burch, female    DOB: 1966/09/05  MRN: [id number]  CC: Chronic Conditions Follow-Up  Subjective: Elaine Burch is a 57 y.o. female who presents for chronic conditions follow-up.   Her concerns today include:  - Doing well on Metformin and Glipizide, no issues/concerns. She denies red flag symptoms associated with diabetes.  - Doing well on Atorvastatin, no issues/concerns.  - Established with Gynecology for women's health maintenance/screenings.   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 100 each 0   acetaminophen (TYLENOL) 500 MG tablet Take 500 mg by mouth every 6 (six) hours as needed for moderate pain or headache.     Blood Glucose Monitoring Suppl (ACCU-CHEK GUIDE) w/Device KIT Use in the morning, at noon, and at bedtime. 1 kit 0   No current facility-administered medications on file prior to visit.    No Known Allergies  Social History   Socioeconomic History   Marital status: Married    Spouse name: Not on file   Number of children: 4   Years of education: Not on file   Highest education level: 6th grade  Occupational History   Not on file  Tobacco Use   Smoking status: Never    Passive exposure: Never   Smokeless tobacco: Never  Vaping Use   Vaping status: Never Used  Substance and Sexual Activity   Alcohol use: No   Drug use: No   Sexual  activity: Yes    Birth control/protection: Post-menopausal  Other Topics Concern   Not on file  Social History Narrative   Not on file   Social Determinants of Health   Financial Resource Strain: Not on file  Food Insecurity: Food Insecurity Present (11/23/2022)   Hunger Vital Sign    Worried About Running Out of Food in the Last Year: Sometimes true    Ran Out of Food in the Last Year: Sometimes true  Transportation Needs: No Transportation Needs (11/23/2022)   PRAPARE - Administrator, Civil Service (Medical): No    Lack of Transportation (Non-Medical): No  Physical Activity: Inactive (11/07/2018)   Exercise Vital Sign    Days of Exercise per Week: 0 days    Minutes of Exercise per Session: 0 min  Stress: No Stress Concern Present (11/07/2018)   [employer] of Occupational Health - Occupational Stress Questionnaire    Feeling of Stress : Only a little  Social Connections: Unknown (02/16/2022)   [facility] from [facility]   Social Network    Social Network: Not on file  Intimate Partner Violence: Unknown (01/07/2022)   [facility], [facility]   HITS    Physically Hurt: Not on file    Insult or Talk Down To: Not on file    Threaten Physical Harm: Not on file    Scream or Curse: Not on file    Family History  Problem  Relation Age of Onset   Diabetes Mother    Cancer Father    Diabetes Brother    Breast cancer Neg Hx    Colon cancer Neg Hx    Colon polyps Neg Hx    Esophageal cancer Neg Hx    Rectal cancer Neg Hx    Stomach cancer Neg Hx     Past Surgical History:  Procedure Laterality Date   NO PAST SURGERIES      ROS: Review of Systems Negative except as stated above  PHYSICAL EXAM: BP 108/75   Pulse 62   Temp 98.5 F (36.9 C) (Oral)   Ht 5\' 2"  (1.575 m)   Wt 177 lb 3.2 oz (80.4 kg)   SpO2 93%   BMI 32.41 kg/m   Physical Exam HENT:     Head: Normocephalic and atraumatic.     Nose: Nose normal.      Mouth/Throat:     Mouth: Mucous membranes are moist.     Pharynx: Oropharynx is clear.  Eyes:     Extraocular Movements: Extraocular movements intact.     Conjunctiva/sclera: Conjunctivae normal.     Pupils: Pupils are equal, round, and reactive to light.  Cardiovascular:     Rate and Rhythm: Normal rate and regular rhythm.     Pulses: Normal pulses.     Heart sounds: Normal heart sounds.  Pulmonary:     Effort: Pulmonary effort is normal.     Breath sounds: Normal breath sounds.  Musculoskeletal:        General: Normal range of motion.     Cervical back: Normal range of motion and neck supple.  Neurological:     General: No focal deficit present.     Mental Status: She is alert and oriented to person, place, and time.  Psychiatric:        Mood and Affect: Mood normal.        Behavior: Behavior normal.     ASSESSMENT AND PLAN: 1. Type 2 diabetes mellitus without complication, without long-term current use of insulin (HCC) - Hemoglobin A1c at goal at 6.5%, goal 7%.  - Continue Metformin and Glipizide as prescribed. Counseled on medication adherence/adverse effects.  - Routine screening.  - Discussed the importance of healthy eating habits, low-carbohydrate diet, low-sugar diet, regular aerobic exercise (at least 150 minutes a week as tolerated) and medication compliance to achieve or maintain control of diabetes. - Follow-up with primary provider in 3 months or sooner if needed.  - POCT glycosylated hemoglobin (Hb A1C) - Microalbumin / creatinine urine ratio - Basic Metabolic Panel - glipiZIDE (GLUCOTROL) 5 MG tablet; Take 1 tablet (5 mg total) by mouth daily before breakfast.  Dispense: 90 tablet; Refill: 0 - metFORMIN (GLUCOPHAGE) 500 MG tablet; Take 2 tablets (1,000 mg total) by mouth 2 (two) times daily with a meal.  Dispense: 360 tablet; Refill: 0  2. Diabetic eye exam [facility]) - Referral to Ophthalmology for evaluation/management. - Ambulatory referral to  Ophthalmology  3. Encounter for diabetic foot exam [facility]) - Referral to Podiatry for evaluation/management. - Ambulatory referral to Podiatry  4. Hyperlipidemia, unspecified hyperlipidemia type - Continue Atorvastatin as prescribed. Counseled on medication adherence/adverse effects.  - Keep all scheduled appointments with Cardiology. - Follow-up with primary provider as scheduled. - atorvastatin (LIPITOR) 20 MG tablet; Take 1 tablet (20 mg total) by mouth daily.  Dispense: 90 tablet; Refill: 0  5. Language barrier - [facility] [employer]. Name: Sullivan Lone  ID#: [id number]  Patient was given the opportunity to ask questions.  Patient verbalized understanding of the plan and was able to repeat key elements of the plan. Patient was given clear instructions to go to Emergency Department or return to medical center if symptoms don't improve, worsen, or new problems develop.The patient verbalized understanding.   Orders Placed This Encounter  Procedures   Microalbumin / creatinine urine ratio   Basic Metabolic Panel   Ambulatory referral to Podiatry   Ambulatory referral to Ophthalmology   POCT glycosylated hemoglobin (Hb A1C)     Requested Prescriptions   Signed Prescriptions Disp Refills   atorvastatin (LIPITOR) 20 MG tablet 90 tablet 0    Sig: Take 1 tablet (20 mg total) by mouth daily.   glipiZIDE (GLUCOTROL) 5 MG tablet 90 tablet 0    Sig: Take 1 tablet (5 mg total) by mouth daily before breakfast.   metFORMIN (GLUCOPHAGE) 500 MG tablet 360 tablet 0    Sig: Take 2 tablets (1,000 mg total) by mouth 2 (two) times daily with a meal.    Return in about 3 months (around 10/27/2023) for Follow-Up or next available chronic conditions.  Rema Fendt, NP

## 2023-07-28 LAB — BASIC METABOLIC PANEL
BUN/Creatinine Ratio: 24 — ABNORMAL HIGH (ref 9–23)
BUN: 11 mg/dL (ref 6–24)
CO2: 21 mmol/L (ref 20–29)
Calcium: 10.1 mg/dL (ref 8.7–10.2)
Chloride: 105 mmol/L (ref 96–106)
Creatinine, Ser: 0.46 mg/dL — ABNORMAL LOW (ref 0.57–1.00)
Glucose: 121 mg/dL — ABNORMAL HIGH (ref 70–99)
Potassium: 4.2 mmol/L (ref 3.5–5.2)
Sodium: 143 mmol/L (ref 134–144)
eGFR: 113 mL/min/{1.73_m2} (ref >59)

## 2023-07-28 LAB — MICROALBUMIN / CREATININE URINE RATIO
Creatinine, Urine: 133.9 mg/dL
Microalb/Creat Ratio: 18 mg/g{creat} (ref 0–29)
Microalbumin, Urine: 24.2 ug/mL

## 2023-08-02 ENCOUNTER — Other Ambulatory Visit: Payer: Self-pay | Admitting: Family

## 2023-08-02 ENCOUNTER — Telehealth: Payer: Self-pay | Admitting: Family

## 2023-08-02 DIAGNOSIS — Z1231 Encounter for screening mammogram for malignant neoplasm of breast: Secondary | ICD-10-CM

## 2023-08-02 DIAGNOSIS — N644 Mastodynia: Secondary | ICD-10-CM

## 2023-08-02 NOTE — Telephone Encounter (Signed)
Complete

## 2023-08-02 NOTE — Telephone Encounter (Signed)
Copied from CRM [id number]. Topic: Referral - Request for Referral >> Aug 02, 2023  2:01 PM Phill Myron wrote: ..pI need a referral for a diagnostic mammogram.. due to my breast pain   Please advise

## 2023-08-04 ENCOUNTER — Other Ambulatory Visit: Payer: Self-pay | Admitting: Family

## 2023-08-04 DIAGNOSIS — N644 Mastodynia: Secondary | ICD-10-CM

## 2023-08-15 ENCOUNTER — Ambulatory Visit
Admission: RE | Admit: 2023-08-15 | Discharge: 2023-08-15 | Disposition: A | Discharge status: Home / Self Care | Payer: Medicaid Other | Source: Ambulatory Visit | Attending: Internal Medicine | Admitting: Internal Medicine

## 2023-08-15 DIAGNOSIS — R9389 Abnormal findings on diagnostic imaging of other specified body structures: Secondary | ICD-10-CM

## 2023-08-22 ENCOUNTER — Other Ambulatory Visit: Payer: Self-pay

## 2023-08-22 ENCOUNTER — Encounter (INDEPENDENT_AMBULATORY_CARE_PROVIDER_SITE_OTHER): Payer: Medicaid Other | Admitting: Podiatry

## 2023-08-22 DIAGNOSIS — Z91199 Patient's noncompliance with other medical treatment and regimen due to unspecified reason: Secondary | ICD-10-CM

## 2023-08-22 NOTE — Progress Notes (Signed)
Patient was a no-show for her scheduled new patient appointment today

## 2023-09-08 ENCOUNTER — Ambulatory Visit: Payer: Medicaid Other

## 2023-09-08 ENCOUNTER — Ambulatory Visit
Admission: RE | Admit: 2023-09-08 | Discharge: 2023-09-08 | Disposition: A | Discharge status: Home / Self Care | Payer: Medicaid Other | Source: Ambulatory Visit | Attending: Family | Admitting: Family

## 2023-09-08 DIAGNOSIS — N644 Mastodynia: Secondary | ICD-10-CM

## 2023-09-13 NOTE — Progress Notes (Signed)
No evidence of interstitial lung disease. 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   Electronically Signed   By: Leanna Battles M.D.   On: 09/02/2023 10:26   Will discuss at follow-up

## 2023-09-14 ENCOUNTER — Encounter: Payer: Self-pay | Admitting: Family

## 2023-09-26 ENCOUNTER — Other Ambulatory Visit: Payer: Self-pay | Admitting: Family

## 2023-09-26 ENCOUNTER — Other Ambulatory Visit: Payer: Self-pay

## 2023-09-26 DIAGNOSIS — E119 Type 2 diabetes mellitus without complications: Secondary | ICD-10-CM

## 2023-09-26 MED ORDER — GLUCOSE BLOOD VI STRP
1.0000 | ORAL_STRIP | Freq: Three times a day (TID) | 0 refills | Status: DC
  Filled 2023-09-26: qty 100, 34d supply, fill #0

## 2023-09-26 MED ORDER — ACCU-CHEK SOFTCLIX LANCETS MISC
1.0000 | Freq: Three times a day (TID) | 0 refills | Status: DC
  Filled 2023-09-26: qty 100, 34d supply, fill #0

## 2023-09-27 ENCOUNTER — Other Ambulatory Visit: Payer: Self-pay

## 2023-09-29 ENCOUNTER — Other Ambulatory Visit (HOSPITAL_COMMUNITY): Payer: Self-pay

## 2023-09-29 ENCOUNTER — Other Ambulatory Visit: Payer: Self-pay

## 2023-10-25 ENCOUNTER — Encounter: Payer: Self-pay | Admitting: Primary Care

## 2023-10-25 ENCOUNTER — Ambulatory Visit: Payer: Medicaid Other | Admitting: Primary Care

## 2023-10-25 ENCOUNTER — Ambulatory Visit: Payer: Medicaid Other | Admitting: Internal Medicine

## 2023-10-25 ENCOUNTER — Telehealth: Payer: Self-pay | Admitting: Primary Care

## 2023-10-25 VITALS — BP 108/74 | HR 74 | Temp 98.4°F | Ht 62.0 in | Wt 196.0 lb

## 2023-10-25 DIAGNOSIS — R9389 Abnormal findings on diagnostic imaging of other specified body structures: Secondary | ICD-10-CM

## 2023-10-25 DIAGNOSIS — H1045 Other chronic allergic conjunctivitis: Secondary | ICD-10-CM

## 2023-10-25 DIAGNOSIS — J4520 Mild intermittent asthma, uncomplicated: Secondary | ICD-10-CM

## 2023-10-25 DIAGNOSIS — J984 Other disorders of lung: Secondary | ICD-10-CM

## 2023-10-25 LAB — PULMONARY FUNCTION TEST
DL/VA % pred: 126 %
DL/VA: 5.46 ml/min/mmHg/L
DLCO cor % pred: 124 %
DLCO cor: 24.05 ml/min/mmHg
DLCO unc % pred: 124 %
DLCO unc: 24.05 ml/min/mmHg
FEF 25-75 Post: 4.43 L/s
FEF 25-75 Pre: 2.86 L/s
FEF2575-%Change-Post: 54 %
FEF2575-%Pred-Post: 181 %
FEF2575-%Pred-Pre: 116 %
FEV1-%Change-Post: 12 %
FEV1-%Pred-Post: 111 %
FEV1-%Pred-Pre: 98 %
FEV1-Post: 2.78 L
FEV1-Pre: 2.47 L
FEV1FVC-%Change-Post: 3 %
FEV1FVC-%Pred-Pre: 105 %
FEV6-%Change-Post: 8 %
FEV6-%Pred-Post: 103 %
FEV6-%Pred-Pre: 95 %
FEV6-Post: 3.19 L
FEV6-Pre: 2.95 L
FEV6FVC-%Pred-Post: 103 %
FEV6FVC-%Pred-Pre: 103 %
FVC-%Change-Post: 8 %
FVC-%Pred-Post: 99 %
FVC-%Pred-Pre: 92 %
FVC-Post: 3.19 L
FVC-Pre: 2.95 L
Post FEV1/FVC ratio: 87 %
Post FEV6/FVC ratio: 100 %
Pre FEV1/FVC ratio: 84 %
Pre FEV6/FVC Ratio: 100 %
RV % pred: 91 %
RV: 1.64 L
TLC % pred: 104 %
TLC: 4.98 L

## 2023-10-25 NOTE — Patient Instructions (Signed)
Full PFT performed today. °

## 2023-10-25 NOTE — Progress Notes (Signed)
Full PFT performed today. °

## 2023-10-25 NOTE — Telephone Encounter (Signed)
HRCT showed no ILD or emphysema. Mild air trapping. Lung function was normal, she had no restrictive or obstructive defects. She did had positive bronchodilator response, FEV1 improved 12% after given Albuterol. She is completely asymptomatic respiratory wise. I did not recommend any changes to plan of care, do not think she needs maintenance BD regimen. She will return if she develops any respiratory symptoms.

## 2023-10-25 NOTE — Patient Instructions (Addendum)
VISIT SUMMARY:  Today, we reviewed your recent breathing test and discussed your current health concerns. You reported no breathing issues but mentioned a sharp pain in your back and occasional watery eyes. We also reviewed your previous CT scan results.  YOUR PLAN:  -MILD ASTHMA: Mild asthma is a condition where the airways occasionally become inflamed, causing difficulty in breathing. Your recent tests showed mild air trapping and some improvement with a bronchodilator, but you currently have no symptoms. No changes to your current management are needed. If you develop symptoms like shortness of breath, wheezing, chest tightness, or cough, please return for reassessment.  -ALLERGIC CONJUNCTIVITIS: Allergic conjunctivitis is an eye inflammation caused by allergic reactions to substances like pollen or dust. You reported occasional itchy eyes, which can be managed with over-the-counter allergy eye drops. If your symptoms persist or worsen, we may consider allergy testing or oral antihistamines.  INSTRUCTIONS:  If you experience increased nasal congestion, please follow up with your primary care provider. If you develop chest-related symptoms such as shortness of breath, cough, or wheezing, return to our clinic for further evaluation.  _______________________________________________________________________   Dow Adolph su prueba de respiracin reciente y discutimos sus preocupaciones de salud actuales. No report problemas respiratorios, pero mencion un dolor agudo en la espalda y ojos llorosos ocasionales. Tambin revisamos los resultados de sus tomografas computarizadas anteriores.  SU PLAN:  -ASMA LEVA: El asma leve es una afeccin en la que las vas respiratorias se inflaman ocasionalmente, causando dificultad para respirar. Sus pruebas recientes mostraron un atrapamiento de aire leve y cierta mejora con un broncodilatador, pero actualmente no tiene sntomas. No es necesario Insurance underwriter actual. Si desarrolla sntomas como dificultad para respirar, sibilancias, opresin en el pecho o tos, regrese para una reevaluacin.  CONJUNTIVITIS ALRGICA: La conjuntivitis alrgica es una inflamacin ocular causada por reacciones alrgicas a sustancias como el polen o el polvo. Report picazn ocasional en los ojos, que se puede controlar con gotas oftlmicas para la alergia de [place]. Si sus sntomas persisten o empeoran, podemos considerar pruebas de alergia o antihistamnicos orales.    INSTRUCCIONES: Si experimenta un aumento de la congestin nasal, consulte a su proveedor de Marine scientist. Si desarrolla sntomas relacionados con el pecho, como dificultad para respirar, tos o sibilancias, regrese a nuestra clnica para una evaluacin adicional.   Asma en los adultos Asthma, Adult  El asma es una enfermedad prolongada (crnica) que causa episodios recurrentes de endurecimiento y Scientist, research (medical) de las vas respiratorias inferiores en los pulmones. El estrechamiento es causado por la inflamacin y el endurecimiento del msculo liso que rodea las vas respiratorias inferiores. Los episodios de asma, tambin llamados ataques de asma o exacerbaciones de asma, pueden causar tos, falta de aliento, dolor en el pecho y que la persona emita silbidos agudos al respirar, sobre todo al exhalar (sibilancias). Las vas respiratorias pueden producir mucosidad adicional causada por la inflamacin y Youth worker. Durante el ataque, puede ser difcil respirar. Los ataques de asma pueden ser desde leves hasta potencialmente mortales. El asma no puede curarse, pero los [employer] y los cambios en el estilo de vida lo ayudarn a Scientist, physiological enfermedad y a Proofreader ataques agudos. Es importante mantener el asma bien controlado para que la afeccin no interfiera con su vida diaria. Cules son las causas? Se cree que la causa de esta afeccin son factores hereditarios  (genticos) y la exposicin a factores ambientales; sin embargo, su causa exacta se desconoce. Qu cosas pueden  desencadenar un ataque de asma? Muchas cosas pueden provocar un ataque de asma o intensificar los sntomas. Estos desencadenantes son diferentes para cada persona. Los factores desencadenantes comunes [employer] siguientes: Alrgenos e irritantes como moho, polvo, caspa de [place], [place], [place], contaminacin del aire y olores qumicos. Humo del cigarrillo. Cambios climticos y [place] fro. El estrs y las respuestas emocionales fuertes, como llorar o rer intensamente. Ciertos medicamentos, como la aspirina o los betabloqueantes. Enfermedades infecciosas e inflamatorias, como gripe, resfro, neumona o inflamacin de las [employer] (rinitis). Enfermedad de reflujo gastroesofgico (ERGE). Cules son los signos o los sntomas? Los sntomas pueden aparecer inmediatamente despus de la exposicin a un desencadenante del asma u horas ms tarde, y pueden variar segn la persona. Entre los signos y los sntomas ms frecuentes, se incluyen los siguientes: Sibilancias. Dificultad para respirar (falta de aire). Tos excesiva durante la noche o temprano por la maana. Opresin en el pecho. Cansancio (fatiga) al realizar actividades que requieren un mnimo esfuerzo. Dificultad para enunciar oraciones completas. Escasa tolerancia a los ejercicios. Cmo se diagnostica? Esta afeccin se diagnostica en funcin de lo siguiente: Un examen fsico y los antecedentes mdicos. Estudios, entre ellos, los siguientes: Estudios de la funcin pulmonar para evaluar el flujo de aire en los pulmones. Pruebas de alergia. Estudios de diagnstico por imgenes, como radiografas. Cmo se trata? No hay cura, pero el tratamiento adecuado puede [employer]. Por lo general, el tratamiento implica lo siguiente: Identificar y Automotive engineer los factores desencadenantes del asma. Medicamentos  inhalados. Se utilizan [employer] tipos para tratar el asma, dependiendo de la gravedad: Medicamentos de control. Estos ayudan a Transport planner aparicin de los sntomas de asma. Se toman [employer]. Medicamentos de [place] o de rescate de accin rpida. Estos alivian los sntomas de asma rpidamente. Se utilizan cuando es necesario y proporcionan alivio a Product manager. Usar otros medicamentos, como los siguientes: Medicamentos para las [place], tales como antihistamnicos, en caso de que sus ataques de asma sean causados por alrgenos. Medicamentos inmunolgicos (inmunomoduladores). Estos medicamentos controlan el sistema inmunitario. Usar oxgeno complementario. Puede que esto sea necesario solamente durante un episodio grave. Crear un plan de accin para el asma. Esto es una planificacin por escrito para el control y el tratamiento de los ataques de asma. Este plan incluye lo siguiente: Una lista de los factores desencadenantes del asma y el modo de evitarlos. Informacin sobre el momento en que se deben tomar los medicamentos y cundo hay que cambiar las dosis. Instrucciones sobre el uso de un dispositivo llamado espirmetro. El espirmetro es un dispositivo que mide el funcionamiento de los pulmones y la gravedad de su asma. Lo ayuda a controlar la enfermedad. Siga estas instrucciones en su casa: Use los medicamentos de venta libre y los recetados solamente como se lo haya indicado el mdico. Mantngase al da con todas las vacunas que le haya recomendado su mdico, incluidas las vacunas contra la gripe y Scientist, research (physical sciences) neumona. Utilice un espirmetro y lleve un registro de sus lecturas de flujo mximo. Entienda y use el plan de accin para el asma a fin de abordar las exacerbaciones del asma. No fume ni permita que nadie fume en su casa. Comunquese con un mdico si: Tiene sibilancias, le falta el aire o tiene tos que no mejoran con los [employer]. Los [employer] causan efectos  secundarios, como erupcin cutnea, picazn, hinchazn o dificultad para respirar. Necesita tomar un medicamento de alivio ms de 2 a 3 veces por semana. Su flujo  mximo se mantiene entre el 50 % y el 79 % de su mejor valor personal despus de seguir el plan de accin durante 1 hora. Tiene fiebre y Company secretary. Solicite ayuda de inmediato si: Est empeorando y no responde al tratamiento durante un ataque de asma. Le falta el aire cuando descansa o cuando hace muy poca actividad fsica. Tiene dificultad para comer, beber o hablar. Siente dolor u opresin en el pecho. Tiene latidos cardacos rpidos o palpitaciones. Tiene los labios o las uas de un tono [place]. Siente que est por desvanecerse, est mareado o se desmaya. Su flujo mximo es menor que el 50 % del Arts administrator personal. Se siente demasiado cansado para respirar con normalidad. Estos sntomas pueden Customer service manager. Solicite ayuda de inmediato. Llame al 911. No espere a ver si los sntomas desaparecen. No conduzca por sus propios medios [employer]. Resumen El asma es una enfermedad prolongada (crnica) que causa episodios recurrentes de estrechamiento de las vas respiratorias. Los episodios de asma, tambin denominados ataques de asma o exacerbaciones de asma, pueden provocar tos, sibilancias, falta de aire y Journalist, newspaper. El asma no es curable, pero los medicamentos y los cambios en el estilo de vida pueden ayudar a Chief Operating Officer bien la enfermedad y a Automotive engineer las exacerbaciones del asma. Asegrese de comprender cmo evitar los desencadenantes y cmo y [employer]. Los ataques de asma pueden ser desde leves hasta potencialmente mortales. Consiga ayuda de inmediato si tiene un ataque de asma y no responde al tratamiento con los medicamentos de rescate habituales. Esta informacin no tiene Theme park manager el consejo del mdico. Asegrese de hacerle al mdico cualquier pregunta que  tenga. Document Revised: 07/10/2021 Document Reviewed: 07/10/2021 Elsevier Patient Education  2024 [employer].

## 2023-10-25 NOTE — Progress Notes (Signed)
@Patient  ID: Elaine Burch, female    DOB: [birth year], 58 y.o.   MRN: [id number]  Chief Complaint  Patient presents with   Follow-up    Referring provider: Rema Fendt, NP  HPI: 58 year old female, never smoked. PMH significant for vertebral artery dissection, type 2 diabetes, hyperlipidemia, small airway disease.   10/25/2023  Discussed the use of AI scribe software for clinical note transcription with the patient, who gave verbal consent to proceed.  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 She is spanish speaking and is accompanied by an interpretor. She was originally seen back in October by [place]. Previous Cardiac CT showed mild emphysema and scarring, leading to a referral for a high-resolution CT scan and a breathing test.  She presents today to review testing results   She reports no current breathing issues or difficulty. She denies any shortness of breath, cough, wheezing, or chest tightness. The patient's primary concern is a constant, sharp pain in the right side of her back, which she believes is around the lung area. She describes having to lean on a wall to apply pressure to the area for relief when the pain is at its strongest. She also reports occasional watery eyes. She does not take allergy medication. No associated post nasal drip symptoms.    Patient had a high-resolution CAT scan in November 2024 that showed no evidence of interstitial lung disease or emphysema.  She was noted to have mild air trapping indicative of small airway disease as well as degenerative changes in the spine.  Pulmonary function testing today showed normal lung volumes with positive bronchodilator response.  Increased diffusion capacity.  Pulmonary function testing 10/25/2023 >> FVC 3.19 (99%), FEV1 2.78 (111%), ratio 87, TLC 104%, DLCO 24.05 (124%)   No  Known Allergies  Immunization History  Administered Date(s) Administered   Influenza Split 11/24/2013   Influenza, Seasonal, Injecte, Preservative Fre 07/27/2023   Influenza,inj,Quad PF,6+ Mos 11/19/2014, 06/22/2019, 06/23/2020, 07/26/2022   PFIZER(Purple Top)SARS-COV-2 Vaccination 09/25/2020   Tdap 06/22/2019   Zoster Recombinant(Shingrix) 07/26/2022, 10/26/2022    Past Medical History:  Diagnosis Date   Arthritis    beginning   Diabetes mellitus without complication (HCC)    GERD (gastroesophageal reflux disease)    past hx of GERD   Hyperlipidemia     Tobacco History: Social History   Tobacco Use  Smoking Status Never   Passive exposure: Never  Smokeless Tobacco Never   Counseling given: Not Answered   Outpatient Medications Prior to Visit  Medication Sig Dispense Refill   Accu-Chek Softclix Lancets lancets Use in the morning, at noon, and at bedtime. 100 each 0   acetaminophen (TYLENOL) 500 MG tablet Take 500 mg by mouth every 6 (six) hours as needed for moderate pain or headache.     atorvastatin (LIPITOR) 20 MG tablet Take 1 tablet (20 mg total) by mouth daily. 90 tablet 0   Blood Glucose Monitoring Suppl (ACCU-CHEK GUIDE) w/Device KIT Use in the morning, at noon, and at bedtime. 1 kit 0   glipiZIDE (GLUCOTROL) 5 MG tablet Take 1 tablet (5 mg total) by mouth daily before breakfast. 90 tablet 0   glucose blood test strip Test in the morning, at noon, and at bedtime. 100 each 0   metFORMIN (GLUCOPHAGE) 500 MG tablet Take 2 tablets (1,000 mg total) by mouth 2 (two)  times daily with a meal. 360 tablet 0   Pseudoephedrine-Acetaminophen (SM NON-ASPRIN SINUS PO) Take by mouth.     No facility-administered medications prior to visit.   Review of Systems  Review of Systems  Constitutional: Negative.   HENT: Negative.    Respiratory:  Negative for cough, shortness of breath and wheezing.    Physical Exam  BP 108/74 (BP Location: Left Arm, Patient Position: Sitting,  Cuff Size: Normal)   Pulse 74   Temp 98.4 F (36.9 C) (Oral)   Ht 5\' 2"  (1.575 m)   Wt 196 lb (88.9 kg)   SpO2 91%   BMI 35.85 kg/m  Physical Exam Constitutional:      Appearance: Normal appearance.  HENT:     Head: Normocephalic and atraumatic.  Cardiovascular:     Rate and Rhythm: Normal rate and regular rhythm.  Pulmonary:     Effort: Pulmonary effort is normal.     Breath sounds: Normal breath sounds.  Skin:    General: Skin is warm.  Neurological:     Mental Status: She is alert.      Lab Results:  CBC    Component Value Date/Time   WBC 6.0 10/27/2021 0500   RBC 4.40 10/27/2021 0500   HGB 14.0 10/27/2021 0500   HGB 13.9 01/15/2021 1126   HCT 40.7 10/27/2021 0500   HCT 41.1 01/15/2021 1126   PLT 184 10/27/2021 0500   PLT 187 01/15/2021 1126   MCV 92.5 10/27/2021 0500   MCV 92 01/15/2021 1126   MCH 31.8 10/27/2021 0500   MCHC 34.4 10/27/2021 0500   RDW 13.1 10/27/2021 0500   RDW 13.1 01/15/2021 1126   LYMPHSABS 2.2 10/26/2021 0238   LYMPHSABS 2.0 01/15/2021 1126   MONOABS 0.4 10/26/2021 0238   EOSABS 0.1 10/26/2021 0238   EOSABS 0.1 01/15/2021 1126   BASOSABS 0.1 10/26/2021 0238   BASOSABS 0.0 01/15/2021 1126    BMET    Component Value Date/Time   NA 143 07/27/2023 0849   K 4.2 07/27/2023 0849   CL 105 07/27/2023 0849   CO2 21 07/27/2023 0849   GLUCOSE 121 (H) 07/27/2023 0849   GLUCOSE 204 (H) 10/26/2021 0252   BUN 11 07/27/2023 0849   CREATININE 0.46 (L) 07/27/2023 0849   CREATININE 0.43 (L) 11/26/2013 0903   CALCIUM 10.1 07/27/2023 0849   GFRNONAA >60 10/26/2021 0238   GFRNONAA >89 11/26/2013 0903   GFRAA 125 03/14/2020 1459   GFRAA >89 11/26/2013 0903    BNP No results found for: "BNP"  ProBNP No results found for: "PROBNP"  Imaging: No results found.   Assessment & Plan:   1. Small airways disease (Primary)     Mild asthma  Patient is asymptomatic with mild air trapping noted on high-resolution CT scan and some  reversibility after bronchodilator use during pulmonary function test. No current respiratory symptoms reported. -No change in current management.  -If symptoms of shortness of breath, wheezing, chest tightness, or cough develop, patient should return for reassessment.  Allergic Conjunctivitis Reports occasional itchy eyes, potentially related to environmental allergens. No associated PND or upper respiratory symptoms  -Recommend over-the-counter allergy eye drops as needed. -If symptoms persist or progress, consider allergy testing and/or oral antihistamines.  Back pain - Appears muscular skeletal in nature. No improvement after SABA during pulmonary function testing. Patient noted to have degenerative changes to her spine on CT imagine. Recommend patient follow up with primary care regarding back pain.   Follow-up If symptoms progress  above the neck (e.g., increased nasal congestion), follow-up with primary care provider. If symptoms are chest-related (e.g., shortness of breath, cough, wheezing), return to our clinic.       Glenford Bayley, NP 10/25/2023

## 2023-10-26 ENCOUNTER — Encounter: Payer: Self-pay | Admitting: Family

## 2023-10-26 ENCOUNTER — Other Ambulatory Visit: Payer: Self-pay

## 2023-10-26 ENCOUNTER — Ambulatory Visit (INDEPENDENT_AMBULATORY_CARE_PROVIDER_SITE_OTHER): Payer: Medicaid Other | Admitting: Family

## 2023-10-26 VITALS — BP 107/72 | HR 65 | Temp 98.0°F | Ht 61.75 in | Wt 177.2 lb

## 2023-10-26 DIAGNOSIS — E785 Hyperlipidemia, unspecified: Secondary | ICD-10-CM

## 2023-10-26 DIAGNOSIS — E119 Type 2 diabetes mellitus without complications: Secondary | ICD-10-CM

## 2023-10-26 DIAGNOSIS — E1165 Type 2 diabetes mellitus with hyperglycemia: Secondary | ICD-10-CM

## 2023-10-26 DIAGNOSIS — Z7984 Long term (current) use of oral hypoglycemic drugs: Secondary | ICD-10-CM | POA: Diagnosis not present

## 2023-10-26 LAB — POCT GLYCOSYLATED HEMOGLOBIN (HGB A1C): HbA1c, POC (controlled diabetic range): 6.5 % (ref 0.0–7.0)

## 2023-10-26 MED ORDER — METFORMIN HCL 500 MG PO TABS
1000.0000 mg | ORAL_TABLET | Freq: Two times a day (BID) | ORAL | 0 refills | Status: DC
  Filled 2023-10-26 – 2023-11-16 (×2): qty 360, 90d supply, fill #0

## 2023-10-26 MED ORDER — ATORVASTATIN CALCIUM 20 MG PO TABS
20.0000 mg | ORAL_TABLET | Freq: Every day | ORAL | 0 refills | Status: DC
  Filled 2023-10-26 – 2023-11-16 (×2): qty 90, 90d supply, fill #0

## 2023-10-26 MED ORDER — GLIPIZIDE 5 MG PO TABS
5.0000 mg | ORAL_TABLET | Freq: Every day | ORAL | 0 refills | Status: DC
  Filled 2023-10-26 – 2023-11-16 (×2): qty 90, 90d supply, fill #0

## 2023-10-26 NOTE — Progress Notes (Signed)
Patient states she had two studies done I asked questions and didn't get a response to what the study was for.

## 2023-10-26 NOTE — Progress Notes (Signed)
Patient ID: Elaine Burch, female    DOB: [birth year]  MRN: [id number]  CC: Chronic Conditions Follow-Up  Subjective: Elaine Burch is a 58 y.o. female who presents for chronic conditions follow-up.   Her concerns today include:  - Doing well on Metformin and Glipizide, no issues/concerns. Denies red flag symptoms associated with diabetes.  - Doing well on Atorvastatin, no issues/concerns.  - States established with specialist for chronic back pain.  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 100 each 0   acetaminophen (TYLENOL) 500 MG tablet Take 500 mg by mouth every 6 (six) hours as needed for moderate pain or headache.     aspirin EC 81 MG tablet Take 81 mg by mouth daily. Swallow whole.     Blood Glucose Monitoring Suppl (ACCU-CHEK GUIDE) w/Device KIT Use in the morning, at noon, and at bedtime. 1 kit 0   glucose blood test strip Test in the morning, at noon, and at bedtime. 100 each 0   Pseudoephedrine-Acetaminophen (SM NON-ASPRIN SINUS PO) Take by mouth.     No current facility-administered medications on file prior to visit.    No Known Allergies  Social History   Socioeconomic History   Marital status: Married    Spouse name: Not on file   Number of children: 4   Years of education: Not on file   Highest education level: 6th grade  Occupational History   Not on file   Tobacco Use   Smoking status: Never    Passive exposure: Never   Smokeless tobacco: Never  Vaping Use   Vaping status: Never Used  Substance and Sexual Activity   Alcohol use: No   Drug use: No   Sexual activity: Yes    Birth control/protection: Post-menopausal  Other Topics Concern   Not on file  Social History Narrative   Not on file   Social Drivers of Health   Financial Resource Strain: Not on file  Food Insecurity: Food Insecurity Present (11/23/2022)   Hunger Vital Sign    Worried About Running Out of Food in the Last Year: Sometimes true    Ran Out of Food in the Last Year: Sometimes true  Transportation Needs: No Transportation Needs (11/23/2022)   PRAPARE - Administrator, Civil Service (Medical): No    Lack of Transportation (Non-Medical): No  Physical Activity: Inactive (11/07/2018)   Exercise Vital Sign    Days of Exercise per Week: 0 days    Minutes of Exercise per Session: 0 min  Stress: No Stress Concern Present (11/07/2018)   [employer] of Occupational Health - Occupational Stress Questionnaire    Feeling of Stress : Only a little  Social Connections: Unknown (02/16/2022)   Received from [facility]   Social Network    Social Network: Not on file  Intimate Partner Violence: Unknown (01/07/2022)   [facility]  HITS    Physically Hurt: Not on file    Insult or Talk Down To: Not on file    Threaten Physical Harm: Not on file    Scream or Curse: Not on file    Family History  Problem Relation Age of Onset   Diabetes Mother    Cancer Father    Diabetes Brother    Breast cancer Neg Hx    Colon cancer Neg Hx    Colon polyps Neg Hx    Esophageal cancer Neg Hx    Rectal cancer Neg Hx    Stomach cancer Neg Hx     Past Surgical History:  Procedure Laterality Date   NO PAST SURGERIES      ROS: Review of Systems Negative except as stated above  PHYSICAL EXAM: BP 107/72   Pulse 65    Temp 98 F (36.7 C) (Oral)   Ht 5' 1.75" (1.568 m)   Wt 177 lb 3.2 oz (80.4 kg)   SpO2 95%   BMI 32.67 kg/m   Physical Exam HENT:     Head: Normocephalic and atraumatic.     Nose: Nose normal.     Mouth/Throat:     Mouth: Mucous membranes are moist.     Pharynx: Oropharynx is clear.  Eyes:     Extraocular Movements: Extraocular movements intact.     Conjunctiva/sclera: Conjunctivae normal.     Pupils: Pupils are equal, round, and reactive to light.  Cardiovascular:     Rate and Rhythm: Normal rate and regular rhythm.     Pulses: Normal pulses.     Heart sounds: Normal heart sounds.  Pulmonary:     Effort: Pulmonary effort is normal.     Breath sounds: Normal breath sounds.  Musculoskeletal:        General: Normal range of motion.     Cervical back: Normal range of motion and neck supple.  Neurological:     General: No focal deficit present.     Mental Status: She is alert and oriented to person, place, and time.  Psychiatric:        Mood and Affect: Mood normal.        Behavior: Behavior normal.    Results for orders placed or performed in visit on 10/26/23  POCT glycosylated hemoglobin (Hb A1C)  Result Value Ref Range   Hemoglobin A1C     HbA1c POC (<> result, manual entry)     HbA1c, POC (prediabetic range)     HbA1c, POC (controlled diabetic range) 6.5 0.0 - 7.0 %    ASSESSMENT AND PLAN: 1. Type 2 diabetes mellitus with hyperglycemia, without long-term current use of insulin (HCC) (Primary) - Hemoglobin A1c at goal at 6.5%, goal 7%.  - Continue Metformin and Glipizide as prescribed. Counseled on medication adherence/adverse effects.  - Discussed the importance of healthy eating habits, low-carbohydrate diet, low-sugar diet, regular aerobic exercise (at least 150 minutes a week as tolerated) and medication compliance to achieve or maintain control of diabetes. - Follow-up with primary provider in 3 months or sooner if needed.  - POCT glycosylated hemoglobin (Hb  A1C) - glipiZIDE (GLUCOTROL) 5 MG tablet; Take 1 tablet (5 mg total) by mouth daily before breakfast.  Dispense: 90 tablet; Refill: 0 - metFORMIN (GLUCOPHAGE) 500 MG tablet; Take 2 tablets (1,000 mg total) by mouth 2 (two) times daily with a meal.  Dispense: 360 tablet; Refill: 0  2. Diabetic eye exam [facility] - Referral to Ophthalmology for evaluation/management. - Ambulatory  referral to Ophthalmology  3. Encounter for diabetic foot exam [facility]) - Referral to Podiatry for evaluation/management. - Ambulatory referral to Podiatry  4. Hyperlipidemia, unspecified hyperlipidemia type - Continue Atorvastatin as prescribed. Counseled on medication adherence/adverse effects.  - Follow-up with primary provider in 3 months or sooner if needed.  - atorvastatin (LIPITOR) 20 MG tablet; Take 1 tablet (20 mg total) by mouth daily.  Dispense: 90 tablet; Refill: 0    Patient was given the opportunity to ask questions.  Patient verbalized understanding of the plan and was able to repeat key elements of the plan. Patient was given clear instructions to go to Emergency Department or return to medical center if symptoms don't improve, worsen, or new problems develop.The patient verbalized understanding.   Orders Placed This Encounter  Procedures   Ambulatory referral to Ophthalmology   Ambulatory referral to Podiatry   POCT glycosylated hemoglobin (Hb A1C)     Requested Prescriptions   Signed Prescriptions Disp Refills   atorvastatin (LIPITOR) 20 MG tablet 90 tablet 0    Sig: Take 1 tablet (20 mg total) by mouth daily.   glipiZIDE (GLUCOTROL) 5 MG tablet 90 tablet 0    Sig: Take 1 tablet (5 mg total) by mouth daily before breakfast.   metFORMIN (GLUCOPHAGE) 500 MG tablet 360 tablet 0    Sig: Take 2 tablets (1,000 mg total) by mouth 2 (two) times daily with a meal.    Return in about 3 months (around 01/24/2024) for Follow-Up or next available chronic conditions.  Rema Fendt, NP

## 2023-10-27 ENCOUNTER — Ambulatory Visit: Payer: Medicaid Other | Admitting: Family

## 2023-10-27 NOTE — Telephone Encounter (Signed)
Sounds good. Thanks 

## 2023-11-09 ENCOUNTER — Ambulatory Visit (INDEPENDENT_AMBULATORY_CARE_PROVIDER_SITE_OTHER): Payer: Medicaid Other | Admitting: Podiatry

## 2023-11-09 ENCOUNTER — Encounter: Payer: Self-pay | Admitting: Podiatry

## 2023-11-09 DIAGNOSIS — E119 Type 2 diabetes mellitus without complications: Secondary | ICD-10-CM | POA: Diagnosis not present

## 2023-11-09 NOTE — Progress Notes (Signed)
  Subjective:  Patient ID: Elaine Burch, female    DOB: [birth year],   MRN: [id number]  Chief Complaint  Patient presents with   Nail Problem    DFC    58 y.o. female presents for diabetic foot check. Relates no major concerns. Does relates some tingling in her feet if seated for a long time and some swelling in ankles. Also some dark spots on the bottom of both feet. Denies any pain. Patient is diabetic and last A1c was  Lab Results  Component Value Date   HGBA1C 6.5 10/26/2023   .   PCP:  Lorren Greig PARAS, NP    . Denies any other pedal complaints. Denies n/v/f/c.   Past Medical History:  Diagnosis Date   Arthritis    beginning   Diabetes mellitus without complication (HCC)    GERD (gastroesophageal reflux disease)    past hx of GERD   Hyperlipidemia     Objective:  Physical Exam: Vascular: DP/PT pulses 2/4 bilateral. CFT <3 seconds. Absent hair growth on digits. Edema noted to bilateral lower extremities.  Skin. No lacerations or abrasions bilateral feet. Nails 1-5 bilateral are normal in appearance. Some very mild darkening to bilateral medial plantar arch likely from venous flow.  Musculoskeletal: MMT 5/5 bilateral lower extremities in DF, PF, Inversion and Eversion. Deceased ROM in DF of ankle joint.  Neurological: Sensation intact to light touch. Protective sensation intact bilateral.    Assessment:   1. Type 2 diabetes mellitus without complication, without long-term current use of insulin  (HCC)   2. Encounter for diabetic foot exam (HCC)      Plan:  Patient was evaluated and treated and all questions answered. -Discussed and educated patient on diabetic foot care, especially with  regards to the vascular, neurological and musculoskeletal systems.  -Stressed the importance of good glycemic control and the detriment of not  controlling glucose levels in relation to the foot. -Discussed supportive shoes at all times and checking feet regularly.   -Discussed trying some compression stockings for swelling  -Will monitor darker areas and advised to call if any changes.  -Answered all patient questions -Patient to return  in 1 year for DM foot check  -Patient advised to call the office if any problems or questions arise in the meantime.   Asberry Failing, DPM

## 2023-11-16 ENCOUNTER — Other Ambulatory Visit: Payer: Self-pay

## 2023-11-16 ENCOUNTER — Other Ambulatory Visit: Payer: Self-pay | Admitting: Family

## 2023-11-16 DIAGNOSIS — E119 Type 2 diabetes mellitus without complications: Secondary | ICD-10-CM

## 2023-11-16 MED ORDER — ACCU-CHEK SOFTCLIX LANCETS MISC
1.0000 | Freq: Three times a day (TID) | 0 refills | Status: DC
  Filled 2023-11-16: qty 100, 34d supply, fill #0

## 2023-11-16 MED ORDER — ACCU-CHEK GUIDE TEST VI STRP
1.0000 | ORAL_STRIP | Freq: Three times a day (TID) | 0 refills | Status: DC
  Filled 2023-11-16: qty 100, 34d supply, fill #0

## 2023-11-17 ENCOUNTER — Other Ambulatory Visit: Payer: Self-pay

## 2024-01-30 ENCOUNTER — Other Ambulatory Visit: Payer: Self-pay

## 2024-01-30 ENCOUNTER — Ambulatory Visit: Payer: Medicaid Other | Admitting: Family

## 2024-01-30 ENCOUNTER — Encounter: Payer: Self-pay | Admitting: Family

## 2024-01-30 VITALS — BP 124/80 | HR 62 | Temp 98.3°F | Resp 16 | Ht 61.5 in | Wt 175.4 lb

## 2024-01-30 DIAGNOSIS — J3089 Other allergic rhinitis: Secondary | ICD-10-CM

## 2024-01-30 DIAGNOSIS — E1165 Type 2 diabetes mellitus with hyperglycemia: Secondary | ICD-10-CM

## 2024-01-30 DIAGNOSIS — E785 Hyperlipidemia, unspecified: Secondary | ICD-10-CM

## 2024-01-30 DIAGNOSIS — Z7984 Long term (current) use of oral hypoglycemic drugs: Secondary | ICD-10-CM | POA: Diagnosis not present

## 2024-01-30 DIAGNOSIS — R350 Frequency of micturition: Secondary | ICD-10-CM

## 2024-01-30 DIAGNOSIS — E119 Type 2 diabetes mellitus without complications: Secondary | ICD-10-CM

## 2024-01-30 DIAGNOSIS — Z603 Acculturation difficulty: Secondary | ICD-10-CM

## 2024-01-30 LAB — POCT URINALYSIS DIP (CLINITEK)
Bilirubin, UA: NEGATIVE
Blood, UA: NEGATIVE
Glucose, UA: NEGATIVE mg/dL
Ketones, POC UA: NEGATIVE mg/dL
Leukocytes, UA: NEGATIVE
Nitrite, UA: NEGATIVE
POC PROTEIN,UA: NEGATIVE
Spec Grav, UA: >=1.03 — AB (ref 1.010–1.025)
Urobilinogen, UA: 0.2 U/dL
pH, UA: 6 (ref 5.0–8.0)

## 2024-01-30 LAB — POCT GLYCOSYLATED HEMOGLOBIN (HGB A1C): Hemoglobin A1C: 6.8 % — AB (ref 4.0–5.6)

## 2024-01-30 MED ORDER — GLIPIZIDE 5 MG PO TABS
5.0000 mg | ORAL_TABLET | Freq: Every day | ORAL | 0 refills | Status: DC
  Filled 2024-01-30: qty 90, 90d supply, fill #0

## 2024-01-30 MED ORDER — METFORMIN HCL 500 MG PO TABS
1000.0000 mg | ORAL_TABLET | Freq: Two times a day (BID) | ORAL | 0 refills | Status: DC
  Filled 2024-01-30: qty 360, 90d supply, fill #0

## 2024-01-30 MED ORDER — FLUTICASONE PROPIONATE 50 MCG/ACT NA SUSP
2.0000 | Freq: Every day | NASAL | 1 refills | Status: AC
  Filled 2024-01-30: qty 16, 30d supply, fill #0

## 2024-01-30 MED ORDER — ATORVASTATIN CALCIUM 20 MG PO TABS
20.0000 mg | ORAL_TABLET | Freq: Every day | ORAL | 0 refills | Status: DC
  Filled 2024-01-30: qty 90, 90d supply, fill #0

## 2024-01-30 NOTE — Progress Notes (Signed)
 Patient ID: Elaine Burch, female    DOB: 11-Apr-1966  MRN: [id number]  CC: Chronic Conditions Follow-Up  Subjective: Elaine Burch is a 58 y.o. female who presents for chronic conditions follow-up.   Her concerns today include:  - Doing well on Metformin  and Glipizide , no issues/concerns. Denies red flag symptoms associated with diabetes. - Doing well on Atorvastatin , no issues/concerns.  - Intermittent right nasal congestion. Denies red flag symptoms.  - States urine odor and "foam". Denies red flag symptoms. - Established with Orthopedics.   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 100 each 0   acetaminophen  (TYLENOL ) 500 MG tablet Take 500 mg by mouth every 6 (six) hours as needed for moderate pain or headache.     aspirin  EC 81 MG tablet Take 81 mg by mouth daily. Swallow whole.     Blood Glucose Monitoring Suppl (ACCU-CHEK GUIDE) w/Device KIT Use in the morning, at noon, and at bedtime. 1 kit 0   Pseudoephedrine-Acetaminophen  (SM NON-ASPRIN SINUS PO) Take by mouth.     No current facility-administered medications on file prior to visit.    No Known Allergies  Social History   Socioeconomic History   Marital status: Married    Spouse name: Not on file   Number of children: 4   Years of education: Not on file   Highest education level: 6th grade  Occupational History    Not on file  Tobacco Use   Smoking status: Never    Passive exposure: Never   Smokeless tobacco: Never  Vaping Use   Vaping status: Never Used  Substance and Sexual Activity   Alcohol use: No   Drug use: No   Sexual activity: Yes    Birth control/protection: Post-menopausal  Other Topics Concern   Not on file  Social History Narrative   Not on file   Social Drivers of Health   Financial Resource Strain: Low Risk  (01/30/2024)   Overall Financial Resource Strain (CARDIA)    Difficulty of Paying Living Expenses: Not hard at all  Food Insecurity: Food Insecurity Present (11/23/2022)   Hunger Vital Sign    Worried About Running Out of Food in the Last Year: Sometimes true    Ran Out of Food in the Last Year: Sometimes true  Transportation Needs: No Transportation Needs (01/30/2024)   PRAPARE - Administrator, Civil Service (Medical): No    Lack of Transportation (Non-Medical): No  Physical Activity: Insufficiently Active (01/30/2024)   Exercise Vital Sign    Days of Exercise per Week: 4 days    Minutes of Exercise per Session: 30 min  Stress: No Stress Concern Present (01/30/2024)   [employer] of Occupational Health - Occupational Stress Questionnaire    Feeling of Stress : Not at all  Social Connections: Moderately Isolated (01/30/2024)   Social Connection and Isolation Panel [NHANES]    Frequency of Communication with Friends  and Family: More than three times a week    Frequency of Social Gatherings with Friends and Family: More than three times a week    Attends Religious Services: Never    Database administrator or Organizations: No    Attends Banker Meetings: Never    Marital Status: Married  Catering manager Violence: Not At Risk (01/30/2024)   Humiliation, Afraid, Rape, and Kick questionnaire    Fear of Current or Ex-Partner: No    Emotionally Abused: No    Physically Abused: No    Sexually Abused: No    Family History  Problem  Relation Age of Onset   Diabetes Mother    Cancer Father    Diabetes Brother    Breast cancer Neg Hx    Colon cancer Neg Hx    Colon polyps Neg Hx    Esophageal cancer Neg Hx    Rectal cancer Neg Hx    Stomach cancer Neg Hx     Past Surgical History:  Procedure Laterality Date   NO PAST SURGERIES      ROS: Review of Systems Negative except as stated above  PHYSICAL EXAM: BP 124/80   Pulse 62   Temp 98.3 F (36.8 C) (Oral)   Resp 16   Ht 5' 1.5" (1.562 m)   Wt 175 lb 6.4 oz (79.6 kg)   SpO2 95%   BMI 32.60 kg/m   Physical Exam HENT:     Head: Normocephalic and atraumatic.     Nose: Nose normal.     Mouth/Throat:     Mouth: Mucous membranes are moist.     Pharynx: Oropharynx is clear.  Eyes:     Extraocular Movements: Extraocular movements intact.     Conjunctiva/sclera: Conjunctivae normal.     Pupils: Pupils are equal, round, and reactive to light.  Cardiovascular:     Rate and Rhythm: Normal rate and regular rhythm.     Pulses: Normal pulses.     Heart sounds: Normal heart sounds.  Pulmonary:     Effort: Pulmonary effort is normal.     Breath sounds: Normal breath sounds.  Musculoskeletal:        General: Normal range of motion.     Cervical back: Normal range of motion and neck supple.  Neurological:     General: No focal deficit present.     Mental Status: She is alert and oriented to person, place, and time.  Psychiatric:        Mood and Affect: Mood normal.        Behavior: Behavior normal.     ASSESSMENT AND PLAN: 1. Type 2 diabetes mellitus with hyperglycemia, without long-term current use of insulin  (HCC) (Primary) - Continue Metformin  and Glipizide  as prescribed.  - Hemoglobin A1c result pending.  - Routine screening.  - Discussed the importance of healthy eating habits, low-carbohydrate diet, low-sugar diet, regular aerobic exercise (at least 150 minutes a week as tolerated) and medication compliance to achieve or maintain control of  diabetes. Counseled on medication adherence/adverse effects. - Follow-up with primary provider as scheduled. - POCT glycosylated hemoglobin (Hb A1C) - Basic Metabolic Panel - glipiZIDE  (GLUCOTROL ) 5 MG tablet; Take 1 tablet (5 mg total) by mouth daily before breakfast.  Dispense: 90 tablet; Refill: 0 - metFORMIN  (GLUCOPHAGE ) 500 MG tablet; Take 2 tablets (1,000 mg total) by mouth 2 (two) times daily with a meal.  Dispense: 360 tablet; Refill: 0  2. Diabetic eye exam [facility]) - Referral to  Ophthalmology for evaluation/management. - Ambulatory referral to Ophthalmology  3. Hyperlipidemia, unspecified hyperlipidemia type - Continue Atorvastatin  as prescribed. Counseled on medication adherence/adverse effects. - Routine screening.  - Follow-up with primary provider as scheduled. - Lipid panel - atorvastatin  (LIPITOR) 20 MG tablet; Take 1 tablet (20 mg total) by mouth daily.  Dispense: 90 tablet; Refill: 0  4. Frequent urination - Routine screening.  - POCT URINALYSIS DIP (CLINITEK); Future - Urine Culture  5. Perennial allergic rhinitis - Fluticasone nasal spray as prescribed. Counseled on medication adherence/adverse effects.  - Follow-up with primary provider as scheduled. - fluticasone (FLONASE) 50 MCG/ACT nasal spray; Place 2 sprays into both nostrils daily.  Dispense: 16 g; Refill: 1  6. Language barrier - [employer]. ID#: [id number].   Patient was given the opportunity to ask questions.  Patient verbalized understanding of the plan and was able to repeat key elements of the plan. Patient was given clear instructions to go to Emergency Department or return to medical center if symptoms don't improve, worsen, or new problems develop.The patient verbalized understanding.   Orders Placed This Encounter  Procedures   Urine Culture   Basic Metabolic Panel   Lipid panel   Ambulatory referral to Ophthalmology   POCT glycosylated hemoglobin (Hb A1C)   POCT URINALYSIS DIP  (CLINITEK)     Requested Prescriptions   Signed Prescriptions Disp Refills   glipiZIDE  (GLUCOTROL ) 5 MG tablet 90 tablet 0    Sig: Take 1 tablet (5 mg total) by mouth daily before breakfast.   metFORMIN  (GLUCOPHAGE ) 500 MG tablet 360 tablet 0    Sig: Take 2 tablets (1,000 mg total) by mouth 2 (two) times daily with a meal.   atorvastatin  (LIPITOR) 20 MG tablet 90 tablet 0    Sig: Take 1 tablet (20 mg total) by mouth daily.   fluticasone (FLONASE) 50 MCG/ACT nasal spray 16 g 1    Sig: Place 2 sprays into both nostrils daily.    Follow-up with primary provider as scheduled.  Senaida Dama, NP

## 2024-01-30 NOTE — Progress Notes (Signed)
 3 month, patient has so mucus that coming up with a little blood in it , urine is foamy sometimes, pain in the back of neck

## 2024-01-31 LAB — LIPID PANEL
Chol/HDL Ratio: 2.7 ratio (ref 0.0–4.4)
Cholesterol, Total: 195 mg/dL (ref 100–199)
HDL: 71 mg/dL (ref >39)
LDL Chol Calc (NIH): 100 mg/dL — ABNORMAL HIGH (ref 0–99)
Triglycerides: 142 mg/dL (ref 0–149)
VLDL Cholesterol Cal: 24 mg/dL (ref 5–40)

## 2024-01-31 LAB — BASIC METABOLIC PANEL WITH GFR
BUN/Creatinine Ratio: 25 — ABNORMAL HIGH (ref 9–23)
BUN: 14 mg/dL (ref 6–24)
CO2: 25 mmol/L (ref 20–29)
Calcium: 10.1 mg/dL (ref 8.7–10.2)
Chloride: 105 mmol/L (ref 96–106)
Creatinine, Ser: 0.55 mg/dL — ABNORMAL LOW (ref 0.57–1.00)
Glucose: 127 mg/dL — ABNORMAL HIGH (ref 70–99)
Potassium: 4.4 mmol/L (ref 3.5–5.2)
Sodium: 141 mmol/L (ref 134–144)
eGFR: 108 mL/min/{1.73_m2} (ref >59)

## 2024-02-01 ENCOUNTER — Other Ambulatory Visit: Payer: Self-pay

## 2024-02-02 ENCOUNTER — Other Ambulatory Visit: Payer: Self-pay | Admitting: Family

## 2024-02-02 ENCOUNTER — Other Ambulatory Visit: Payer: Self-pay

## 2024-02-02 DIAGNOSIS — N3000 Acute cystitis without hematuria: Secondary | ICD-10-CM

## 2024-02-02 LAB — URINE CULTURE

## 2024-02-02 MED ORDER — AMPICILLIN 500 MG PO CAPS
500.0000 mg | ORAL_CAPSULE | Freq: Four times a day (QID) | ORAL | 0 refills | Status: AC
  Filled 2024-02-02: qty 28, 7d supply, fill #0

## 2024-02-03 ENCOUNTER — Encounter: Payer: Self-pay | Admitting: *Deleted

## 2024-02-03 ENCOUNTER — Other Ambulatory Visit: Payer: Self-pay

## 2024-03-05 ENCOUNTER — Other Ambulatory Visit: Payer: Self-pay | Admitting: Family

## 2024-03-05 ENCOUNTER — Other Ambulatory Visit: Payer: Self-pay

## 2024-03-05 DIAGNOSIS — E1165 Type 2 diabetes mellitus with hyperglycemia: Secondary | ICD-10-CM

## 2024-03-05 DIAGNOSIS — E785 Hyperlipidemia, unspecified: Secondary | ICD-10-CM

## 2024-03-08 ENCOUNTER — Other Ambulatory Visit: Payer: Self-pay

## 2024-04-30 ENCOUNTER — Other Ambulatory Visit: Payer: Self-pay

## 2024-04-30 ENCOUNTER — Encounter: Payer: Self-pay | Admitting: Family

## 2024-04-30 ENCOUNTER — Ambulatory Visit: Payer: Self-pay | Admitting: Family

## 2024-04-30 ENCOUNTER — Telehealth: Payer: Self-pay | Admitting: Family

## 2024-04-30 ENCOUNTER — Other Ambulatory Visit (HOSPITAL_COMMUNITY)
Admission: RE | Admit: 2024-04-30 | Discharge: 2024-04-30 | Disposition: A | Discharge status: Home / Self Care | Source: Ambulatory Visit | Attending: Family | Admitting: Family

## 2024-04-30 ENCOUNTER — Ambulatory Visit: Admitting: Family

## 2024-04-30 VITALS — BP 122/78 | HR 68 | Temp 98.9°F | Resp 16 | Ht 61.5 in | Wt 174.4 lb

## 2024-04-30 DIAGNOSIS — Z7984 Long term (current) use of oral hypoglycemic drugs: Secondary | ICD-10-CM

## 2024-04-30 DIAGNOSIS — R399 Unspecified symptoms and signs involving the genitourinary system: Secondary | ICD-10-CM | POA: Insufficient documentation

## 2024-04-30 DIAGNOSIS — N3000 Acute cystitis without hematuria: Secondary | ICD-10-CM

## 2024-04-30 DIAGNOSIS — E1165 Type 2 diabetes mellitus with hyperglycemia: Secondary | ICD-10-CM

## 2024-04-30 DIAGNOSIS — Z603 Acculturation difficulty: Secondary | ICD-10-CM

## 2024-04-30 DIAGNOSIS — E785 Hyperlipidemia, unspecified: Secondary | ICD-10-CM | POA: Diagnosis not present

## 2024-04-30 DIAGNOSIS — E119 Type 2 diabetes mellitus without complications: Secondary | ICD-10-CM

## 2024-04-30 DIAGNOSIS — Z758 Other problems related to medical facilities and other health care: Secondary | ICD-10-CM

## 2024-04-30 LAB — POCT URINALYSIS DIP (CLINITEK)
Bilirubin, UA: NEGATIVE
Blood, UA: NEGATIVE
Glucose, UA: NEGATIVE mg/dL
Ketones, POC UA: NEGATIVE mg/dL
Leukocytes, UA: NEGATIVE
Nitrite, UA: NEGATIVE
POC PROTEIN,UA: NEGATIVE
Spec Grav, UA: 1.025 (ref 1.010–1.025)
Urobilinogen, UA: 0.2 U/dL
pH, UA: 6 (ref 5.0–8.0)

## 2024-04-30 LAB — POCT GLYCOSYLATED HEMOGLOBIN (HGB A1C): Hemoglobin A1C: 6.6 % — AB (ref 4.0–5.6)

## 2024-04-30 MED ORDER — METFORMIN HCL 500 MG PO TABS
1000.0000 mg | ORAL_TABLET | Freq: Two times a day (BID) | ORAL | 0 refills | Status: DC
  Filled 2024-04-30: qty 360, 90d supply, fill #0

## 2024-04-30 MED ORDER — ATORVASTATIN CALCIUM 20 MG PO TABS
20.0000 mg | ORAL_TABLET | Freq: Every day | ORAL | 0 refills | Status: DC
  Filled 2024-04-30: qty 90, 90d supply, fill #0

## 2024-04-30 MED ORDER — GLIPIZIDE 5 MG PO TABS
5.0000 mg | ORAL_TABLET | Freq: Every day | ORAL | 0 refills | Status: DC
Start: 2024-04-30 — End: 2024-08-17
  Filled 2024-04-30: qty 90, 90d supply, fill #0

## 2024-04-30 NOTE — Progress Notes (Signed)
 3 month follow up,patient has foam in her urine, the other day she had sharp pain alone the waist line and could not walk

## 2024-04-30 NOTE — Progress Notes (Signed)
 Patient ID: Elaine Burch, female    DOB: 1966-04-12  MRN: [id number]  CC: Chronic Conditions Follow-Up  Subjective: Elaine Burch is a 58 y.o. female who presents for chronic conditions follow-up.   Her concerns today include:  - Doing well on Metformin  and Glipizide , no issues/concerns. Denies red flag symptoms associated with diabetes.  - Due for diabetic eye exam.  - Doing well on Atorvastatin , no issues/concerns.  - States foam in her urine and back pain. Denies red flag symptoms.  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 100 each 0   acetaminophen  (TYLENOL ) 500 MG tablet Take 500 mg by mouth every 6 (six) hours as needed for moderate pain or headache.     aspirin  EC 81 MG tablet Take 81 mg by mouth daily. Swallow whole.     Blood Glucose Monitoring Suppl (ACCU-CHEK GUIDE) w/Device KIT Use in the morning, at noon, and at bedtime. 1 kit 0   fluticasone  (FLONASE ) 50 MCG/ACT nasal spray Place 2 sprays into both nostrils daily. 16 g 1   Pseudoephedrine-Acetaminophen  (SM NON-ASPRIN SINUS PO) Take by mouth.     No current facility-administered medications on file prior to visit.    No Known Allergies  Social History   Socioeconomic History   Marital status: Married    Spouse name: Not on file   Number of children: 4   Years of education: Not on file   Highest education  level: 6th grade  Occupational History   Not on file  Tobacco Use   Smoking status: Never    Passive exposure: Never   Smokeless tobacco: Never  Vaping Use   Vaping status: Never Used  Substance and Sexual Activity   Alcohol use: No   Drug use: No   Sexual activity: Yes    Birth control/protection: Post-menopausal  Other Topics Concern   Not on file  Social History Narrative   Not on file   Social Drivers of Health   Financial Resource Strain: Low Risk  (01/30/2024)   Overall Financial Resource Strain (CARDIA)    Difficulty of Paying Living Expenses: Not hard at all  Food Insecurity: Food Insecurity Present (11/23/2022)   Hunger Vital Sign    Worried About Running Out of Food in the Last Year: Sometimes true    Ran Out of Food in the Last Year: Sometimes true  Transportation Needs: No Transportation Needs (01/30/2024)   PRAPARE - Administrator, Civil Service (Medical): No    Lack of Transportation (Non-Medical): No  Physical Activity: Insufficiently Active (01/30/2024)   Exercise Vital Sign    Days of Exercise per Week: 4 days    Minutes of Exercise per Session: 30 min  Stress: No Stress Concern Present (01/30/2024)   [employer] of Occupational Health - Occupational Stress Questionnaire    Feeling of Stress : Not at all  Social Connections: Moderately Isolated (01/30/2024)  Social Connection and Isolation Panel    Frequency of Communication with Friends and Family: More than three times a week    Frequency of Social Gatherings with Friends and Family: More than three times a week    Attends Religious Services: Never    Database administrator or Organizations: No    Attends Banker Meetings: Never    Marital Status: Married  Catering manager Violence: Not At Risk (01/30/2024)   Humiliation, Afraid, Rape, and Kick questionnaire    Fear of Current or Ex-Partner: No    Emotionally Abused: No    Physically Abused: No    Sexually Abused: No     Family History  Problem Relation Age of Onset   Diabetes Mother    Cancer Father    Diabetes Brother    Breast cancer Neg Hx    Colon cancer Neg Hx    Colon polyps Neg Hx    Esophageal cancer Neg Hx    Rectal cancer Neg Hx    Stomach cancer Neg Hx     Past Surgical History:  Procedure Laterality Date   NO PAST SURGERIES      ROS: Review of Systems Negative except as stated above  PHYSICAL EXAM: BP 122/78   Pulse 68   Temp 98.9 F (37.2 C) (Oral)   Resp 16   Ht 5' 1.5 (1.562 m)   Wt 174 lb 6.4 oz (79.1 kg)   SpO2 93%   BMI 32.42 kg/m   Physical Exam HENT:     Head: Normocephalic and atraumatic.     Nose: Nose normal.     Mouth/Throat:     Mouth: Mucous membranes are moist.     Pharynx: Oropharynx is clear.  Eyes:     Extraocular Movements: Extraocular movements intact.     Conjunctiva/sclera: Conjunctivae normal.     Pupils: Pupils are equal, round, and reactive to light.  Cardiovascular:     Rate and Rhythm: Normal rate and regular rhythm.     Pulses: Normal pulses.     Heart sounds: Normal heart sounds.  Pulmonary:     Effort: Pulmonary effort is normal.     Breath sounds: Normal breath sounds.  Abdominal:     General: Bowel sounds are normal.     Palpations: Abdomen is soft.  Musculoskeletal:        General: Normal range of motion.     Cervical back: Normal range of motion and neck supple.  Neurological:     General: No focal deficit present.     Mental Status: She is alert and oriented to person, place, and time.  Psychiatric:        Mood and Affect: Mood normal.        Behavior: Behavior normal.     ASSESSMENT AND PLAN: 1. Type 2 diabetes mellitus with hyperglycemia, without long-term current use of insulin  (HCC) (Primary) - Continue Metformin  and Glipizide  as prescribed. - Hemoglobin A1c result pending.  - Discussed the importance of healthy eating habits, low-carbohydrate diet, low-sugar diet, regular aerobic exercise (at least 150  minutes a week as tolerated) and medication compliance to achieve or maintain control of diabetes. Counseled on medication adherence/adverse effects.  - Follow-up with primary provider as scheduled. - metFORMIN  (GLUCOPHAGE ) 500 MG tablet; Take 2 tablets (1,000 mg total) by mouth 2 (two) times daily with a meal.  Dispense: 360 tablet; Refill: 0 - glipiZIDE  (GLUCOTROL ) 5 MG tablet; Take 1 tablet (5 mg total) by mouth  daily before breakfast.  Dispense: 90 tablet; Refill: 0 - POCT glycosylated hemoglobin (Hb A1C); Future  2. Diabetic eye exam [facility]) - Referral to Ophthalmology for evaluation/management. - Ambulatory referral to Ophthalmology  3. Hyperlipidemia, unspecified hyperlipidemia type - Continue Atorvastatin  as prescribed. Counseled on medication adherence/adverse effects.  - Follow-up with primary provider  in 3 months or sooner if needed.  - atorvastatin  (LIPITOR) 20 MG tablet; Take 1 tablet (20 mg total) by mouth daily.  Dispense: 90 tablet; Refill: 0  4. Lower urinary tract symptoms (LUTS) - Routine screening.  - Referral to Urology for evaluation/management. - POCT URINALYSIS DIP (CLINITEK); Future - Urine Culture - Ambulatory referral to Urology - Cervicovaginal ancillary only  5. Language barrier - [employer] [facility]. ID#: [id number]    Patient was given the opportunity to ask questions.  Patient verbalized understanding of the plan and was able to repeat key elements of the plan. Patient was given clear instructions to go to Emergency Department or return to medical center if symptoms don't improve, worsen, or new problems develop.The patient verbalized understanding.   Orders Placed This Encounter  Procedures   Urine Culture   Ambulatory referral to Ophthalmology   Ambulatory referral to Urology   POCT URINALYSIS DIP (CLINITEK)   POCT glycosylated hemoglobin (Hb A1C)     Requested Prescriptions   Signed Prescriptions Disp Refills   metFORMIN  (GLUCOPHAGE ) 500 MG  tablet 360 tablet 0    Sig: Take 2 tablets (1,000 mg total) by mouth 2 (two) times daily with a meal.   glipiZIDE  (GLUCOTROL ) 5 MG tablet 90 tablet 0    Sig: Take 1 tablet (5 mg total) by mouth daily before breakfast.   atorvastatin  (LIPITOR) 20 MG tablet 90 tablet 0    Sig: Take 1 tablet (20 mg total) by mouth daily.    Follow-up with primary provider as scheduled.  Greig JINNY Drones, NP

## 2024-04-30 NOTE — Telephone Encounter (Signed)
 Pt is requesting lancets and test strips for her glucometer to be sent in to pharmacy

## 2024-05-01 ENCOUNTER — Other Ambulatory Visit: Payer: Self-pay

## 2024-05-01 LAB — CERVICOVAGINAL ANCILLARY ONLY
Bacterial Vaginitis (gardnerella): NEGATIVE
Candida Glabrata: NEGATIVE
Candida Vaginitis: NEGATIVE
Chlamydia: NEGATIVE
Comment: NEGATIVE
Comment: NEGATIVE
Comment: NEGATIVE
Comment: NEGATIVE
Comment: NEGATIVE
Comment: NORMAL
Neisseria Gonorrhea: NEGATIVE
Trichomonas: NEGATIVE

## 2024-05-02 ENCOUNTER — Other Ambulatory Visit: Payer: Self-pay

## 2024-05-02 LAB — URINE CULTURE

## 2024-05-02 MED ORDER — AMPICILLIN 500 MG PO CAPS
500.0000 mg | ORAL_CAPSULE | Freq: Four times a day (QID) | ORAL | 0 refills | Status: AC
Start: 2024-05-02 — End: 2024-05-10
  Filled 2024-05-02: qty 28, 7d supply, fill #0

## 2024-05-03 ENCOUNTER — Other Ambulatory Visit: Payer: Self-pay

## 2024-05-03 ENCOUNTER — Other Ambulatory Visit: Payer: Self-pay | Admitting: Family

## 2024-05-03 DIAGNOSIS — E119 Type 2 diabetes mellitus without complications: Secondary | ICD-10-CM

## 2024-05-04 ENCOUNTER — Other Ambulatory Visit: Payer: Self-pay

## 2024-05-04 ENCOUNTER — Other Ambulatory Visit: Payer: Self-pay | Admitting: Family

## 2024-05-04 DIAGNOSIS — E119 Type 2 diabetes mellitus without complications: Secondary | ICD-10-CM

## 2024-05-04 MED ORDER — ACCU-CHEK SOFTCLIX LANCETS MISC
1.0000 | Freq: Three times a day (TID) | 11 refills | Status: DC
  Filled 2024-05-04: qty 100, 34d supply, fill #0

## 2024-05-04 MED ORDER — ACCU-CHEK GUIDE TEST VI STRP: 1.0000 | ORAL_STRIP | Freq: Three times a day (TID) | 11 refills | Status: DC

## 2024-05-04 MED ORDER — ACCU-CHEK GUIDE TEST VI STRP
1.0000 | ORAL_STRIP | Freq: Three times a day (TID) | 0 refills | Status: DC
  Filled 2024-05-04: qty 100, 34d supply, fill #0

## 2024-05-04 NOTE — Telephone Encounter (Unsigned)
 Copied from CRM [id number]. Topic: Clinical - Medication Refill >> May 04, 2024 10:34 AM Willma R wrote: Medication: Accu-Chek Softclix Lancets lancets  Has the patient contacted their pharmacy? Yes, call dr  This is the patient's preferred pharmacy:  [facility] [address] [place] [address] Phone: [phone] Fax: [phone]  Is this the correct pharmacy for this prescription? Yes If no, delete pharmacy and type the correct one.   Has the prescription been filled recently? No  Is the patient out of the medication? Yes  Has the patient been seen for an appointment in the last year OR does the patient have an upcoming appointment? Yes  Can we respond through MyChart? No  Agent: Please be advised that Rx refills may take up to 3 business days. We ask that you follow-up with your pharmacy.

## 2024-05-04 NOTE — Telephone Encounter (Signed)
 Refused Accu-Chek lancets because it was sent in earlier today 05/04/2024 for #100, 11 refills.

## 2024-05-04 NOTE — Telephone Encounter (Signed)
 Complete

## 2024-07-09 LAB — OPHTHALMOLOGY REPORT-SCANNED

## 2024-07-10 ENCOUNTER — Ambulatory Visit: Payer: Self-pay | Admitting: Family Medicine

## 2024-07-10 ENCOUNTER — Telehealth: Payer: Self-pay | Admitting: Orthopaedic Surgery

## 2024-07-10 NOTE — Telephone Encounter (Signed)
 Received wrong auth patient signed for a copy of medical records. Wrong form and was not filled out. Mailed pt the correct authorization form for [facility]

## 2024-08-06 ENCOUNTER — Encounter: Payer: Self-pay | Admitting: Radiology

## 2024-08-07 ENCOUNTER — Telehealth: Payer: Self-pay | Admitting: Family

## 2024-08-07 NOTE — Telephone Encounter (Signed)
 Called pt and left vm. Pt appt was rescheduled to 11/14 due to pcp will not be in the office tomorrow afternoon.

## 2024-08-08 ENCOUNTER — Other Ambulatory Visit: Payer: Self-pay | Admitting: Family

## 2024-08-08 ENCOUNTER — Ambulatory Visit: Admitting: Family

## 2024-08-08 DIAGNOSIS — Z1231 Encounter for screening mammogram for malignant neoplasm of breast: Secondary | ICD-10-CM

## 2024-08-17 ENCOUNTER — Encounter: Payer: Self-pay | Admitting: Family

## 2024-08-17 ENCOUNTER — Other Ambulatory Visit: Payer: Self-pay

## 2024-08-17 ENCOUNTER — Ambulatory Visit: Admitting: Family

## 2024-08-17 VITALS — BP 120/81 | HR 64 | Temp 99.1°F | Resp 16 | Ht 61.5 in | Wt 174.2 lb

## 2024-08-17 DIAGNOSIS — Z7984 Long term (current) use of oral hypoglycemic drugs: Secondary | ICD-10-CM | POA: Diagnosis not present

## 2024-08-17 DIAGNOSIS — R5383 Other fatigue: Secondary | ICD-10-CM | POA: Diagnosis not present

## 2024-08-17 DIAGNOSIS — N644 Mastodynia: Secondary | ICD-10-CM

## 2024-08-17 DIAGNOSIS — Z603 Acculturation difficulty: Secondary | ICD-10-CM

## 2024-08-17 DIAGNOSIS — M79604 Pain in right leg: Secondary | ICD-10-CM

## 2024-08-17 DIAGNOSIS — M79605 Pain in left leg: Secondary | ICD-10-CM

## 2024-08-17 DIAGNOSIS — M25552 Pain in left hip: Secondary | ICD-10-CM

## 2024-08-17 DIAGNOSIS — E785 Hyperlipidemia, unspecified: Secondary | ICD-10-CM

## 2024-08-17 DIAGNOSIS — M25551 Pain in right hip: Secondary | ICD-10-CM

## 2024-08-17 DIAGNOSIS — E1165 Type 2 diabetes mellitus with hyperglycemia: Secondary | ICD-10-CM | POA: Diagnosis not present

## 2024-08-17 MED ORDER — ATORVASTATIN CALCIUM 20 MG PO TABS
20.0000 mg | ORAL_TABLET | Freq: Every day | ORAL | 0 refills | Status: AC
  Filled 2024-08-17: qty 90, 90d supply, fill #0

## 2024-08-17 MED ORDER — ACCU-CHEK GUIDE TEST VI STRP
1.0000 | ORAL_STRIP | Freq: Three times a day (TID) | 11 refills | Status: AC
  Filled 2024-08-17: qty 100, 34d supply, fill #0

## 2024-08-17 MED ORDER — METFORMIN HCL 500 MG PO TABS
1000.0000 mg | ORAL_TABLET | Freq: Two times a day (BID) | ORAL | 0 refills | Status: AC
  Filled 2024-08-17: qty 360, 90d supply, fill #0

## 2024-08-17 MED ORDER — GLIPIZIDE 5 MG PO TABS
5.0000 mg | ORAL_TABLET | Freq: Every day | ORAL | 0 refills | Status: AC
  Filled 2024-08-17: qty 90, 90d supply, fill #0

## 2024-08-17 MED ORDER — GABAPENTIN 300 MG PO CAPS
300.0000 mg | ORAL_CAPSULE | Freq: Every evening | ORAL | 0 refills | Status: AC | PRN
  Filled 2024-08-17: qty 90, 90d supply, fill #0

## 2024-08-17 MED ORDER — ACCU-CHEK SOFTCLIX LANCETS MISC
1.0000 | Freq: Three times a day (TID) | 11 refills | Status: AC
  Filled 2024-08-17: qty 100, 34d supply, fill #0

## 2024-08-17 NOTE — Progress Notes (Signed)
 Patient ID: Elaine Burch, female    DOB: Sep 19, 1966  MRN: [id number]  CC: Chronic Conditions Follow-Up  Subjective: Elaine Burch is a 58 y.o. female who presents for chronic conditions follow-up.  Her concerns today include:  - Doing well on Metformin  and Glipizide , no issues/concerns. Denies red flag symptoms associated with diabetes. Needs refills of lancets and test strips. - Doing well on Atorvastatin , no issues/concerns.  - Fatigue.  - Right breast pain. Denies additional symptoms. - Bilateral hip pain radiating to bilateral lower extremities. Denies recent trauma/injury and red flag symptoms.   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     aspirin  EC 81 MG tablet Take 81 mg by mouth daily. Swallow whole.     Blood Glucose Monitoring Suppl (ACCU-CHEK GUIDE) w/Device KIT Use in the morning, at noon, and at bedtime. 1 kit 0   fluticasone  (FLONASE ) 50 MCG/ACT nasal spray Place 2 sprays into both nostrils daily. 16 g 1   Pseudoephedrine-Acetaminophen  (SM NON-ASPRIN SINUS PO) Take by mouth.     No current facility-administered medications on file prior to visit.    No Known Allergies  Social History   Socioeconomic History   Marital status: Married    Spouse name: Not on file   Number of children: 4   Years of education: Not on file    Highest education level: 6th grade  Occupational History   Not on file  Tobacco Use   Smoking status: Never    Passive exposure: Never   Smokeless tobacco: Never  Vaping Use   Vaping status: Never Used  Substance and Sexual Activity   Alcohol use: No   Drug use: No   Sexual activity: Yes    Birth control/protection: Post-menopausal  Other Topics Concern   Not on file  Social History Narrative   Not on file   Social Drivers of Health   Financial Resource Strain: Low Risk  (01/30/2024)   Overall Financial Resource Strain (CARDIA)    Difficulty of Paying Living Expenses: Not hard at all  Food Insecurity: Food Insecurity Present (11/23/2022)   Hunger Vital Sign    Worried About Running Out of Food in the Last Year: Sometimes true    Ran Out of Food in the Last Year: Sometimes true  Transportation Needs: No Transportation Needs (01/30/2024)   PRAPARE - Administrator, Civil Service (Medical): No    Lack of Transportation (Non-Medical): No  Physical Activity: Insufficiently Active (01/30/2024)   Exercise Vital Sign    Days of Exercise per Week: 4 days    Minutes of Exercise per Session: 30 min  Stress: No Stress Concern Present (01/30/2024)   [employer] of Occupational Health - Occupational Stress Questionnaire    Feeling of Stress : Not at all  Social Connections: Moderately Isolated (01/30/2024)   Social Connection and Isolation Panel  Frequency of Communication with Friends and Family: More than three times a week    Frequency of Social Gatherings with Friends and Family: More than three times a week    Attends Religious Services: Never    Database Administrator or Organizations: No    Attends Banker Meetings: Never    Marital Status: Married  Catering Manager Violence: Not At Risk (01/30/2024)   Humiliation, Afraid, Rape, and Kick questionnaire    Fear of Current or Ex-Partner: No    Emotionally Abused: No    Physically Abused: No     Sexually Abused: No    Family History  Problem Relation Age of Onset   Diabetes Mother    Cancer Father    Diabetes Brother    Breast cancer Neg Hx    Colon cancer Neg Hx    Colon polyps Neg Hx    Esophageal cancer Neg Hx    Rectal cancer Neg Hx    Stomach cancer Neg Hx     Past Surgical History:  Procedure Laterality Date   NO PAST SURGERIES      ROS: Review of Systems Negative except as stated above  PHYSICAL EXAM: BP 120/81   Pulse 64   Temp 99.1 F (37.3 C) (Oral)   Resp 16   Ht 5' 1.5 (1.562 m)   Wt 174 lb 3.2 oz (79 kg)   BMI 32.38 kg/m   Physical Exam HENT:     Head: Normocephalic and atraumatic.     Nose: Nose normal.     Mouth/Throat:     Mouth: Mucous membranes are moist.     Pharynx: Oropharynx is clear.  Eyes:     Extraocular Movements: Extraocular movements intact.     Conjunctiva/sclera: Conjunctivae normal.     Pupils: Pupils are equal, round, and reactive to light.  Cardiovascular:     Rate and Rhythm: Normal rate and regular rhythm.     Pulses: Normal pulses.     Heart sounds: Normal heart sounds.  Pulmonary:     Effort: Pulmonary effort is normal.     Breath sounds: Normal breath sounds.  Chest:     Comments: Patient declined. Musculoskeletal:        General: Normal range of motion.     Cervical back: Normal range of motion and neck supple.     Right hip: Normal.     Left hip: Normal.     Right upper leg: Normal.     Left upper leg: Normal.     Right knee: Normal.     Left knee: Normal.     Right lower leg: Normal.     Left lower leg: Normal.     Right ankle: Normal.     Left ankle: Normal.     Right foot: Normal.     Left foot: Normal.  Neurological:     General: No focal deficit present.     Mental Status: She is alert and oriented to person, place, and time.  Psychiatric:        Mood and Affect: Mood normal.        Behavior: Behavior normal.      ASSESSMENT AND PLAN: 1. Type 2 diabetes mellitus with hyperglycemia,  without long-term current use of insulin  (HCC) (Primary) - Hemoglobin A1c result pending.  - Continue Metformin  and Glipizide  as prescribed.  - Routine screening.  - Diabetic testing supplies prescribed.  - Discussed the importance of healthy eating habits, low-carbohydrate diet,  low-sugar diet, regular aerobic exercise (at least 150 minutes a week as tolerated) and medication compliance to achieve or maintain control of diabetes. Counseled on medication adherence/adverse effects.  - Follow-up with primary provider as scheduled.  - Microalbumin / creatinine urine ratio - Hemoglobin A1c - Basic Metabolic Panel - Accu-Chek Softclix Lancets lancets; Use in the morning, at noon, and at bedtime.  Dispense: 100 each; Refill: 11 - glucose blood (ACCU-CHEK GUIDE TEST) test strip; Test in the morning, at noon, and at bedtime.  Dispense: 100 each; Refill: 11 - metFORMIN  (GLUCOPHAGE ) 500 MG tablet; Take 2 tablets (1,000 mg total) by mouth 2 (two) times daily with a meal.  Dispense: 360 tablet; Refill: 0 - glipiZIDE  (GLUCOTROL ) 5 MG tablet; Take 1 tablet (5 mg total) by mouth daily before breakfast.  Dispense: 90 tablet; Refill: 0  2. Hyperlipidemia, unspecified hyperlipidemia type - Continue Atorvastatin  as prescribed. Counseled on medication adherence/adverse effects.  - Follow-up with primary provider in 3 months or sooner if needed.  - atorvastatin  (LIPITOR) 20 MG tablet; Take 1 tablet (20 mg total) by mouth daily.  Dispense: 90 tablet; Refill: 0  3. Fatigue, unspecified type - Routine screening.  - Vitamin D , 25-hydroxy - TSH - CBC  4. Breast pain - Routine screening.  - MM Digital Screening; Future - MM Digital Diagnostic Bilat; Future - US  BREAST COMPLETE UNI LEFT INC AXILLA; Future - US  BREAST COMPLETE UNI RIGHT INC AXILLA; Future  5. Bilateral hip pain - Gabapentin as prescribed. Counseled on medication adherence/adverse effects.  - Follow-up with primary provider as scheduled. -  gabapentin (NEURONTIN) 300 MG capsule; Take 1 capsule (300 mg total) by mouth at bedtime as needed.  Dispense: 90 capsule; Refill: 0  6. Bilateral leg pain - Routine screening.  - VAS US  LOWER EXTREMITY VENOUS (DVT); Future  7. Language barrier - [employer]. ID#: [id number]    Patient was given the opportunity to ask questions.  Patient verbalized understanding of the plan and was able to repeat key elements of the plan. Patient was given clear instructions to go to Emergency Department or return to medical center if symptoms don't improve, worsen, or new problems develop.The patient verbalized understanding.   Orders Placed This Encounter  Procedures   MM Digital Screening   MM Digital Diagnostic Bilat   US  BREAST COMPLETE UNI LEFT INC AXILLA   US  BREAST COMPLETE UNI RIGHT INC AXILLA   Microalbumin / creatinine urine ratio   Vitamin D , 25-hydroxy   TSH   CBC   Hemoglobin A1c   Basic Metabolic Panel   VAS US  LOWER EXTREMITY VENOUS (DVT)     Requested Prescriptions   Signed Prescriptions Disp Refills   Accu-Chek Softclix Lancets lancets 100 each 11    Sig: Use in the morning, at noon, and at bedtime.   glucose blood (ACCU-CHEK GUIDE TEST) test strip 100 each 11    Sig: Test in the morning, at noon, and at bedtime.   metFORMIN  (GLUCOPHAGE ) 500 MG tablet 360 tablet 0    Sig: Take 2 tablets (1,000 mg total) by mouth 2 (two) times daily with a meal.   glipiZIDE  (GLUCOTROL ) 5 MG tablet 90 tablet 0    Sig: Take 1 tablet (5 mg total) by mouth daily before breakfast.   atorvastatin  (LIPITOR) 20 MG tablet 90 tablet 0    Sig: Take 1 tablet (20 mg total) by mouth daily.   gabapentin (NEURONTIN) 300 MG capsule 90 capsule 0    Sig: Take  1 capsule (300 mg total) by mouth at bedtime as needed.    Return in about 3 months (around 11/17/2024) for Follow-Up or next available chronic conditions.  Greig JINNY Chute, NP

## 2024-08-17 NOTE — Progress Notes (Signed)
 3 month follow up patient is have pain in her right breast and on the nipple sometimes,, patient feels tired patient having hip pain in both hips that goes to her knees

## 2024-08-18 LAB — MICROALBUMIN / CREATININE URINE RATIO
Creatinine, Urine: 23.3 mg/dL
Microalb/Creat Ratio: 42 mg/g{creat} — ABNORMAL HIGH (ref 0–29)
Microalbumin, Urine: 9.8 ug/mL

## 2024-08-19 LAB — BASIC METABOLIC PANEL WITH GFR
BUN/Creatinine Ratio: 19 (ref 9–23)
BUN: 13 mg/dL (ref 6–24)
CO2: 20 mmol/L (ref 20–29)
Calcium: 10.4 mg/dL — ABNORMAL HIGH (ref 8.7–10.2)
Chloride: 104 mmol/L (ref 96–106)
Creatinine, Ser: 0.67 mg/dL (ref 0.57–1.00)
Glucose: 131 mg/dL — ABNORMAL HIGH (ref 70–99)
Potassium: 4.3 mmol/L (ref 3.5–5.2)
Sodium: 143 mmol/L (ref 134–144)
eGFR: 103 mL/min/1.73 (ref >59)

## 2024-08-19 LAB — VITAMIN D 25 HYDROXY (VIT D DEFICIENCY, FRACTURES): Vit D, 25-Hydroxy: 18.1 ng/mL — ABNORMAL LOW (ref 30.0–100.0)

## 2024-08-19 LAB — HEMOGLOBIN A1C
Est. average glucose Bld gHb Est-mCnc: 151 mg/dL
Hgb A1c MFr Bld: 6.9 % — ABNORMAL HIGH (ref 4.8–5.6)

## 2024-08-19 LAB — CBC
Hematocrit: 42.6 % (ref 34.0–46.6)
Hemoglobin: 14.1 g/dL (ref 11.1–15.9)
MCH: 31.8 pg (ref 26.6–33.0)
MCHC: 33.1 g/dL (ref 31.5–35.7)
MCV: 96 fL (ref 79–97)
Platelets: 185 x10E3/uL (ref 150–450)
RBC: 4.44 x10E6/uL (ref 3.77–5.28)
RDW: 12.9 % (ref 11.7–15.4)
WBC: 4.1 x10E3/uL (ref 3.4–10.8)

## 2024-08-19 LAB — TSH: TSH: 2.33 u[IU]/mL (ref 0.450–4.500)

## 2024-08-20 ENCOUNTER — Ambulatory Visit: Payer: Self-pay | Admitting: Family

## 2024-08-20 ENCOUNTER — Other Ambulatory Visit: Payer: Self-pay

## 2024-08-20 DIAGNOSIS — E559 Vitamin D deficiency, unspecified: Secondary | ICD-10-CM

## 2024-08-20 MED ORDER — VITAMIN D (ERGOCALCIFEROL) 1.25 MG (50000 UNIT) PO CAPS
50000.0000 [IU] | ORAL_CAPSULE | ORAL | 0 refills | Status: AC
  Filled 2024-08-20: qty 12, 84d supply, fill #0

## 2024-08-22 ENCOUNTER — Telehealth: Payer: Self-pay

## 2024-08-22 NOTE — Telephone Encounter (Signed)
 Copied from CRM [id number]. Topic: Appointments - Appointment Scheduling >> Aug 22, 2024  1:12 PM Pinkey ORN wrote: Patient/patient representative is calling to schedule an appointment. Refer to attachments for appointment information. >> Aug 22, 2024  1:15 PM Pinkey ORN wrote: Patient is needing to schedule a follow up appointment, but wasn't sure if the 2 could be combined together. Jaycee Greig PARAS, NP noted that patient needed to follow up in 3 months for diabetes management and 12 weeks for low vitamin D . Please follow up with patient so that she knows rather or not she's able to complete both at the same time.

## 2024-08-22 NOTE — Telephone Encounter (Signed)
 Called pt and advised. Pt agreed

## 2024-08-24 ENCOUNTER — Ambulatory Visit (HOSPITAL_COMMUNITY)
Admission: RE | Admit: 2024-08-24 | Discharge: 2024-08-24 | Disposition: A | Discharge status: Home / Self Care | Source: Ambulatory Visit | Attending: Family | Admitting: Family

## 2024-08-24 DIAGNOSIS — M79605 Pain in left leg: Secondary | ICD-10-CM | POA: Insufficient documentation

## 2024-08-24 DIAGNOSIS — M79604 Pain in right leg: Secondary | ICD-10-CM | POA: Diagnosis present

## 2024-09-19 ENCOUNTER — Inpatient Hospital Stay: Admission: RE | Admit: 2024-09-19 | Source: Ambulatory Visit

## 2024-09-19 ENCOUNTER — Encounter

## 2024-10-18 ENCOUNTER — Ambulatory Visit
Admission: RE | Admit: 2024-10-18 | Discharge: 2024-10-18 | Disposition: A | Source: Ambulatory Visit | Attending: Family | Admitting: Family

## 2024-10-18 DIAGNOSIS — N644 Mastodynia: Secondary | ICD-10-CM

## 2024-10-19 ENCOUNTER — Other Ambulatory Visit

## 2024-10-19 ENCOUNTER — Encounter

## 2024-11-13 ENCOUNTER — Ambulatory Visit: Payer: Medicaid Other | Admitting: Podiatry

## 2024-11-20 ENCOUNTER — Ambulatory Visit: Admitting: Family
